# Patient Record
Sex: Male | Born: 1975 | Race: Black or African American | Hispanic: No | Marital: Single | Smoking: Former smoker
Health system: Southern US, Community
[De-identification: ages and names within clinical notes are randomized; demographics above are authoritative.]

## PROBLEM LIST (undated history)

## (undated) DIAGNOSIS — R0600 Dyspnea, unspecified: Secondary | ICD-10-CM

## (undated) DIAGNOSIS — J45909 Unspecified asthma, uncomplicated: Secondary | ICD-10-CM

## (undated) DIAGNOSIS — Z930 Tracheostomy status: Secondary | ICD-10-CM

## (undated) DIAGNOSIS — S2239XA Fracture of one rib, unspecified side, initial encounter for closed fracture: Secondary | ICD-10-CM

## (undated) DIAGNOSIS — C801 Malignant (primary) neoplasm, unspecified: Secondary | ICD-10-CM

## (undated) DIAGNOSIS — J449 Chronic obstructive pulmonary disease, unspecified: Secondary | ICD-10-CM

## (undated) DIAGNOSIS — I10 Essential (primary) hypertension: Secondary | ICD-10-CM

## (undated) DIAGNOSIS — C329 Malignant neoplasm of larynx, unspecified: Secondary | ICD-10-CM

## (undated) DIAGNOSIS — S2249XA Multiple fractures of ribs, unspecified side, initial encounter for closed fracture: Secondary | ICD-10-CM

## (undated) HISTORY — DX: Tracheostomy status: Z93.0

## (undated) HISTORY — DX: Chronic obstructive pulmonary disease, unspecified: J44.9

## (undated) HISTORY — DX: Multiple fractures of ribs, unspecified side, initial encounter for closed fracture: S22.49XA

## (undated) HISTORY — DX: Fracture of one rib, unspecified side, initial encounter for closed fracture: S22.39XA

## (undated) HISTORY — DX: Essential (primary) hypertension: I10

## (undated) HISTORY — PX: NO PAST SURGERIES: SHX2092

## (undated) HISTORY — DX: Malignant neoplasm of larynx, unspecified: C32.9

## (undated) HISTORY — DX: Malignant (primary) neoplasm, unspecified: C80.1

---

## 2009-08-11 ENCOUNTER — Emergency Department (HOSPITAL_COMMUNITY): Admission: EM | Admit: 2009-08-11 | Discharge: 2009-08-11 | Payer: Self-pay | Admitting: Emergency Medicine

## 2009-09-17 ENCOUNTER — Emergency Department (HOSPITAL_BASED_OUTPATIENT_CLINIC_OR_DEPARTMENT_OTHER): Admission: EM | Admit: 2009-09-17 | Discharge: 2009-09-17 | Payer: Self-pay | Admitting: Emergency Medicine

## 2011-04-08 ENCOUNTER — Emergency Department (HOSPITAL_COMMUNITY)
Admission: EM | Admit: 2011-04-08 | Discharge: 2011-04-09 | Disposition: A | Discharge status: Home / Self Care | Payer: Self-pay | Attending: Emergency Medicine | Admitting: Emergency Medicine

## 2011-04-08 DIAGNOSIS — M549 Dorsalgia, unspecified: Secondary | ICD-10-CM | POA: Insufficient documentation

## 2011-04-08 DIAGNOSIS — S21209A Unspecified open wound of unspecified back wall of thorax without penetration into thoracic cavity, initial encounter: Secondary | ICD-10-CM | POA: Insufficient documentation

## 2011-04-09 ENCOUNTER — Emergency Department (HOSPITAL_COMMUNITY): Payer: Self-pay

## 2014-05-19 ENCOUNTER — Encounter (HOSPITAL_COMMUNITY): Payer: Self-pay | Admitting: Emergency Medicine

## 2014-05-19 ENCOUNTER — Emergency Department (HOSPITAL_COMMUNITY): Payer: BC Managed Care – PPO

## 2014-05-19 ENCOUNTER — Emergency Department (HOSPITAL_COMMUNITY)
Admission: EM | Admit: 2014-05-19 | Discharge: 2014-05-19 | Disposition: A | Discharge status: Home / Self Care | Payer: BC Managed Care – PPO | Attending: Emergency Medicine | Admitting: Emergency Medicine

## 2014-05-19 DIAGNOSIS — Z720 Tobacco use: Secondary | ICD-10-CM | POA: Insufficient documentation

## 2014-05-19 DIAGNOSIS — N451 Epididymitis: Secondary | ICD-10-CM | POA: Diagnosis not present

## 2014-05-19 DIAGNOSIS — R1030 Lower abdominal pain, unspecified: Secondary | ICD-10-CM | POA: Diagnosis present

## 2014-05-19 DIAGNOSIS — N50819 Testicular pain, unspecified: Secondary | ICD-10-CM

## 2014-05-19 LAB — CBC WITH DIFFERENTIAL/PLATELET
Basophils Absolute: 0 10*3/uL (ref 0.0–0.1)
Basophils Relative: 1 % (ref 0–1)
Eosinophils Absolute: 0.1 10*3/uL (ref 0.0–0.7)
Eosinophils Relative: 2 % (ref 0–5)
HCT: 45.6 % (ref 39.0–52.0)
Hemoglobin: 16.7 g/dL (ref 13.0–17.0)
Lymphocytes Relative: 49 % — ABNORMAL HIGH (ref 12–46)
Lymphs Abs: 3.7 10*3/uL (ref 0.7–4.0)
MCH: 31.9 pg (ref 26.0–34.0)
MCHC: 36.6 g/dL — ABNORMAL HIGH (ref 30.0–36.0)
MCV: 87.2 fL (ref 78.0–100.0)
Monocytes Absolute: 0.3 10*3/uL (ref 0.1–1.0)
Monocytes Relative: 4 % (ref 3–12)
Neutro Abs: 3.4 10*3/uL (ref 1.7–7.7)
Neutrophils Relative %: 45 % (ref 43–77)
Platelets: 202 10*3/uL (ref 150–400)
RBC: 5.23 MIL/uL (ref 4.22–5.81)
RDW: 13 % (ref 11.5–15.5)
WBC: 7.5 10*3/uL (ref 4.0–10.5)

## 2014-05-19 LAB — BASIC METABOLIC PANEL
Anion gap: 15 (ref 5–15)
BUN: 11 mg/dL (ref 6–23)
CO2: 26 mEq/L (ref 19–32)
Calcium: 9.3 mg/dL (ref 8.4–10.5)
Chloride: 98 mEq/L (ref 96–112)
Creatinine, Ser: 0.7 mg/dL (ref 0.50–1.35)
GFR calc Af Amer: >90 mL/min (ref >90)
GFR calc non Af Amer: >90 mL/min (ref >90)
Glucose, Bld: 98 mg/dL (ref 70–99)
Potassium: 4.4 mEq/L (ref 3.7–5.3)
Sodium: 139 mEq/L (ref 137–147)

## 2014-05-19 LAB — URINALYSIS, ROUTINE W REFLEX MICROSCOPIC
Bilirubin Urine: NEGATIVE
Glucose, UA: NEGATIVE mg/dL
Hgb urine dipstick: NEGATIVE
Ketones, ur: NEGATIVE mg/dL
Leukocytes, UA: NEGATIVE
Nitrite: NEGATIVE
Protein, ur: NEGATIVE mg/dL
Specific Gravity, Urine: 1.006 (ref 1.005–1.030)
Urobilinogen, UA: 0.2 mg/dL (ref 0.0–1.0)
pH: 5.5 (ref 5.0–8.0)

## 2014-05-19 MED ORDER — DOXYCYCLINE HYCLATE 100 MG PO CAPS: 100.0000 mg | ORAL_CAPSULE | Freq: Two times a day (BID) | ORAL | Status: DC

## 2014-05-19 MED ORDER — CEFTRIAXONE SODIUM 250 MG IJ SOLR
250.0000 mg | Freq: Once | INTRAMUSCULAR | Status: AC
  Administered 2014-05-19: 250 mg via INTRAMUSCULAR
  Filled 2014-05-19: qty 250

## 2014-05-19 MED ORDER — KETOROLAC TROMETHAMINE 15 MG/ML IJ SOLN
15.0000 mg | Freq: Once | INTRAMUSCULAR | Status: AC
  Administered 2014-05-19: 15 mg via INTRAVENOUS
  Filled 2014-05-19: qty 1

## 2014-05-19 MED ORDER — LIDOCAINE HCL (PF) 1 % IJ SOLN
0.9000 mL | Freq: Once | INTRAMUSCULAR | Status: AC
  Administered 2014-05-19: 0.9 mL
  Filled 2014-05-19: qty 5

## 2014-05-19 MED ORDER — DOXYCYCLINE HYCLATE 100 MG PO TABS
100.0000 mg | ORAL_TABLET | Freq: Once | ORAL | Status: AC
  Administered 2014-05-19: 100 mg via ORAL
  Filled 2014-05-19: qty 1

## 2014-05-19 NOTE — Discharge Instructions (Signed)
Epididymitis °Epididymitis is a swelling (inflammation) of the epididymis. The epididymis is a cord-like structure along the back part of the testicle. Epididymitis is usually, but not always, caused by infection. This is usually a sudden problem beginning with chills, fever and pain behind the scrotum and in the testicle. There may be swelling and redness of the testicle. °DIAGNOSIS  °Physical examination will reveal a tender, swollen epididymis. Sometimes, cultures are obtained from the urine or from prostate secretions to help find out if there is an infection or if the cause is a different problem. Sometimes, blood work is performed to see if your white blood cell count is elevated and if a germ (bacterial) or viral infection is present. Using this knowledge, an appropriate medicine which kills germs (antibiotic) can be chosen by your caregiver. A viral infection causing epididymitis will most often go away (resolve) without treatment. °HOME CARE INSTRUCTIONS  °· Hot sitz baths for 20 minutes, 4 times per day, may help relieve pain. °· Only take over-the-counter or prescription medicines for pain, discomfort or fever as directed by your caregiver. °· Take all medicines, including antibiotics, as directed. Take the antibiotics for the full prescribed length of time even if you are feeling better. °· It is very important to keep all follow-up appointments. °SEEK IMMEDIATE MEDICAL CARE IF:  °· You have a fever. °· You have pain not relieved with medicines. °· You have any worsening of your problems. °· Your pain seems to come and go. °· You develop pain, redness, and swelling in the scrotum and surrounding areas. °MAKE SURE YOU:  °· Understand these instructions. °· Will watch your condition. °· Will get help right away if you are not doing well or get worse. °Document Released: 07/10/2000 Document Revised: 10/05/2011 Document Reviewed: 05/30/2009 °[employer]® Patient Information ©2015 [employer]. This information  is not intended to replace advice given to you by your health care provider. Make sure you discuss any questions you have with your health care provider. ° °

## 2014-05-19 NOTE — ED Provider Notes (Signed)
CSN: [id number]     Arrival date & time 05/19/14  1543 History   First MD Initiated Contact with Patient 05/19/14 1555     Chief Complaint  Patient presents with  . Groin Pain     (Consider location/radiation/quality/duration/timing/severity/associated sxs/prior Treatment) Patient is a 38 y.o. male presenting with groin pain. The history is provided by the patient. No language interpreter was used.  Groin Pain This is a new problem. Episode onset: 3 days. The problem occurs constantly. The problem has been gradually worsening. Pertinent negatives include no abdominal pain, arthralgias, change in bowel habit, fever, nausea, rash, swollen glands or urinary symptoms. The symptoms are aggravated by walking. He has tried nothing for the symptoms.    History reviewed. No pertinent past medical history. History reviewed. No pertinent past surgical history. History reviewed. No pertinent family history. History  Substance Use Topics  . Smoking status: Current Every Day Smoker    Types: Cigarettes  . Smokeless tobacco: Not on file  . Alcohol Use: Yes    Review of Systems  Constitutional: Negative for fever.  Gastrointestinal: Negative for nausea, abdominal pain, diarrhea, constipation, abdominal distention and change in bowel habit.  Genitourinary: Positive for testicular pain. Negative for dysuria, frequency, hematuria, decreased urine volume, discharge, scrotal swelling, genital sores and penile pain.  Musculoskeletal: Negative for arthralgias and gait problem.  Skin: Negative for color change, rash and wound.  All other systems reviewed and are negative.     Allergies  Review of patient's allergies indicates no known allergies.  Home Medications   Prior to Admission medications   Not on File   BP 132/79  Pulse 90  Temp(Src) 98.3 F (36.8 C)  Resp 16  Ht 5\' 6"  (1.676 m)  Wt 172 lb (78.019 kg)  BMI 27.77 kg/m2  SpO2 98% Physical Exam  Vitals reviewed. Constitutional:  He is oriented to person, place, and time. He appears well-developed and well-nourished.  HENT:  Head: Atraumatic.  Cardiovascular: Normal rate.   Pulmonary/Chest: Effort normal and breath sounds normal.  Abdominal: Soft. He exhibits no distension and no mass. There is no tenderness. Hernia confirmed negative in the right inguinal area and confirmed negative in the left inguinal area.  Genitourinary: Penis normal. Right testis shows no mass, no swelling and no tenderness. Left testis shows swelling and tenderness. Left testis shows no mass. Circumcised.  Equivocal cremasteric bilaterally  Musculoskeletal:       Left hip: Normal.  Lymphadenopathy:       Right: No inguinal adenopathy present.       Left: No inguinal adenopathy present.  Neurological: He is alert and oriented to person, place, and time.  Skin: Skin is warm. No rash noted.    ED Course  Procedures (including critical care time) Labs Review Labs Reviewed  CBC WITH DIFFERENTIAL - Abnormal; Notable for the following:    MCHC 36.6 (*)    Lymphocytes Relative 49 (*)    All other components within normal limits  GC/CHLAMYDIA PROBE AMP  BASIC METABOLIC PANEL  URINALYSIS, ROUTINE W REFLEX MICROSCOPIC    Imaging Review US Scrotum  05/19/2014   CLINICAL DATA:  Left testicular pain x3 days  EXAM: SCROTAL ULTRASOUND  DOPPLER ULTRASOUND OF THE TESTICLES  TECHNIQUE: Complete ultrasound examination of the testicles, epididymis, and other scrotal structures was performed. Color and spectral Doppler ultrasound were also utilized to evaluate blood flow to the testicles.  COMPARISON:  None.  FINDINGS: Right testicle  Measurements: 4.1 x 2.4 x 2.7  cm. No mass or microlithiasis visualized.  Left testicle  Measurements: 4.1 x 2.5 x 3.3 cm. No mass or microlithiasis visualized.  Right epididymis:  Normal in size and appearance.  Left epididymis: Enlarged/heterogeneous, measuring 4.2 x 1.2 x 2.7 cm, with associated hyperemia.  Hydrocele:  None  visualized.  Varicocele:  Present on the left.  Pulsed Doppler interrogation of both testes demonstrates low resistance arterial and venous waveforms bilaterally.  IMPRESSION: Findings compatible with left epididymitis.  No evidence of testicular torsion.  Left varicocele.   Electronically Signed   By: Julian Hy M.D.   On: 05/19/2014 17:10   [place] Art/ven Flow Abd Pelv Doppler  05/19/2014   CLINICAL DATA:  Left testicular pain x3 days  EXAM: SCROTAL ULTRASOUND  DOPPLER ULTRASOUND OF THE TESTICLES  TECHNIQUE: Complete ultrasound examination of the testicles, epididymis, and other scrotal structures was performed. Color and spectral Doppler ultrasound were also utilized to evaluate blood flow to the testicles.  COMPARISON:  None.  FINDINGS: Right testicle  Measurements: 4.1 x 2.4 x 2.7 cm. No mass or microlithiasis visualized.  Left testicle  Measurements: 4.1 x 2.5 x 3.3 cm. No mass or microlithiasis visualized.  Right epididymis:  Normal in size and appearance.  Left epididymis: Enlarged/heterogeneous, measuring 4.2 x 1.2 x 2.7 cm, with associated hyperemia.  Hydrocele:  None visualized.  Varicocele:  Present on the left.  Pulsed Doppler interrogation of both testes demonstrates low resistance arterial and venous waveforms bilaterally.  IMPRESSION: Findings compatible with left epididymitis.  No evidence of testicular torsion.  Left varicocele.   Electronically Signed   By: Julian Hy M.D.   On: 05/19/2014 17:10     EKG Interpretation None      MDM   Final diagnoses:  Testicle pain    38 y/o male with left testicular pain x 3 days. 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 No dysuria or discharge. No high risk sexual activity or concern for STI. Tenderness of posterior testicle. No inguinal hernia palpated. Equivocal cremasteric. Will get labs, testicular US.   [place] with epididymitis, no torsion. Rocephin and doxycycline given.  UA unremarkable. Appropriate for d/c  with PCP f/u. ED return precautions discussed and pt in agreement.   Labs and imaging reviewed in my medical decision making. Pt discussed with my attending, Dr. Regenia Skeeter.   Amparo Bristol, MD 05/20/14 [phone]

## 2014-05-19 NOTE — ED Notes (Signed)
Patient transported to Ultrasound 

## 2014-05-19 NOTE — ED Notes (Signed)
He states hes had sharp L groin pain since Wednesday. Pain increased with movement. Nothing relieves the pain. 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 Denies bowel/bladder changes

## 2014-05-21 LAB — GC/CHLAMYDIA PROBE AMP
CT Probe RNA: NEGATIVE
GC Probe RNA: NEGATIVE

## 2014-05-23 NOTE — ED Provider Notes (Signed)
I saw and evaluated the patient, reviewed the resident's note and I agree with the findings and plan.   EKG Interpretation None       Patient with epididymitis, will treat as if STI with rocephin and doxy. Recommended symptomatic care and f/u.  Ephraim Hamburger, MD 05/23/14 [phone]

## 2014-10-22 ENCOUNTER — Emergency Department (HOSPITAL_BASED_OUTPATIENT_CLINIC_OR_DEPARTMENT_OTHER)
Admission: EM | Admit: 2014-10-22 | Discharge: 2014-10-22 | Disposition: A | Discharge status: Home / Self Care | Payer: BLUE CROSS/BLUE SHIELD | Attending: Emergency Medicine | Admitting: Emergency Medicine

## 2014-10-22 ENCOUNTER — Encounter (HOSPITAL_BASED_OUTPATIENT_CLINIC_OR_DEPARTMENT_OTHER): Payer: Self-pay | Admitting: *Deleted

## 2014-10-22 ENCOUNTER — Emergency Department (HOSPITAL_BASED_OUTPATIENT_CLINIC_OR_DEPARTMENT_OTHER): Payer: BLUE CROSS/BLUE SHIELD

## 2014-10-22 DIAGNOSIS — R52 Pain, unspecified: Secondary | ICD-10-CM

## 2014-10-22 DIAGNOSIS — Z720 Tobacco use: Secondary | ICD-10-CM | POA: Diagnosis not present

## 2014-10-22 DIAGNOSIS — Z792 Long term (current) use of antibiotics: Secondary | ICD-10-CM | POA: Diagnosis not present

## 2014-10-22 DIAGNOSIS — M549 Dorsalgia, unspecified: Secondary | ICD-10-CM | POA: Diagnosis present

## 2014-10-22 DIAGNOSIS — M545 Low back pain: Secondary | ICD-10-CM | POA: Insufficient documentation

## 2014-10-22 MED ORDER — KETOROLAC TROMETHAMINE 30 MG/ML IJ SOLN
60.0000 mg | Freq: Once | INTRAMUSCULAR | Status: AC
  Administered 2014-10-22: 60 mg via INTRAMUSCULAR
  Filled 2014-10-22: qty 2

## 2014-10-22 MED ORDER — OXYCODONE-ACETAMINOPHEN 5-325 MG PO TABS: 2.0000 | ORAL_TABLET | ORAL | Status: DC | PRN

## 2014-10-22 MED ORDER — CYCLOBENZAPRINE HCL 10 MG PO TABS: 10.0000 mg | ORAL_TABLET | Freq: Two times a day (BID) | ORAL | Status: DC | PRN

## 2014-10-22 MED ORDER — IBUPROFEN 600 MG PO TABS: 600.0000 mg | ORAL_TABLET | Freq: Four times a day (QID) | ORAL | Status: DC | PRN

## 2014-10-22 NOTE — ED Provider Notes (Signed)
CSN: [id number]     Arrival date & time 10/22/14  6222 History   First MD Initiated Contact with Patient 10/22/14 [phone]     Chief Complaint  Patient presents with  . Back Pain     HPI Patient presents with 3 four-day history of back pain.  Started after work.  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  Pain is worse with getting up and down and movement.  Patient has no paresthesias or radiation of pain.  Patient denies any urinary or bowel incontinence. History reviewed. No pertinent past medical history. History reviewed. No pertinent past surgical history. History reviewed. No pertinent family history. History  Substance Use Topics  . Smoking status: Current Every Day Smoker    Types: Cigarettes  . Smokeless tobacco: Not on file  . Alcohol Use: Yes    Review of Systems  All other systems reviewed and are negative  Allergies  Review of patient's allergies indicates no known allergies.  Home Medications   Prior to Admission medications   Medication Sig Start Date End Date Taking? Authorizing Provider  cyclobenzaprine (FLEXERIL) 10 MG tablet Take 1 tablet (10 mg total) by mouth 2 (two) times daily as needed for muscle spasms. 10/22/14   Leonard Schwartz, MD  doxycycline (VIBRAMYCIN) 100 MG capsule Take 1 capsule (100 mg total) by mouth 2 (two) times daily. One po bid x 10 days 05/19/14   Sherwood Gambler, MD  ibuprofen (ADVIL,MOTRIN) 600 MG tablet Take 1 tablet (600 mg total) by mouth every 6 (six) hours as needed. 10/22/14   Leonard Schwartz, MD  oxyCODONE-acetaminophen (PERCOCET/ROXICET) 5-325 MG per tablet Take 2 tablets by mouth every 4 (four) hours as needed for severe pain. 10/22/14   Leonard Schwartz, MD   BP 157/98 mmHg  Pulse 94  Temp(Src) 99.2 F (37.3 C) (Oral)  Resp 16  Ht 5' 7.5" (1.715 m)  Wt 187 lb (84.823 kg)  BMI 28.84 kg/m2  SpO2 99% Physical Exam Physical Exam  Nursing note and vitals reviewed. Constitutional: He is oriented to person, place, and time. He  appears well-developed and well-nourished. No distress.  HENT:  Head: Normocephalic and atraumatic.  Eyes: Pupils are equal, round, and reactive to light.  Neck: Normal range of motion.  Cardiovascular: Normal rate and intact distal pulses.   Pulmonary/Chest: No respiratory distress.  Abdominal: Normal appearance. He exhibits no distension.  Musculoskeletal: Tenderness in the lumbosacral region to palpation and movement.  No radiation.  No step-off deformity.   Neurological: He is alert and oriented to person, place, and time. No cranial nerve deficit.  Skin: Skin is warm and dry. No rash noted.  Psychiatric: He has a normal mood and affect. His behavior is normal.   ED Course  Procedures (including critical care time) Labs Review Labs Reviewed - No data to display  Imaging Review Dg Lumbar Spine Complete  10/22/2014   CLINICAL DATA:  Intermittent lumbago. Pain increased after lifting heavy object 3 days prior  EXAM: LUMBAR SPINE - COMPLETE 4+ VIEW  COMPARISON:  None.  FINDINGS: Frontal, lateral, spot lumbosacral lateral, and bilateral oblique views were obtained. The there are 5 non-rib-bearing lumbar type vertebral bodies. There is no fracture or spondylolisthesis. Disc spaces appear intact. There is no appreciable facet arthropathy. Incidental note is made of spina bifida occulta at L5.  IMPRESSION: No fracture or spondylolisthesis.  No appreciable arthropathy.   Electronically Signed   By: Lowella Grip III M.D.   On: 10/22/2014 10:00  MDM   Final diagnoses:  Pain  Low back pain without sciatica, unspecified back pain laterality        Leonard Schwartz, MD 10/22/14 1024

## 2014-10-22 NOTE — Discharge Instructions (Signed)
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
## 2014-10-22 NOTE — ED Notes (Signed)
Patient transported to X-ray 

## 2016-12-07 ENCOUNTER — Emergency Department (HOSPITAL_COMMUNITY): Payer: BLUE CROSS/BLUE SHIELD

## 2016-12-07 ENCOUNTER — Emergency Department (HOSPITAL_COMMUNITY)
Admission: EM | Admit: 2016-12-07 | Discharge: 2016-12-07 | Disposition: A | Discharge status: Home / Self Care | Payer: BLUE CROSS/BLUE SHIELD | Attending: Emergency Medicine | Admitting: Emergency Medicine

## 2016-12-07 ENCOUNTER — Encounter (HOSPITAL_COMMUNITY): Payer: Self-pay | Admitting: Emergency Medicine

## 2016-12-07 DIAGNOSIS — Z79899 Other long term (current) drug therapy: Secondary | ICD-10-CM | POA: Insufficient documentation

## 2016-12-07 DIAGNOSIS — J40 Bronchitis, not specified as acute or chronic: Secondary | ICD-10-CM | POA: Diagnosis not present

## 2016-12-07 DIAGNOSIS — J309 Allergic rhinitis, unspecified: Secondary | ICD-10-CM | POA: Diagnosis not present

## 2016-12-07 DIAGNOSIS — R51 Headache: Secondary | ICD-10-CM | POA: Insufficient documentation

## 2016-12-07 DIAGNOSIS — F17210 Nicotine dependence, cigarettes, uncomplicated: Secondary | ICD-10-CM | POA: Diagnosis not present

## 2016-12-07 DIAGNOSIS — R05 Cough: Secondary | ICD-10-CM | POA: Diagnosis present

## 2016-12-07 LAB — BASIC METABOLIC PANEL
Anion gap: 10 (ref 5–15)
BUN: 17 mg/dL (ref 6–20)
CO2: 26 mmol/L (ref 22–32)
Calcium: 9.3 mg/dL (ref 8.9–10.3)
Chloride: 98 mmol/L — ABNORMAL LOW (ref 101–111)
Creatinine, Ser: 1.04 mg/dL (ref 0.61–1.24)
GFR calc Af Amer: >60 mL/min (ref >60)
GFR calc non Af Amer: >60 mL/min (ref >60)
Glucose, Bld: 90 mg/dL (ref 65–99)
Potassium: 4.4 mmol/L (ref 3.5–5.1)
Sodium: 134 mmol/L — ABNORMAL LOW (ref 135–145)

## 2016-12-07 LAB — CBC
HCT: 48 % (ref 39.0–52.0)
Hemoglobin: 17.4 g/dL — ABNORMAL HIGH (ref 13.0–17.0)
MCH: 32.3 pg (ref 26.0–34.0)
MCHC: 36.3 g/dL — ABNORMAL HIGH (ref 30.0–36.0)
MCV: 89.2 fL (ref 78.0–100.0)
Platelets: 238 10*3/uL (ref 150–400)
RBC: 5.38 MIL/uL (ref 4.22–5.81)
RDW: 13.8 % (ref 11.5–15.5)
WBC: 6.8 10*3/uL (ref 4.0–10.5)

## 2016-12-07 MED ORDER — FLUTICASONE PROPIONATE 50 MCG/ACT NA SUSP: 1.0000 | Freq: Every day | NASAL | 0 refills | Status: DC

## 2016-12-07 MED ORDER — IBUPROFEN 200 MG PO TABS
600.0000 mg | ORAL_TABLET | Freq: Once | ORAL | Status: AC
  Administered 2016-12-07: 600 mg via ORAL
  Filled 2016-12-07: qty 3

## 2016-12-07 MED ORDER — IBUPROFEN 600 MG PO TABS: 600.0000 mg | ORAL_TABLET | Freq: Four times a day (QID) | ORAL | 0 refills | Status: DC | PRN

## 2016-12-07 MED ORDER — CETIRIZINE-PSEUDOEPHEDRINE ER 5-120 MG PO TB12: 1.0000 | ORAL_TABLET | Freq: Every day | ORAL | 0 refills | Status: DC

## 2016-12-07 NOTE — ED Notes (Signed)
Pt verbalizes awakening with headache, generalized cramping at rest, cough that clears in the morning with associated nausea/spitting up saliva/a lot of snot in nose. Pt verbalizes this is ongoing for months.

## 2016-12-07 NOTE — ED Provider Notes (Signed)
[facility] DEPT Provider Note   CSN: [id number] Arrival date & time: 12/07/16  6440     History   Chief Complaint Chief Complaint  Patient presents with  . headache  . Nasal Congestion    HPI Daniel Marshall is a 41 y.o. male.  HPI  41 y.o. male  presents to the Emergency Department today complaining of headache, sinus congestion, cough with reg tinged sputum. Notes x several weeks. Associated headache is in the mornings. Resolves throughout the day without medications. Headache only occurs in the AM. No numbness/tingling. No visual changes. Notes emesis in morning when first waking. None throughout the day. No CP/SOB/ABD pain. No fevers. Pt states that he is able to go to work and cotninue throughout the day without symtoms, but reurnts to home and feels fatigued and cramps up. No other symptoms noted.   History reviewed. No pertinent past medical history.  There are no active problems to display for this patient.   History reviewed. No pertinent surgical history.     Home Medications    Prior to Admission medications   Medication Sig Start Date End Date Taking? Authorizing Provider  cyclobenzaprine (FLEXERIL) 10 MG tablet Take 1 tablet (10 mg total) by mouth 2 (two) times daily as needed for muscle spasms. Patient not taking: Reported on 12/07/2016 10/22/14   Leonard Schwartz, MD  doxycycline (VIBRAMYCIN) 100 MG capsule Take 1 capsule (100 mg total) by mouth 2 (two) times daily. One po bid x 10 days Patient not taking: Reported on 12/07/2016 05/19/14   Sherwood Gambler, MD  ibuprofen (ADVIL,MOTRIN) 600 MG tablet Take 1 tablet (600 mg total) by mouth every 6 (six) hours as needed. Patient not taking: Reported on 12/07/2016 10/22/14   Leonard Schwartz, MD  oxyCODONE-acetaminophen (PERCOCET/ROXICET) 5-325 MG per tablet Take 2 tablets by mouth every 4 (four) hours as needed for severe pain. Patient not taking: Reported on 12/07/2016 10/22/14   Leonard Schwartz, MD    Family History No  family history on file.  Social History Social History  Substance Use Topics  . Smoking status: Current Every Day Smoker    Types: Cigarettes  . Smokeless tobacco: Never Used  . Alcohol use Yes     Allergies   Patient has no known allergies.   Review of Systems Review of Systems ROS reviewed and all are negative for acute change except as noted in the HPI.  Physical Exam Updated Vital Signs BP (!) 152/109 (BP Location: Left Arm)   Pulse 87   Temp 98.7 F (37.1 C) (Oral)   Resp 18   SpO2 98%   Physical Exam  Constitutional: He is oriented to person, place, and time. He appears well-developed and well-nourished. No distress.  HENT:  Head: Normocephalic and atraumatic.  Right Ear: Tympanic membrane, external ear and ear canal normal.  Left Ear: Tympanic membrane, external ear and ear canal normal.  Nose: Nose normal.  Mouth/Throat: Uvula is midline, oropharynx is clear and moist and mucous membranes are normal. No trismus in the jaw. No oropharyngeal exudate, posterior oropharyngeal erythema or tonsillar abscesses.  Eyes: EOM are normal. Pupils are equal, round, and reactive to light.  Neck: Normal range of motion. Neck supple. No tracheal deviation present.  Cardiovascular: Normal rate, regular rhythm, S1 normal, S2 normal, normal heart sounds, intact distal pulses and normal pulses.   Pulmonary/Chest: Effort normal and breath sounds normal. No respiratory distress. He has no decreased breath sounds. He has no wheezes. He has no rhonchi. He has  no rales.  Abdominal: Normal appearance and bowel sounds are normal. There is no tenderness.  Musculoskeletal: Normal range of motion.  Neurological: He is alert and oriented to person, place, and time.  Skin: Skin is warm and dry.  Psychiatric: He has a normal mood and affect. His speech is normal and behavior is normal. Thought content normal.  Nursing note and vitals reviewed.  ED Treatments / Results  Labs (all labs ordered  are listed, but only abnormal results are displayed) Labs Reviewed  CBC - Abnormal; Notable for the following:       Result Value   Hemoglobin 17.4 (*)    MCHC 36.3 (*)    All other components within normal limits  BASIC METABOLIC PANEL    EKG  EKG Interpretation None       Radiology No results found.  Procedures Procedures (including critical care time)  Medications Ordered in ED Medications - No data to display   Initial Impression / Assessment and Plan / ED Course  I have reviewed the triage vital signs and the nursing notes.  Pertinent labs & imaging results that were available during my care of the patient were reviewed by me and considered in my medical decision making (see chart for details).  Final Clinical Impressions(s) / ED Diagnoses  {I have reviewed and evaluated the relevant laboratory values. {I have reviewed and evaluated the relevant imaging studies.  {I have reviewed the relevant previous healthcare records.  {I obtained HPI from historian.   ED Course:  Assessment: Pt is a 41 y.o. male presents with headache, sinus congestion, and productive sputum with hemoptysis x several weeks. No recent travel. Notes smoking 1 pack every two days of cigarrettes. No CP/SOB. No N/V. No fevers. No meds PTA. On exam, pt in NAD. VSS. Afebrile. Lungs CTA, Heart RRR. Abdomen nontender/soft. Labs unremarkable. Pt CXR negative for acute infiltrate. Likely combination of allergic sinusitis causing headache. Patient is without high-risk features of headache including: Sudden onset/thunderclap HA, No similar headache in past, Altered mental status, Accompanying seizure, Headache with exertion, Age > 50, History of immunocompromise, Neck or shoulder pain, Fever, Use of anticoagulation, Family history of spontaneous SAH, Concomitant drug use, Toxic exposure. Patient has a normal complete neurological exam, normal vital signs, normal level of consciousness. Seems to resolve as day  progresses and only occurs in AM. Hemoptysis likely related to smoking. Encouraged smoking cessation. Lungs were CTA. Doubt PE as pt without pleuritic pain, Doubt PE. Pt will be discharged with symptomatic treatment.  Verbalizes understanding and is agreeable with plan. Pt is hemodynamically stable & in NAD prior to dc.  Disposition/Plan:  DC Home Additional Verbal discharge instructions given and discussed with patient.  Pt Instructed to f/u with PCP in the next week for evaluation and treatment of symptoms. Return precautions given Pt acknowledges and agrees with plan  Supervising Physician Gareth Morgan, MD  Final diagnoses:  Allergic sinusitis  Bronchitis    New Prescriptions New Prescriptions   No medications on file     Shary Decamp, Hershal Coria 12/07/16 1425    Gareth Morgan, MD 12/09/16 1306
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
## 2016-12-07 NOTE — ED Triage Notes (Signed)
Patient states for couple months now every morning he wakes up he has headaches. Then having coughing up lots of saliva and here lately he had red streaks in it. Patient states that after he first drinks in the mornings it comes right back up. Then he goes to work and works all day then when gets home and does things around the house he cramps up. Patient reports that he drinks lots of water during the day while working.

## 2017-05-05 ENCOUNTER — Encounter: Payer: Self-pay | Admitting: Family Medicine

## 2017-05-05 ENCOUNTER — Ambulatory Visit (INDEPENDENT_AMBULATORY_CARE_PROVIDER_SITE_OTHER): Payer: BLUE CROSS/BLUE SHIELD | Admitting: Family Medicine

## 2017-05-05 VITALS — BP 136/86 | HR 88 | Temp 98.9°F | Resp 14 | Ht 67.5 in | Wt 181.2 lb

## 2017-05-05 DIAGNOSIS — Z1329 Encounter for screening for other suspected endocrine disorder: Secondary | ICD-10-CM

## 2017-05-05 DIAGNOSIS — Z136 Encounter for screening for cardiovascular disorders: Secondary | ICD-10-CM

## 2017-05-05 DIAGNOSIS — R252 Cramp and spasm: Secondary | ICD-10-CM | POA: Diagnosis not present

## 2017-05-05 DIAGNOSIS — Z1321 Encounter for screening for nutritional disorder: Secondary | ICD-10-CM | POA: Diagnosis not present

## 2017-05-05 DIAGNOSIS — Z131 Encounter for screening for diabetes mellitus: Secondary | ICD-10-CM

## 2017-05-05 MED ORDER — RANITIDINE HCL 150 MG PO TABS: 150.0000 mg | ORAL_TABLET | Freq: Two times a day (BID) | ORAL | 0 refills | Status: DC

## 2017-05-05 MED ORDER — ALBUTEROL SULFATE HFA 108 (90 BASE) MCG/ACT IN AERS: 2.0000 | INHALATION_SPRAY | RESPIRATORY_TRACT | 1 refills | Status: DC | PRN

## 2017-05-05 NOTE — Patient Instructions (Signed)
Nice meeting you!   You will be notified of any abnormal labs and any additional treatment indicated.  Pick-up medications from pharmacy.    Chronic Obstructive Pulmonary Disease Chronic obstructive pulmonary disease (COPD) is a long-term (chronic) lung problem. When you have COPD, it is hard for air to get in and out of your lungs. The way your lungs work will never return to normal. Usually the condition gets worse over time. There are things you can do to keep yourself as healthy as possible. Your doctor may treat your condition with:  Medicines.  Quitting smoking, if you smoke.  Rehabilitation. This may involve a team of specialists.  Oxygen.  Exercise and changes to your diet.  Lung surgery.  Comfort measures (palliative care).  Follow these instructions at home: Medicines  Take over-the-counter and prescription medicines only as told by your doctor.  Talk to your doctor before taking any cough or allergy medicines. You may need to avoid medicines that cause your lungs to be dry. Lifestyle  If you smoke, stop. Smoking makes the problem worse. If you need help quitting, ask your doctor.  Avoid being around things that make your breathing worse. This may include smoke, chemicals, and fumes.  Stay active, but remember to also rest.  Learn and use tips on how to relax.  Make sure you get enough sleep. Most adults need at least 7 hours a night.  Eat healthy foods. Eat smaller meals more often. Rest before meals. Controlled breathing  Learn and use tips on how to control your breathing as told by your doctor. Try: ? Breathing in (inhaling) through your nose for 1 second. Then, pucker your lips and breath out (exhale) through your lips for 2 seconds. ? Putting one hand on your belly (abdomen). Breathe in slowly through your nose for 1 second. Your hand on your belly should move out. Pucker your lips and breathe out slowly through your lips. Your hand on your belly should  move in as you breathe out. Controlled coughing  Learn and use controlled coughing to clear mucus from your lungs. The steps are: 1. Lean your head a little forward. 2. Breathe in deeply. 3. Try to hold your breath for 3 seconds. 4. Keep your mouth slightly open while coughing 2 times. 5. Spit any mucus out into a tissue. 6. Rest and do the steps again 1 or 2 times as needed. General instructions  Make sure you get all the shots (vaccines) that your doctor recommends. Ask your doctor about a flu shot and a pneumonia shot.  Use oxygen therapy and therapy to help improve your lungs (pulmonary rehabilitation) if told by your doctor. If you need home oxygen therapy, ask your doctor if you should buy a tool to measure your oxygen level (oximeter).  Make a COPD action plan with your doctor. This helps you know what to do if you feel worse than usual.  Manage any other conditions you have as told by your doctor.  Avoid going outside when it is very hot, cold, or humid.  Avoid people who have a sickness you can catch (contagious).  Keep all follow-up visits as told by your doctor. This is important. Contact a doctor if:  You cough up more mucus than usual.  There is a change in the color or thickness of the mucus.  It is harder to breathe than usual.  Your breathing is faster than usual.  You have trouble sleeping.  You need to use your medicines  more often than usual.  You have trouble doing your normal activities such as getting dressed or walking around the house. Get help right away if:  You have shortness of breath while resting.  You have shortness of breath that stops you from: ? Being able to talk. ? Doing normal activities.  Your chest hurts for longer than 5 minutes.  Your skin color is more blue than usual.  Your pulse oximeter shows that you have low oxygen for longer than 5 minutes.  You have a fever.  You feel too tired to breathe  normally. Summary  Chronic obstructive pulmonary disease (COPD) is a long-term lung problem.  The way your lungs work will never return to normal. Usually the condition gets worse over time. There are things you can do to keep yourself as healthy as possible.  Take over-the-counter and prescription medicines only as told by your doctor.  If you smoke, stop. Smoking makes the problem worse. This information is not intended to replace advice given to you by your health care provider. Make sure you discuss any questions you have with your health care provider. Document Released: 12/30/2007 Document Revised: 12/19/2015 Document Reviewed: 03/09/2013 Elsevier Interactive Patient Education  2017 [place] Risks of Smoking Smoking cigarettes is very bad for your health. Tobacco smoke has over 200 known poisons in it. It contains the poisonous gases nitrogen oxide and carbon monoxide. There are over 60 chemicals in tobacco smoke that cause cancer. Smoking is difficult to quit because a chemical in tobacco, called nicotine, causes addiction or dependence. When you smoke and inhale, nicotine is absorbed rapidly into the bloodstream through your lungs. Both inhaled and non-inhaled nicotine may be addictive. What are the risks of cigarette smoke? Cigarette smokers have an increased risk of many serious medical problems, including:  Lung cancer.  Lung disease, such as pneumonia, bronchitis, and emphysema.  Chest pain (angina) and heart attack because the heart is not getting enough oxygen.  Heart disease and peripheral blood vessel disease.  High blood pressure (hypertension).  Stroke.  Oral cancer, including cancer of the lip, mouth, or voice box.  Bladder cancer.  Pancreatic cancer.  Cervical cancer.  Pregnancy complications, including premature birth.  Stillbirths and smaller newborn babies, birth defects, and genetic damage to sperm.  Early menopause.  Lower estrogen  level for women.  Infertility.  Facial wrinkles.  Blindness.  Increased risk of broken bones (fractures).  Senile dementia.  Stomach ulcers and internal bleeding.  Delayed wound healing and increased risk of complications during surgery.  Even smoking lightly shortens your life expectancy by several years.  Because of secondhand smoke exposure, children of smokers have an increased risk of the following:  Sudden infant death syndrome (SIDS).  Respiratory infections.  Lung cancer.  Heart disease.  Ear infections.  What are the benefits of quitting? There are many health benefits of quitting smoking. Here are some of them:  Within days of quitting smoking, your risk of having a heart attack decreases, your blood flow improves, and your lung capacity improves. Blood pressure, pulse rate, and breathing patterns start returning to normal soon after quitting.  Within months, your lungs may clear up completely.  Quitting for 10 years reduces your risk of developing lung cancer and heart disease to almost that of a nonsmoker.  People who quit may see an improvement in their overall quality of life.  How do I quit smoking? Smoking is an addiction with both physical and psychological effects,  and longtime habits can be hard to change. Your health care provider can recommend:  Programs and community resources, which may include group support, education, or talk therapy.  Prescription medicines to help reduce cravings.  Nicotine replacement products, such as patches, gum, and nasal sprays. Use these products only as directed. Do not replace cigarette smoking with electronic cigarettes, which are commonly called e-cigarettes. The safety of e-cigarettes is not known, and some may contain harmful chemicals.  A combination of two or more of these methods.  Where to find more information:  American Lung Association: www.lung.org  American Cancer Society:  www.cancer.org Summary  Smoking cigarettes is very bad for your health. Cigarette smokers have an increased risk of many serious medical problems, including several cancers, heart disease, and stroke.  Smoking is an addiction with both physical and psychological effects, and longtime habits can be hard to change.  By stopping right away, you can greatly reduce the risk of medical problems for you and your family.  To help you quit smoking, your health care provider can recommend programs, community resources, prescription medicines, and nicotine replacement products such as patches, gum, and nasal sprays. This information is not intended to replace advice given to you by your health care provider. Make sure you discuss any questions you have with your health care provider. Document Released: 08/20/2004 Document Revised: 07/17/2016 Document Reviewed: 07/17/2016 Elsevier Interactive Patient Education  2017 [employer].

## 2017-05-05 NOTE — Progress Notes (Signed)
Patient ID: Daniel Marshall, male    DOB: 1976-01-20, 41 y.o.   MRN: [id number]  PCP: Scot Jun, FNP  Chief Complaint  Patient presents with  . Establish Care  . Cough  . cramps all over body    Subjective:  HPI Daniel Marshall is a 41 y.o. male presents for establish care . He complains of cramps all over body intermittently at least once day. Drinks lots of water, intakes minimal salt. He has no extremity weakness. Denies chest palpitation. Wake up in the morning with excessive mucus production which causes him to cough and expectorate. He also complains of  abdominal upset occurring almost daily in the mornings. No associated constipation, diarrhea, throat pain, or heartburn. He is current every day smoker and denies any associated wheezing or shortness of breath. Social History   Social History  . Marital status: Single    Spouse name: N/A  . Number of children: N/A  . Years of education: N/A   Occupational History  . Not on file.   Social History Main Topics  . Smoking status: Current Every Day Smoker    Types: Cigarettes  . Smokeless tobacco: Never Used  . Alcohol use Yes  . Drug use: No  . Sexual activity: Not on file   Other Topics Concern  . Not on file   Social History Narrative  . No narrative on file    Family History  Problem Relation Age of Onset  . Family history unknown: Yes   Review of Systems  See history of present illness No Known Allergies  Prior to Admission medications   Medication Sig Start Date End Date Taking? Authorizing Provider  cetirizine-pseudoephedrine (ZYRTEC-D) 5-120 MG tablet Take 1 tablet by mouth daily. Patient not taking: Reported on 05/05/2017 12/07/16   Shary Decamp, PA-C  cyclobenzaprine (FLEXERIL) 10 MG tablet Take 1 tablet (10 mg total) by mouth 2 (two) times daily as needed for muscle spasms. Patient not taking: Reported on 12/07/2016 10/22/14   Leonard Schwartz, MD  fluticasone [facility]) 50 MCG/ACT nasal spray Place 1  spray into both nostrils daily. Patient not taking: Reported on 05/05/2017 12/07/16   Shary Decamp, PA-C  ibuprofen (ADVIL,MOTRIN) 600 MG tablet Take 1 tablet (600 mg total) by mouth every 6 (six) hours as needed. Patient not taking: Reported on 05/05/2017 12/07/16   Shary Decamp, PA-C  oxyCODONE-acetaminophen (PERCOCET/ROXICET) 5-325 MG per tablet Take 2 tablets by mouth every 4 (four) hours as needed for severe pain. Patient not taking: Reported on 12/07/2016 10/22/14   Leonard Schwartz, MD    Past Medical, Surgical Family and Social History reviewed and updated.    Objective:   Today's Vitals   05/05/17 1408  BP: 136/86  Pulse: 88  Resp: 14  Temp: 98.9 F (37.2 C)  TempSrc: Oral  SpO2: 98%  Weight: 181 lb 3.2 oz (82.2 kg)  Height: 5' 7.5" (1.715 m)    Wt Readings from Last 3 Encounters:  05/05/17 181 lb 3.2 oz (82.2 kg)  10/22/14 187 lb (84.8 kg)  05/19/14 172 lb (78 kg)   Physical Exam  Constitutional: He is oriented to person, place, and time. He appears well-developed and well-nourished.  HENT:  Head: Normocephalic and atraumatic.  Nose: Nose normal.  Mouth/Throat: Oropharynx is clear and moist.  Eyes: Pupils are equal, round, and reactive to light. Conjunctivae and EOM are normal.  Neck: Normal range of motion. Neck supple. No thyromegaly present.  Cardiovascular: Normal rate, regular rhythm, normal heart sounds  and intact distal pulses.   Pulmonary/Chest: Effort normal and breath sounds normal.  Abdominal: Soft. Bowel sounds are normal. He exhibits no distension. There is no tenderness.  Musculoskeletal: Normal range of motion.  Lymphadenopathy:    He has no cervical adenopathy.  Neurological: He is alert and oriented to person, place, and time.  Skin: Skin is warm and dry.  Psychiatric: He has a normal mood and affect. His behavior is normal. Judgment and thought content normal.   Assessment & Plan:  1. Cramping of hands-sedimentation rate 2. Encounter for vitamin  deficiency-  mag, phosphorus, CMP,Vitamin D, vitamin B12 level 3. Screening for thyroid disorder-Check a thyroid panel 4. Screening for cardiovascular condition- EKG 12-Lead, negative ischemia or dysrhythmia  5. Screening for diabetes mellitus-- Hemoglobin A1c   Orders Placed This Encounter  Procedures  . Sedimentation rate  . CBC with Differential  . COMPLETE METABOLIC PANEL WITH GFR  . Thyroid Panel With TSH  . Magnesium  . Phosphorus  . VITAMIN D 25 Hydroxy (Vit-D Deficiency, Fractures)  . Vitamin B12  . Hemoglobin A1c  . EKG 12-Lead    Meds ordered this encounter  Medications  . albuterol (PROVENTIL HFA;VENTOLIN HFA) 108 (90 Base) MCG/ACT inhaler    Sig: Inhale 2 puffs into the lungs every 4 (four) hours as needed for wheezing or shortness of breath (cough, shortness of breath or wheezing.).    Dispense:  1 Inhaler    Refill:  1    Order Specific Question:   Supervising Provider    Answer:   Tresa Garter [id number]  . ranitidine (ZANTAC) 150 MG tablet    Sig: Take 1 tablet (150 mg total) by mouth 2 (two) times daily.    Dispense:  60 tablet    Refill:  0    Order Specific Question:   Supervising Provider    Answer:   Tresa Garter [[id number]]    Return for 6 months  Carroll Sage. Kenton Kingfisher, MSN, FNP-C [facility]  [address] Cower [place], [place] [address] [phone]

## 2017-05-06 LAB — COMPLETE METABOLIC PANEL WITH GFR
AG Ratio: 0.8 (calc) — ABNORMAL LOW (ref 1.0–2.5)
ALT: 81 U/L — ABNORMAL HIGH (ref 9–46)
AST: 127 U/L — ABNORMAL HIGH (ref 10–40)
Albumin: 3.4 g/dL — ABNORMAL LOW (ref 3.6–5.1)
Alkaline phosphatase (APISO): 164 U/L — ABNORMAL HIGH (ref 40–115)
BUN: 17 mg/dL (ref 7–25)
CO2: 26 mmol/L (ref 20–32)
Calcium: 8.8 mg/dL (ref 8.6–10.3)
Chloride: 97 mmol/L — ABNORMAL LOW (ref 98–110)
Creat: 0.93 mg/dL (ref 0.60–1.35)
GFR, Est African American: 118 mL/min/{1.73_m2} (ref >=60)
GFR, Est Non African American: 102 mL/min/{1.73_m2} (ref >=60)
Globulin: 4.3 g/dL (calc) — ABNORMAL HIGH (ref 1.9–3.7)
Glucose, Bld: 90 mg/dL (ref 65–99)
Potassium: 5 mmol/L (ref 3.5–5.3)
Sodium: 132 mmol/L — ABNORMAL LOW (ref 135–146)
Total Bilirubin: 1.5 mg/dL — ABNORMAL HIGH (ref 0.2–1.2)
Total Protein: 7.7 g/dL (ref 6.1–8.1)

## 2017-05-06 LAB — CBC WITH DIFFERENTIAL/PLATELET
Basophils Absolute: 98 cells/uL (ref 0–200)
Basophils Relative: 1.6 %
Eosinophils Absolute: 189 cells/uL (ref 15–500)
Eosinophils Relative: 3.1 %
HCT: 46.2 % (ref 38.5–50.0)
Hemoglobin: 16 g/dL (ref 13.2–17.1)
Lymphs Abs: 2739 cells/uL (ref 850–3900)
MCH: 31.4 pg (ref 27.0–33.0)
MCHC: 34.6 g/dL (ref 32.0–36.0)
MCV: 90.8 fL (ref 80.0–100.0)
MPV: 10.3 fL (ref 7.5–12.5)
Monocytes Relative: 10.5 %
Neutro Abs: 2434 cells/uL (ref 1500–7800)
Neutrophils Relative %: 39.9 %
Platelets: 193 10*3/uL (ref 140–400)
RBC: 5.09 10*6/uL (ref 4.20–5.80)
RDW: 13.3 % (ref 11.0–15.0)
Total Lymphocyte: 44.9 %
WBC mixed population: 641 cells/uL (ref 200–950)
WBC: 6.1 10*3/uL (ref 3.8–10.8)

## 2017-05-06 LAB — VITAMIN B12: Vitamin B-12: 742 pg/mL (ref 200–1100)

## 2017-05-06 LAB — HEMOGLOBIN A1C
Hgb A1c MFr Bld: 5 % of total Hgb (ref <5.7)
Mean Plasma Glucose: 97 (calc)
eAG (mmol/L): 5.4 (calc)

## 2017-05-06 LAB — THYROID PANEL WITH TSH
Free Thyroxine Index: 1.8 (ref 1.4–3.8)
T3 Uptake: 38 % — ABNORMAL HIGH (ref 22–35)
T4, Total: 4.8 ug/dL — ABNORMAL LOW (ref 4.9–10.5)
TSH: 1.87 mIU/L (ref 0.40–4.50)

## 2017-05-06 LAB — PHOSPHORUS: Phosphorus: 3.3 mg/dL (ref 2.5–4.5)

## 2017-05-06 LAB — VITAMIN D 25 HYDROXY (VIT D DEFICIENCY, FRACTURES): Vit D, 25-Hydroxy: 12 ng/mL — ABNORMAL LOW (ref 30–100)

## 2017-05-06 LAB — SEDIMENTATION RATE: Sed Rate: 14 mm/h (ref 0–15)

## 2017-05-06 LAB — MAGNESIUM: Magnesium: 1.7 mg/dL (ref 1.5–2.5)

## 2017-05-09 ENCOUNTER — Telehealth: Payer: Self-pay | Admitting: Family Medicine

## 2017-05-09 DIAGNOSIS — R7989 Other specified abnormal findings of blood chemistry: Secondary | ICD-10-CM

## 2017-05-09 DIAGNOSIS — R945 Abnormal results of liver function studies: Principal | ICD-10-CM

## 2017-05-09 MED ORDER — VITAMIN D (ERGOCALCIFEROL) 1.25 MG (50000 UNIT) PO CAPS: 50000.0000 [IU] | ORAL_CAPSULE | ORAL | 3 refills | Status: DC

## 2017-05-09 NOTE — Telephone Encounter (Signed)
Advise patient that his vitamin D level is low and I am prescribing replacement therapy vitamin D 50,000 once weekly x 12 weeks.   Carroll Sage. Kenton Kingfisher, MSN, FNP-C [facility]  [address] C[address] [place], [place] [address] [phone]

## 2017-05-09 NOTE — Telephone Encounter (Signed)
Contact patient to advise his liver enzymes is elevated. Advise that he should avoid alcohol and acetaminophen containing products.  Schedule a abdominal RUQ ultrasound. Also I would like patient to return in two weeks to have liver enzymes repeated. Please have him scheduled for lab visit.  Carroll Sage. Kenton Kingfisher, MSN, FNP-C [facility]  [address] C[address] [place], [place] [address] [phone]

## 2017-05-10 ENCOUNTER — Telehealth: Payer: Self-pay

## 2017-05-10 ENCOUNTER — Encounter: Payer: Self-pay | Admitting: Family Medicine

## 2017-05-10 MED ORDER — VITAMIN D (ERGOCALCIFEROL) 1.25 MG (50000 UNIT) PO CAPS: 50000.0000 [IU] | ORAL_CAPSULE | ORAL | 3 refills | Status: DC

## 2017-05-10 NOTE — Telephone Encounter (Signed)
Patient notified of results and will wait to get a call for the [place]

## 2017-05-11 NOTE — Telephone Encounter (Signed)
Patient notified

## 2017-05-13 ENCOUNTER — Ambulatory Visit (HOSPITAL_COMMUNITY): Admission: RE | Admit: 2017-05-13 | Payer: BLUE CROSS/BLUE SHIELD | Source: Ambulatory Visit

## 2017-05-18 ENCOUNTER — Ambulatory Visit (HOSPITAL_COMMUNITY): Payer: BLUE CROSS/BLUE SHIELD

## 2017-05-19 ENCOUNTER — Emergency Department (HOSPITAL_COMMUNITY): Payer: BLUE CROSS/BLUE SHIELD

## 2017-05-19 ENCOUNTER — Ambulatory Visit (HOSPITAL_COMMUNITY)
Admission: RE | Admit: 2017-05-19 | Discharge: 2017-05-19 | Disposition: A | Discharge status: Home / Self Care | Payer: BLUE CROSS/BLUE SHIELD | Source: Ambulatory Visit | Attending: Family Medicine | Admitting: Family Medicine

## 2017-05-19 ENCOUNTER — Other Ambulatory Visit: Payer: Self-pay

## 2017-05-19 ENCOUNTER — Emergency Department (HOSPITAL_COMMUNITY)
Admission: EM | Admit: 2017-05-19 | Discharge: 2017-05-19 | Disposition: A | Discharge status: Home / Self Care | Payer: BLUE CROSS/BLUE SHIELD | Attending: Emergency Medicine | Admitting: Emergency Medicine

## 2017-05-19 ENCOUNTER — Encounter (HOSPITAL_COMMUNITY): Payer: Self-pay | Admitting: Emergency Medicine

## 2017-05-19 DIAGNOSIS — R0602 Shortness of breath: Secondary | ICD-10-CM | POA: Diagnosis present

## 2017-05-19 DIAGNOSIS — R945 Abnormal results of liver function studies: Principal | ICD-10-CM

## 2017-05-19 DIAGNOSIS — J069 Acute upper respiratory infection, unspecified: Secondary | ICD-10-CM | POA: Diagnosis not present

## 2017-05-19 DIAGNOSIS — K760 Fatty (change of) liver, not elsewhere classified: Secondary | ICD-10-CM | POA: Insufficient documentation

## 2017-05-19 DIAGNOSIS — R05 Cough: Secondary | ICD-10-CM | POA: Insufficient documentation

## 2017-05-19 DIAGNOSIS — R7989 Other specified abnormal findings of blood chemistry: Secondary | ICD-10-CM

## 2017-05-19 HISTORY — DX: Unspecified asthma, uncomplicated: J45.909

## 2017-05-19 LAB — CBC WITH DIFFERENTIAL/PLATELET
Basophils Absolute: 0 10*3/uL (ref 0.0–0.1)
Basophils Relative: 1 %
Eosinophils Absolute: 0.2 10*3/uL (ref 0.0–0.7)
Eosinophils Relative: 3 %
HCT: 43.3 % (ref 39.0–52.0)
Hemoglobin: 15.6 g/dL (ref 13.0–17.0)
Lymphocytes Relative: 43 %
Lymphs Abs: 2.8 10*3/uL (ref 0.7–4.0)
MCH: 32.7 pg (ref 26.0–34.0)
MCHC: 36 g/dL (ref 30.0–36.0)
MCV: 90.8 fL (ref 78.0–100.0)
Monocytes Absolute: 0.4 10*3/uL (ref 0.1–1.0)
Monocytes Relative: 6 %
Neutro Abs: 3.2 10*3/uL (ref 1.7–7.7)
Neutrophils Relative %: 49 %
Platelets: 200 10*3/uL (ref 150–400)
RBC: 4.77 MIL/uL (ref 4.22–5.81)
RDW: 13.6 % (ref 11.5–15.5)
WBC: 6.5 10*3/uL (ref 4.0–10.5)

## 2017-05-19 LAB — COMPREHENSIVE METABOLIC PANEL
ALT: 48 U/L (ref 17–63)
AST: 76 U/L — ABNORMAL HIGH (ref 15–41)
Albumin: 3.2 g/dL — ABNORMAL LOW (ref 3.5–5.0)
Alkaline Phosphatase: 123 U/L (ref 38–126)
Anion gap: 11 (ref 5–15)
BUN: 13 mg/dL (ref 6–20)
CO2: 23 mmol/L (ref 22–32)
Calcium: 8.6 mg/dL — ABNORMAL LOW (ref 8.9–10.3)
Chloride: 99 mmol/L — ABNORMAL LOW (ref 101–111)
Creatinine, Ser: 0.68 mg/dL (ref 0.61–1.24)
GFR calc Af Amer: >60 mL/min (ref >60)
GFR calc non Af Amer: >60 mL/min (ref >60)
Glucose, Bld: 97 mg/dL (ref 65–99)
Potassium: 4.8 mmol/L (ref 3.5–5.1)
Sodium: 133 mmol/L — ABNORMAL LOW (ref 135–145)
Total Bilirubin: 1.2 mg/dL (ref 0.3–1.2)
Total Protein: 7.5 g/dL (ref 6.5–8.1)

## 2017-05-19 LAB — ETHANOL: Alcohol, Ethyl (B): 96 mg/dL — ABNORMAL HIGH (ref <10)

## 2017-05-19 MED ORDER — DOXYCYCLINE HYCLATE 100 MG PO CAPS: 100.0000 mg | ORAL_CAPSULE | Freq: Two times a day (BID) | ORAL | 0 refills | Status: DC

## 2017-05-19 MED ORDER — ALBUTEROL SULFATE HFA 108 (90 BASE) MCG/ACT IN AERS
2.0000 | INHALATION_SPRAY | RESPIRATORY_TRACT | Status: DC | PRN
  Administered 2017-05-19: 2 via RESPIRATORY_TRACT
  Filled 2017-05-19: qty 6.7

## 2017-05-19 MED ORDER — PREDNISONE 20 MG PO TABS: 40.0000 mg | ORAL_TABLET | Freq: Every day | ORAL | 0 refills | Status: DC

## 2017-05-19 MED ORDER — PREDNISONE 20 MG PO TABS
60.0000 mg | ORAL_TABLET | Freq: Once | ORAL | Status: AC
  Administered 2017-05-19: 60 mg via ORAL
  Filled 2017-05-19: qty 3

## 2017-05-19 NOTE — ED Provider Notes (Signed)
[facility] DEPT Provider Note   CSN: [id number] Arrival date & time: 05/19/17  0725     History   Chief Complaint Chief Complaint  Patient presents with  . Shortness of Breath  . Cough    HPI Daniel Marshall is a 41 y.o. male.  HPI Patient presents with shortness of breath and cough. Has had it for the last "minute". Has reportedly seen his primary care doctor for a couple weeks ago. States that she got a test was done today in the hospital. Michela Pitcher the put some gel on his abdomen for it. Appears to be a right upper quadrant ultrasound for elevated LFTs. Has had a cough and states his chest feels tight. He has coughed up some spit. No fevers. He smokes cigarettes. History of asthma when he was a child states he outgrew it. States his chest feels tight. No fevers or chills. No swelling in his legs. does drink alcohol. Has dull chest pain at times with the coughing. Past Medical History:  Diagnosis Date  . Asthma    as a child    There are no active problems to display for this patient.   History reviewed. No pertinent surgical history.     Home Medications    Prior to Admission medications   Medication Sig Start Date End Date Taking? Authorizing Provider  DM-Doxylamine-Acetaminophen (VICKS NYQUIL COLD & FLU) 15-6.25-325 MG CAPS Take 1 capsule by mouth once.   Yes [provider]  Vitamin D, Ergocalciferol, (DRISDOL) 50000 units CAPS capsule Take 1 capsule (50,000 Units total) by mouth every 7 (seven) days. 05/10/17  Yes Scot Jun, FNP  albuterol (PROVENTIL HFA;VENTOLIN HFA) 108 (90 Base) MCG/ACT inhaler Inhale 2 puffs into the lungs every 4 (four) hours as needed for wheezing or shortness of breath (cough, shortness of breath or wheezing.). Patient not taking: Reported on 05/19/2017 05/05/17   Scot Jun, FNP  doxycycline (VIBRAMYCIN) 100 MG capsule Take 1 capsule (100 mg total) by mouth 2 (two) times daily. 05/19/17    Davonna Belling, MD  fluticasone (FLONASE) 50 MCG/ACT nasal spray Place 1 spray into both nostrils daily. Patient not taking: Reported on 05/05/2017 12/07/16   Shary Decamp, PA-C  predniSONE (DELTASONE) 20 MG tablet Take 2 tablets (40 mg total) by mouth daily. 05/20/17   Davonna Belling, MD  ranitidine (ZANTAC) 150 MG tablet Take 1 tablet (150 mg total) by mouth 2 (two) times daily. Patient not taking: Reported on 05/19/2017 05/05/17   Scot Jun, FNP    Family History Family History  Problem Relation Age of Onset  . Family history unknown: Yes    Social History Social History  Substance Use Topics  . Smoking status: Current Every Day Smoker    Types: Cigarettes  . Smokeless tobacco: Never Used  . Alcohol use Yes     Allergies   Patient has no known allergies.   Review of Systems Review of Systems  Constitutional: Negative for appetite change.  HENT: Positive for congestion.   Respiratory: Positive for chest tightness and shortness of breath.   Cardiovascular: Positive for chest pain.  Gastrointestinal: Negative for abdominal pain.  Genitourinary: Negative for flank pain.  Musculoskeletal: Negative for back pain.  Neurological: Negative for weakness and numbness.  Hematological: Negative for adenopathy.  Psychiatric/Behavioral: Negative for confusion.     Physical Exam Updated Vital Signs BP (!) 139/92 (BP Location: Right Arm)   Pulse 91   Temp 99 F (37.2 C) (Oral)  Resp 18   Ht 5\' 7"  (1.702 m)   Wt 82.1 kg (181 lb)   SpO2 97%   BMI 28.35 kg/m   Physical Exam  Constitutional: He appears well-developed.  HENT:  Head: Atraumatic.  Eyes: Pupils are equal, round, and reactive to light.  Neck: Neck supple.  Cardiovascular: Normal rate.   Pulmonary/Chest: Effort normal. He has no wheezes. He has no rales.  Abdominal: Soft. There is no tenderness.  Musculoskeletal: He exhibits no edema.  Neurological: He is alert.  Skin: Skin is warm. Capillary  refill takes less than 2 seconds.     ED Treatments / Results  Labs (all labs ordered are listed, but only abnormal results are displayed) Labs Reviewed  ETHANOL - Abnormal; Notable for the following:       Result Value   Alcohol, Ethyl (B) 96 (*)    All other components within normal limits  COMPREHENSIVE METABOLIC PANEL - Abnormal; Notable for the following:    Sodium 133 (*)    Chloride 99 (*)    Calcium 8.6 (*)    Albumin 3.2 (*)    AST 76 (*)    All other components within normal limits  CBC WITH DIFFERENTIAL/PLATELET    EKG  EKG Interpretation None       Radiology Dg Chest 2 View  Result Date: 05/19/2017 CLINICAL DATA:  Cough and shortness breath for 1 week. EXAM: CHEST  2 VIEW COMPARISON:  12/07/2016 FINDINGS: The heart size and mediastinal contours are within normal limits. Both lungs are clear. No evidence of pneumothorax or pleural effusion. The visualized skeletal structures are unremarkable. IMPRESSION: Negative.  No active cardiopulmonary disease. Electronically Signed   By: Earle Gell M.D.   On: 05/19/2017 08:59   US Abdomen Limited Ruq  Result Date: 05/19/2017 CLINICAL DATA:  Elevated liver function tests. EXAM: ULTRASOUND ABDOMEN LIMITED RIGHT UPPER QUADRANT COMPARISON:  None. FINDINGS: Gallbladder: No gallstones or wall thickening visualized. No sonographic Murphy sign noted by sonographer. Common bile duct: Diameter: 4 mm, within normal limits. Liver: Diffusely increased echogenicity of the hepatic parenchyma, consistent with hepatic steatosis. No focal mass lesion identified. Portal vein is patent on color Doppler imaging with normal direction of blood flow towards the liver. IMPRESSION: No evidence gallstones or biliary dilatation. Hepatic steatosis.  No hepatic mass identified. Electronically Signed   By: Earle Gell M.D.   On: 05/19/2017 08:10    Procedures Procedures (including critical care time)  Medications Ordered in ED Medications  predniSONE  (DELTASONE) tablet 60 mg (not administered)  albuterol (PROVENTIL HFA;VENTOLIN HFA) 108 (90 Base) MCG/ACT inhaler 2 puff (not administered)     Initial Impression / Assessment and Plan / ED Course  I have reviewed the triage vital signs and the nursing notes.  Pertinent labs & imaging results that were available during my care of the patient were reviewed by me and considered in my medical decision making (see chart for details).     Patient with cough and URI symptoms.  Has had it for the last months.  X-ray reassuring.  However with previous history of asthma and the prolonged course of the symptoms we will treat with steroids and antibiotics.  Will follow up with his PCP.  LFTs have improved.  Given inhaler Here.  Final Clinical Impressions(s) / ED Diagnoses   Final diagnoses:  Upper respiratory tract infection, unspecified type    New Prescriptions New Prescriptions   DOXYCYCLINE (VIBRAMYCIN) 100 MG CAPSULE    Take 1 capsule (  100 mg total) by mouth 2 (two) times daily.   PREDNISONE (DELTASONE) 20 MG TABLET    Take 2 tablets (40 mg total) by mouth daily.     Davonna Belling, MD 05/19/17 1330

## 2017-05-19 NOTE — ED Triage Notes (Signed)
Pt complaint of "can't breathe; I'm all stopped up;" complaint continued nasal congestion and cough for a month.

## 2017-05-20 ENCOUNTER — Telehealth: Payer: Self-pay | Admitting: Family Medicine

## 2017-05-20 NOTE — Telephone Encounter (Signed)
Contact patient to advise his recent liver ultrasound was consistent with fatty liver disease likely cause by unhealthy diet and alcohol use. His recent labs from the emergency department showed improvement of liver enzymes. He should reduce intake of fat rich foods and avoid alcohol use to improve liver function. I would like to see him in office sooner than his next scheduled appointment. Have him schedule a follow-up for mid-January.   Carroll Sage. Kenton Kingfisher, MSN, FNP-C [facility]  [address] [place], [place] [address] [phone]

## 2017-05-21 ENCOUNTER — Other Ambulatory Visit (INDEPENDENT_AMBULATORY_CARE_PROVIDER_SITE_OTHER): Payer: BLUE CROSS/BLUE SHIELD

## 2017-05-21 DIAGNOSIS — R7989 Other specified abnormal findings of blood chemistry: Secondary | ICD-10-CM

## 2017-05-21 DIAGNOSIS — R945 Abnormal results of liver function studies: Secondary | ICD-10-CM

## 2017-05-21 NOTE — Telephone Encounter (Signed)
Patient notified of results and will schedule appointment in January.

## 2017-05-27 LAB — HEPATITIS B SURFACE ANTIBODY, QUANTITATIVE: Hepatitis B-Post: <5 m[IU]/mL — ABNORMAL LOW (ref >=10)

## 2017-05-27 LAB — HEPATITIS A ANTIBODY, TOTAL: Hepatitis A AB,Total: NONREACTIVE

## 2017-05-27 LAB — HEPATITIS B SURFACE ANTIGEN: Hepatitis B Surface Ag: NONREACTIVE

## 2017-05-27 LAB — HEPATITIS C VRS RNA DETECT BY PCR-QUAL: Hepatitis C Vrs RNA by PCR-Qual: NOT DETECTED

## 2017-08-11 ENCOUNTER — Ambulatory Visit (INDEPENDENT_AMBULATORY_CARE_PROVIDER_SITE_OTHER): Payer: BLUE CROSS/BLUE SHIELD | Admitting: Family Medicine

## 2017-08-11 ENCOUNTER — Ambulatory Visit (HOSPITAL_COMMUNITY)
Admission: RE | Admit: 2017-08-11 | Discharge: 2017-08-11 | Disposition: A | Discharge status: Home / Self Care | Payer: BLUE CROSS/BLUE SHIELD | Source: Ambulatory Visit | Attending: Family Medicine | Admitting: Family Medicine

## 2017-08-11 ENCOUNTER — Encounter: Payer: Self-pay | Admitting: Family Medicine

## 2017-08-11 VITALS — BP 128/86 | HR 90 | Temp 97.9°F | Resp 16 | Ht 67.0 in | Wt 184.0 lb

## 2017-08-11 DIAGNOSIS — R05 Cough: Secondary | ICD-10-CM

## 2017-08-11 DIAGNOSIS — Z1389 Encounter for screening for other disorder: Secondary | ICD-10-CM

## 2017-08-11 DIAGNOSIS — R059 Cough, unspecified: Secondary | ICD-10-CM

## 2017-08-11 DIAGNOSIS — J9801 Acute bronchospasm: Secondary | ICD-10-CM

## 2017-08-11 LAB — POCT URINALYSIS DIP (DEVICE)
Bilirubin Urine: NEGATIVE
Glucose, UA: NEGATIVE mg/dL
Hgb urine dipstick: NEGATIVE
Ketones, ur: NEGATIVE mg/dL
Leukocytes, UA: NEGATIVE
Nitrite: NEGATIVE
Protein, ur: NEGATIVE mg/dL
Specific Gravity, Urine: 1.015 (ref 1.005–1.030)
Urobilinogen, UA: 1 mg/dL (ref 0.0–1.0)
pH: 6 (ref 5.0–8.0)

## 2017-08-11 NOTE — Progress Notes (Signed)
Patient ID: Daniel Marshall, male    DOB: 11/28/1975, 42 y.o.   MRN: [id number]  PCP: Scot Jun, FNP  Chief Complaint  Patient presents with  . Back Pain    hurts when he coughs  . Flank Pain    Subjective:  HPI Daniel Marshall is a 42 y.o. male, current smoker and history of bronchitis, presents today with a complaint of thoracic pain with coughing. Onset of symptoms two weeks prior. He reports initially experiencing chest tightness with coughing. Cough is non productive and he report intermittent wheezing and shortness of breath. He experienced similar symptoms back in October, when he was treated for URI and prescribed antibiotics. Reports today that he never picked up antibiotic medication. Denies fever, headache, nasal congestion, or sore throat. He has an albuterol inhaler, although has not used it. He continues to smoke in spite of symptoms.  Social History   Socioeconomic History  . Marital status: Single    Spouse name: Not on file  . Number of children: Not on file  . Years of education: Not on file  . Highest education level: Not on file  Social Needs  . Financial resource strain: Not on file  . Food insecurity - worry: Not on file  . Food insecurity - inability: Not on file  . Transportation needs - medical: Not on file  . Transportation needs - non-medical: Not on file  Occupational History  . Not on file  Tobacco Use  . Smoking status: Current Every Day Smoker    Types: Cigarettes  . Smokeless tobacco: Never Used  Substance and Sexual Activity  . Alcohol use: Yes  . Drug use: No  . Sexual activity: Not on file  Other Topics Concern  . Not on file  Social History Narrative  . Not on file    Family History  Family history: Patient denies any known family history of cardiovascular disease or  lung disease,   Review of Systems  HENT: Negative.   Eyes: Negative.   Respiratory: Positive for cough, chest tightness, shortness of breath and wheezing.    Cardiovascular: Negative.   Musculoskeletal: Positive for arthralgias.  Skin: Negative.   Psychiatric/Behavioral: Negative.    No Known Allergies  Prior to Admission medications   Medication Sig Start Date End Date Taking? Authorizing Provider  albuterol (PROVENTIL HFA;VENTOLIN HFA) 108 (90 Base) MCG/ACT inhaler Inhale 2 puffs into the lungs every 4 (four) hours as needed for wheezing or shortness of breath (cough, shortness of breath or wheezing.). 05/05/17  Yes Scot Jun, FNP  fluticasone (FLONASE) 50 MCG/ACT nasal spray Place 1 spray into both nostrils daily. 12/07/16  Yes Shary Decamp, PA-C  predniSONE (DELTASONE) 20 MG tablet Take 2 tablets (40 mg total) by mouth daily. 05/20/17  Yes Davonna Belling, MD  ranitidine (ZANTAC) 150 MG tablet Take 1 tablet (150 mg total) by mouth 2 (two) times daily. 05/05/17  Yes Scot Jun, FNP  Vitamin D, Ergocalciferol, (DRISDOL) 50000 units CAPS capsule Take 1 capsule (50,000 Units total) by mouth every 7 (seven) days. 05/10/17  Yes Scot Jun, FNP  DM-Doxylamine-Acetaminophen (VICKS NYQUIL COLD & FLU) 15-6.25-325 MG CAPS Take 1 capsule by mouth once.    [provider]  doxycycline (VIBRAMYCIN) 100 MG capsule Take 1 capsule (100 mg total) by mouth 2 (two) times daily. Patient not taking: Reported on 08/11/2017 05/19/17   Davonna Belling, MD    Past Medical, Surgical Family and Social History reviewed and updated.  Objective:   Today's Vitals   08/11/17 1523  BP: 128/86  Pulse: 90  Resp: 16  Temp: 97.9 F (36.6 C)  TempSrc: Oral  SpO2: 99%  Weight: 184 lb (83.5 kg)  Height: 5\' 7"  (1.702 m)    Wt Readings from Last 3 Encounters:  08/11/17 184 lb (83.5 kg)  05/19/17 181 lb (82.1 kg)  05/05/17 181 lb 3.2 oz (82.2 kg)    Physical Exam  Constitutional: He is oriented to person, place, and time. He appears well-developed and well-nourished.  HENT:  Head: Normocephalic and atraumatic.  Right Ear:  External ear normal.  Left Ear: External ear normal.  Nose: Nose normal.  Mouth/Throat: Oropharynx is clear and moist.  Eyes: Conjunctivae are normal. Pupils are equal, round, and reactive to light.  Neck: Normal range of motion. Neck supple.  Cardiovascular: Normal rate, regular rhythm, normal heart sounds and intact distal pulses.  Pulmonary/Chest: Effort normal. He has decreased breath sounds. He has no wheezes. He has no rhonchi.  Musculoskeletal: Normal range of motion.  Neurological: He is alert and oriented to person, place, and time.  Skin: Skin is warm and dry.  Psychiatric: He has a normal mood and affect. His behavior is normal. Judgment and thought content normal.   Assessment & Plan:  1. Coughing, 2. Bronchospasm Likely secondary to chronic smoking history and possible COPD. Chest, back , rib pain secondary to persistent coughing. Obtaining a chest x-ray to rule out any pneumonia processes.  Encouraged use of albuterol inhaler for shortness of breath, wheezing, coughing every 4-6 hours 2 puffs as needed. Will trial a short-course of prednisone 40 mg x 5. If symptoms worsen or do not improve, return for care.  Orders Placed This Encounter  Procedures  . DG Chest 2 View  . POCT urinalysis dip (device)    Carroll Sage. Kenton Kingfisher, MSN, FNP-C [facility]  [address] [place], [place] [address] [phone]

## 2017-08-11 NOTE — Patient Instructions (Signed)
Bronchospasm, Adult Bronchospasm is a tightening of the airways going into the lungs. During an episode, it may be harder to breathe. You may cough, and you may make a whistling sound when you breathe (wheeze). This condition often affects people with asthma. What are the causes? This condition is caused by swelling and irritation in the airways. It can be triggered by:  An infection (common).  Seasonal allergies.  An allergic reaction.  Exercise.  Irritants. These include pollution, cigarette smoke, strong odors, aerosol sprays, and paint fumes.  Weather changes. Winds increase molds and pollens in the air. Cold air may cause swelling.  Stress and emotional upset.  What are the signs or symptoms? Symptoms of this condition include:  Wheezing. If the episode was triggered by an allergy, wheezing may start right away or hours later.  Nighttime coughing.  Frequent or severe coughing with a simple cold.  Chest tightness.  Shortness of breath.  Decreased ability to exercise.  How is this diagnosed? This condition is usually diagnosed with a review of your medical history and a physical exam. Tests, such as lung function tests, are sometimes done to look for other conditions. The need for a chest X-ray depends on where the wheezing occurs and whether it is the first time you have wheezed. How is this treated? This condition may be treated with:  Inhaled medicines. These open up the airways and help you breathe. They can be taken with an inhaler or a nebulizer device.  Corticosteroid medicines. These may be given for severe bronchospasm, usually when it is associated with asthma.  Avoiding triggers, such as irritants, infection, or allergies.  Follow these instructions at home: Medicines  Take over-the-counter and prescription medicines only as told by your health care provider.  If you need to use an inhaler or nebulizer to take your medicine, ask your health care  provider to explain how to use it correctly. If you were given a spacer, always use it with your inhaler. Lifestyle  Reduce the number of triggers in your home. To do this: ? Change your heating and air conditioning filter at least once a month. ? Limit your use of fireplaces and wood stoves. ? Do not smoke. Do not allow smoking in your home. ? Avoid using perfumes and fragrances. ? Get rid of pests, such as roaches and mice, and their droppings. ? Remove any mold from your home. ? Keep your house clean and dust free. Use unscented cleaning products. ? Replace carpet with wood, tile, or vinyl flooring. Carpet can trap dander and dust. ? Use allergy-proof pillows, mattress covers, and box spring covers. ? Wash bed sheets and blankets every week in hot water. Dry them in a dryer. ? Use blankets that are made of polyester or cotton. ? Wash your hands often. ? Do not allow pets in your bedroom.  Avoid breathing in cold air when you exercise. General instructions  Have a plan for seeking medical care. Know when to call your health care provider and local emergency services, and where to get emergency care.  Stay up to date on your immunizations.  When you have an episode of bronchospasm, stay calm. Try to relax and breathe more slowly.  If you have asthma, make sure you have an asthma action plan.  Keep all follow-up visits as told by your health care provider. This is important. Contact a health care provider if:  You have muscle aches.  You have chest pain.  The mucus that you   cough up (sputum) changes from clear or white to yellow, green, gray, or bloody.  You have a fever.  Your sputum gets thicker. Get help right away if:  Your wheezing and coughing get worse, even after you take your prescribed medicines.  It gets even harder to breathe.  You develop severe chest pain. Summary  Bronchospasm is a tightening of the airways going into the lungs.  During an episode of  bronchospasm, you may have a harder time breathing. You may cough and make a whistling sound when you breathe (wheeze).  Avoid exposure to triggers such as smoke, dust, mold, animal dander, and fragrances.  When you have an episode of bronchospasm, stay calm. Try to relax and breathe more slowly. This information is not intended to replace advice given to you by your health care provider. Make sure you discuss any questions you have with your health care provider. Document Released: 07/16/2003 Document Revised: 07/09/2016 Document Reviewed: 07/09/2016 Elsevier Interactive Patient Education  2017 Elsevier Inc.  

## 2017-08-12 ENCOUNTER — Telehealth: Payer: Self-pay | Admitting: Family Medicine

## 2017-08-12 NOTE — Telephone Encounter (Signed)
Left a detail vm for patient  

## 2017-08-12 NOTE — Telephone Encounter (Signed)
Patient notified of results.

## 2017-08-12 NOTE — Telephone Encounter (Signed)
Contact patient to advise that his chest x-ray was normal.  Complete all medication as recommended.  If symptoms worsen or do not improve within 2 weeks return for care.  Daniel Marshall. Kenton Kingfisher, MSN, FNP-C [facility]  [address] [place], [place] [address] [phone]

## 2017-08-16 DIAGNOSIS — R05 Cough: Secondary | ICD-10-CM | POA: Insufficient documentation

## 2017-08-16 DIAGNOSIS — R059 Cough, unspecified: Secondary | ICD-10-CM | POA: Insufficient documentation

## 2017-08-16 DIAGNOSIS — J9801 Acute bronchospasm: Secondary | ICD-10-CM | POA: Insufficient documentation

## 2017-10-07 NOTE — Telephone Encounter (Signed)
Medication refilled

## 2017-11-03 ENCOUNTER — Ambulatory Visit: Payer: BLUE CROSS/BLUE SHIELD | Admitting: Family Medicine

## 2017-11-12 ENCOUNTER — Ambulatory Visit: Payer: BLUE CROSS/BLUE SHIELD | Admitting: Family Medicine

## 2017-11-25 ENCOUNTER — Encounter: Payer: Self-pay | Admitting: Family Medicine

## 2017-11-25 ENCOUNTER — Ambulatory Visit (INDEPENDENT_AMBULATORY_CARE_PROVIDER_SITE_OTHER): Payer: BLUE CROSS/BLUE SHIELD | Admitting: Family Medicine

## 2017-11-25 VITALS — BP 132/78 | HR 98 | Temp 99.7°F | Resp 16 | Ht 67.0 in | Wt 180.0 lb

## 2017-11-25 DIAGNOSIS — R0989 Other specified symptoms and signs involving the circulatory and respiratory systems: Secondary | ICD-10-CM | POA: Diagnosis not present

## 2017-11-25 DIAGNOSIS — R252 Cramp and spasm: Secondary | ICD-10-CM

## 2017-11-25 DIAGNOSIS — Z114 Encounter for screening for human immunodeficiency virus [HIV]: Secondary | ICD-10-CM | POA: Diagnosis not present

## 2017-11-25 DIAGNOSIS — F17200 Nicotine dependence, unspecified, uncomplicated: Secondary | ICD-10-CM | POA: Diagnosis not present

## 2017-11-25 DIAGNOSIS — J9801 Acute bronchospasm: Secondary | ICD-10-CM

## 2017-11-25 NOTE — Patient Instructions (Signed)
We discussed your muscle cramps at length, will obtain labs and follow-up by phone with results.  Also, for abnormal right upper lung auscultation will obtain a stat chest x-ray and follow-up after reviewing results. I recommend that you decrease your smoking.  I will provide information for Chantix and nicotine patches for your review.  We can follow-up by phone to discuss options further.

## 2017-11-25 NOTE — Progress Notes (Signed)
Subjective:    Patient ID: Daniel Marshall, male    DOB: September 20, 1975, 42 y.o.   MRN: [id number]  HPI A 42 year old male with a history of childhood asthma and tobacco dependence presents complaining of productive cough with blood-tinged sputum and muscle cramps.  Patient states that productive cough has been occurring over the past several months.  He endorses blood-tinged sputum occasionally.  Patient denies fever, headache, shortness of breath, or wheezing.  He was prescribed an albuterol inhaler several months ago, but is not use medication.  He has not attempted any over-the-counter interventions to alleviate current symptoms.  Patient is a chronic everyday tobacco user.  He typically smokes 1 pack/day.  He is attempt to quit smoking in the past without success. Patient is also complaining of periodic muscle cramps.  He states that cramps occur primarily after work.  He works a very physical job and typically sweats profusely.  Patient hydrates infrequently.  He has attempted sports drinks for this problem without relief.  Past Medical History:  Diagnosis Date  . Asthma    as a child   There is no immunization history on file for this patient. Social History   Socioeconomic History  . Marital status: Single    Spouse name: Not on file  . Number of children: Not on file  . Years of education: Not on file  . Highest education level: Not on file  Occupational History  . Not on file  Social Needs  . Financial resource strain: Not on file  . Food insecurity:    Worry: Not on file    Inability: Not on file  . Transportation needs:    Medical: Not on file    Non-medical: Not on file  Tobacco Use  . Smoking status: Current Every Day Smoker    Types: Cigarettes  . Smokeless tobacco: Never Used  Substance and Sexual Activity  . Alcohol use: Yes  . Drug use: No  . Sexual activity: Not on file  Lifestyle  . Physical activity:    Days per week: Not on file    Minutes per session: Not  on file  . Stress: Not on file  Relationships  . Social connections:    Talks on phone: Not on file    Gets together: Not on file    Attends religious service: Not on file    Active member of club or organization: Not on file    Attends meetings of clubs or organizations: Not on file    Relationship status: Not on file  . Intimate partner violence:    Fear of current or ex partner: Not on file    Emotionally abused: Not on file    Physically abused: Not on file    Forced sexual activity: Not on file  Other Topics Concern  . Not on file  Social History Narrative  . Not on file   No Known Allergies Allergies as of 11/25/2017   No Known Allergies     Medication List        Accurate as of 11/25/17  4:17 PM. Always use your most recent med list.          albuterol 108 (90 Base) MCG/ACT inhaler Commonly known as:  PROVENTIL HFA;VENTOLIN HFA Inhale 2 puffs into the lungs every 4 (four) hours as needed for wheezing or shortness of breath (cough, shortness of breath or wheezing.).   fluticasone 50 MCG/ACT nasal spray Commonly known as:  FLONASE Place 1 spray into  both nostrils daily.   predniSONE 20 MG tablet Commonly known as:  DELTASONE Take 2 tablets (40 mg total) by mouth daily.   ranitidine 150 MG tablet Commonly known as:  ZANTAC Take 1 tablet (150 mg total) by mouth 2 (two) times daily.   Vitamin D (Ergocalciferol) 50000 units Caps capsule Commonly known as:  DRISDOL Take 1 capsule (50,000 Units total) by mouth every 7 (seven) days.      Review of Systems  Constitutional: Negative for fatigue, fever and unexpected weight change.  HENT: Positive for congestion.   Eyes: Negative.   Respiratory: Positive for cough. Negative for apnea and shortness of breath.   Cardiovascular: Negative.   Endocrine: Negative.   Genitourinary: Negative.   Musculoskeletal: Negative.   Hematological: Negative.   Psychiatric/Behavioral: Negative.        Objective:   Physical  Exam  Constitutional: He is oriented to person, place, and time. He appears well-developed and well-nourished.  Eyes: Pupils are equal, round, and reactive to light.  Neck: Normal range of motion.  Cardiovascular: Normal rate, regular rhythm, normal heart sounds and intact distal pulses.  Pulmonary/Chest: Effort normal. He has rhonchi in the right upper field.  Abdominal: Soft. Normal appearance and bowel sounds are normal.  Neurological: He is alert and oriented to person, place, and time.  Skin: Skin is warm and dry.  Psychiatric: He has a normal mood and affect. His behavior is normal. Thought content normal.      BP 132/78 (BP Location: Left Arm, Patient Position: Sitting, Cuff Size: Normal) Comment: manually  Pulse 98   Temp 99.7 F (37.6 C) (Oral)   Resp 16   Ht 5\' 7"  (1.702 m)   Wt 180 lb (81.6 kg)   SpO2 98%   BMI 28.19 kg/m     Assessment & Plan:  Bronchospasm We will follow-up by phone after reviewing chest x-ray.  Will discuss treatment plan further at that time.  May warrant referral to pulmonology for further work-up and evaluation. - DG Chest 2 View; Future  Rhonchi - DG Chest 2 View; Future  Muscle cramps  - Basic Metabolic Panel - CBC with Differential  Tobacco dependence Patient is not interested in smoking cessation at this time. Smoking cessation instruction/counseling given:  counseled patient on the dangers of tobacco use, advised patient to stop smoking, and reviewed strategies to maximize success  Screening for HIV (human immunodeficiency virus) - HIV antibody (with reflex)   RTC: Will follow up by phone with chest xray results  Donia Pounds  MSN, FNP-C [facility] [address] [place], [place] [address] [phone]

## 2017-11-26 ENCOUNTER — Ambulatory Visit (HOSPITAL_COMMUNITY)
Admission: RE | Admit: 2017-11-26 | Discharge: 2017-11-26 | Disposition: A | Discharge status: Home / Self Care | Payer: BLUE CROSS/BLUE SHIELD | Source: Ambulatory Visit | Attending: Family Medicine | Admitting: Family Medicine

## 2017-11-26 DIAGNOSIS — R0989 Other specified symptoms and signs involving the circulatory and respiratory systems: Secondary | ICD-10-CM | POA: Insufficient documentation

## 2017-11-26 DIAGNOSIS — J9801 Acute bronchospasm: Secondary | ICD-10-CM | POA: Diagnosis not present

## 2017-11-26 LAB — CBC WITH DIFFERENTIAL/PLATELET
Basophils Absolute: 0.1 10*3/uL (ref 0.0–0.2)
Basos: 1 %
EOS (ABSOLUTE): 0.1 10*3/uL (ref 0.0–0.4)
Eos: 2 %
Hematocrit: 44.2 % (ref 37.5–51.0)
Hemoglobin: 15.3 g/dL (ref 13.0–17.7)
Immature Grans (Abs): 0 10*3/uL (ref 0.0–0.1)
Immature Granulocytes: 0 %
Lymphocytes Absolute: 3.1 10*3/uL (ref 0.7–3.1)
Lymphs: 37 %
MCH: 32.7 pg (ref 26.6–33.0)
MCHC: 34.6 g/dL (ref 31.5–35.7)
MCV: 94 fL (ref 79–97)
Monocytes Absolute: 0.6 10*3/uL (ref 0.1–0.9)
Monocytes: 7 %
Neutrophils Absolute: 4.4 10*3/uL (ref 1.4–7.0)
Neutrophils: 53 %
Platelets: 159 10*3/uL (ref 150–379)
RBC: 4.68 x10E6/uL (ref 4.14–5.80)
RDW: 15 % (ref 12.3–15.4)
WBC: 8.2 10*3/uL (ref 3.4–10.8)

## 2017-11-26 LAB — BASIC METABOLIC PANEL
BUN/Creatinine Ratio: 16 (ref 9–20)
BUN: 15 mg/dL (ref 6–24)
CO2: 22 mmol/L (ref 20–29)
Calcium: 8.9 mg/dL (ref 8.7–10.2)
Chloride: 98 mmol/L (ref 96–106)
Creatinine, Ser: 0.92 mg/dL (ref 0.76–1.27)
GFR calc Af Amer: 118 mL/min/{1.73_m2} (ref >59)
GFR calc non Af Amer: 102 mL/min/{1.73_m2} (ref >59)
Glucose: 90 mg/dL (ref 65–99)
Potassium: 4.1 mmol/L (ref 3.5–5.2)
Sodium: 135 mmol/L (ref 134–144)

## 2017-11-26 LAB — HIV ANTIBODY (ROUTINE TESTING W REFLEX): HIV Screen 4th Generation wRfx: NONREACTIVE

## 2017-11-29 ENCOUNTER — Telehealth: Payer: Self-pay

## 2017-11-29 NOTE — Telephone Encounter (Signed)
-----   Message from Dorena Dew, [place] sent at 11/27/2017  6:31 AM EDT ----- Regarding: lab results Please inform patient that chest xray and labs are unremarkable, no additional medications warranted. Also, all laboratory tests are negative. Recommend smoking cessation at this point. Continue to titrate cigarette use. Utilize albuterol inhaler every 6 hours as needed for shortness of breath, wheezing, or persistent coughing.  Also, recommend replenishing electrolytes after work with sports drinks (Gatorade or power aid) due to overall physicality of job.   If symptoms persist, please follow up for further work up and evaluation.   Donia Pounds  MSN, FNP-C [facility] [address] [place], [place] [address] [phone]

## 2017-11-29 NOTE — Telephone Encounter (Signed)
Left a vm for patient to callback 

## 2017-12-01 MED ORDER — ALBUTEROL SULFATE HFA 108 (90 BASE) MCG/ACT IN AERS: 2.0000 | INHALATION_SPRAY | RESPIRATORY_TRACT | 1 refills | Status: DC | PRN

## 2017-12-01 NOTE — Telephone Encounter (Signed)
Left a vm for patient to callback 

## 2017-12-01 NOTE — Telephone Encounter (Signed)
-----   Message from Dorena Dew, [place] sent at 11/27/2017  6:31 AM EDT ----- Regarding: lab results Please inform patient that chest xray and labs are unremarkable, no additional medications warranted. Also, all laboratory tests are negative. Recommend smoking cessation at this point. Continue to titrate cigarette use. Utilize albuterol inhaler every 6 hours as needed for shortness of breath, wheezing, or persistent coughing.  Also, recommend replenishing electrolytes after work with sports drinks (Gatorade or power aid) due to overall physicality of job.   If symptoms persist, please follow up for further work up and evaluation.   Donia Pounds  MSN, FNP-C [facility] [address] [place], [place] [address] [phone]

## 2017-12-01 NOTE — Telephone Encounter (Signed)
Patient notified and will pick up inhaler

## 2017-12-01 NOTE — Telephone Encounter (Signed)
-----   Message from Dorena Dew, [place] sent at 11/27/2017  6:31 AM EDT ----- Regarding: lab results Please inform patient that chest xray and labs are unremarkable, no additional medications warranted. Also, all laboratory tests are negative. Recommend smoking cessation at this point. Continue to titrate cigarette use. Utilize albuterol inhaler every 6 hours as needed for shortness of breath, wheezing, or persistent coughing.  Also, recommend replenishing electrolytes after work with sports drinks (Gatorade or power aid) due to overall physicality of job.   If symptoms persist, please follow up for further work up and evaluation.   Donia Pounds  MSN, FNP-C [facility] [address] [place], [place] [address] [phone]

## 2018-01-18 ENCOUNTER — Encounter (HOSPITAL_COMMUNITY): Payer: Self-pay | Admitting: Emergency Medicine

## 2018-01-18 ENCOUNTER — Emergency Department (HOSPITAL_COMMUNITY)
Admission: EM | Admit: 2018-01-18 | Discharge: 2018-01-18 | Disposition: A | Discharge status: Home / Self Care | Payer: BLUE CROSS/BLUE SHIELD | Attending: Emergency Medicine | Admitting: Emergency Medicine

## 2018-01-18 ENCOUNTER — Emergency Department (HOSPITAL_COMMUNITY): Payer: BLUE CROSS/BLUE SHIELD

## 2018-01-18 DIAGNOSIS — J45909 Unspecified asthma, uncomplicated: Secondary | ICD-10-CM | POA: Insufficient documentation

## 2018-01-18 DIAGNOSIS — Y9389 Activity, other specified: Secondary | ICD-10-CM | POA: Insufficient documentation

## 2018-01-18 DIAGNOSIS — Z79899 Other long term (current) drug therapy: Secondary | ICD-10-CM | POA: Insufficient documentation

## 2018-01-18 DIAGNOSIS — Z23 Encounter for immunization: Secondary | ICD-10-CM | POA: Insufficient documentation

## 2018-01-18 DIAGNOSIS — S0101XA Laceration without foreign body of scalp, initial encounter: Secondary | ICD-10-CM

## 2018-01-18 DIAGNOSIS — S0121XA Laceration without foreign body of nose, initial encounter: Secondary | ICD-10-CM

## 2018-01-18 DIAGNOSIS — Y92009 Unspecified place in unspecified non-institutional (private) residence as the place of occurrence of the external cause: Secondary | ICD-10-CM | POA: Diagnosis not present

## 2018-01-18 DIAGNOSIS — T148XXA Other injury of unspecified body region, initial encounter: Secondary | ICD-10-CM | POA: Insufficient documentation

## 2018-01-18 DIAGNOSIS — F17210 Nicotine dependence, cigarettes, uncomplicated: Secondary | ICD-10-CM | POA: Diagnosis not present

## 2018-01-18 DIAGNOSIS — Y998 Other external cause status: Secondary | ICD-10-CM | POA: Diagnosis not present

## 2018-01-18 DIAGNOSIS — S022XXA Fracture of nasal bones, initial encounter for closed fracture: Secondary | ICD-10-CM

## 2018-01-18 LAB — BASIC METABOLIC PANEL
Anion gap: 20 — ABNORMAL HIGH (ref 5–15)
BUN: 10 mg/dL (ref 6–20)
CO2: 17 mmol/L — ABNORMAL LOW (ref 22–32)
Calcium: 8.3 mg/dL — ABNORMAL LOW (ref 8.9–10.3)
Chloride: 96 mmol/L — ABNORMAL LOW (ref 98–111)
Creatinine, Ser: 1.09 mg/dL (ref 0.61–1.24)
GFR calc Af Amer: >60 mL/min (ref >60)
GFR calc non Af Amer: >60 mL/min (ref >60)
Glucose, Bld: 100 mg/dL — ABNORMAL HIGH (ref 70–99)
Potassium: 4 mmol/L (ref 3.5–5.1)
Sodium: 133 mmol/L — ABNORMAL LOW (ref 135–145)

## 2018-01-18 LAB — CBC WITH DIFFERENTIAL/PLATELET
Abs Immature Granulocytes: 0.1 10*3/uL (ref 0.0–0.1)
Basophils Absolute: 0.1 10*3/uL (ref 0.0–0.1)
Basophils Relative: 1 %
Eosinophils Absolute: 0.2 10*3/uL (ref 0.0–0.7)
Eosinophils Relative: 2 %
HCT: 43.3 % (ref 39.0–52.0)
Hemoglobin: 14.9 g/dL (ref 13.0–17.0)
Immature Granulocytes: 1 %
Lymphocytes Relative: 48 %
Lymphs Abs: 4.3 10*3/uL — ABNORMAL HIGH (ref 0.7–4.0)
MCH: 32.9 pg (ref 26.0–34.0)
MCHC: 34.4 g/dL (ref 30.0–36.0)
MCV: 95.6 fL (ref 78.0–100.0)
Monocytes Absolute: 0.7 10*3/uL (ref 0.1–1.0)
Monocytes Relative: 7 %
Neutro Abs: 3.6 10*3/uL (ref 1.7–7.7)
Neutrophils Relative %: 41 %
Platelets: 152 10*3/uL (ref 150–400)
RBC: 4.53 MIL/uL (ref 4.22–5.81)
RDW: 13.2 % (ref 11.5–15.5)
WBC: 8.9 10*3/uL (ref 4.0–10.5)

## 2018-01-18 MED ORDER — ACETAMINOPHEN 500 MG PO TABS: 1000.0000 mg | ORAL_TABLET | Freq: Three times a day (TID) | ORAL | 0 refills | Status: AC

## 2018-01-18 MED ORDER — FENTANYL CITRATE (PF) 100 MCG/2ML IJ SOLN
50.0000 ug | Freq: Once | INTRAMUSCULAR | Status: AC
  Administered 2018-01-18: 50 ug via INTRAVENOUS
  Filled 2018-01-18: qty 2

## 2018-01-18 MED ORDER — TETANUS-DIPHTH-ACELL PERTUSSIS 5-2.5-18.5 LF-MCG/0.5 IM SUSP
0.5000 mL | Freq: Once | INTRAMUSCULAR | Status: AC
  Administered 2018-01-18: 0.5 mL via INTRAMUSCULAR
  Filled 2018-01-18: qty 0.5

## 2018-01-18 MED ORDER — OXYMETAZOLINE HCL 0.05 % NA SOLN: 1.0000 | Freq: Two times a day (BID) | NASAL | 0 refills | Status: DC | PRN

## 2018-01-18 MED ORDER — LIDOCAINE-EPINEPHRINE 2 %-1:200000 IJ SOLN
10.0000 mL | Freq: Once | INTRAMUSCULAR | Status: AC
Start: 2018-01-18 — End: 2018-01-18
  Administered 2018-01-18: 10 mL via INTRADERMAL
  Filled 2018-01-18: qty 20

## 2018-01-18 MED ORDER — HYDROCODONE-ACETAMINOPHEN 5-325 MG PO TABS: 1.0000 | ORAL_TABLET | Freq: Three times a day (TID) | ORAL | 0 refills | Status: AC | PRN
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
## 2018-01-18 NOTE — ED Notes (Addendum)
Irrigated PT's wound with sterile water. Applied moist 4X4 dressing

## 2018-02-06 ENCOUNTER — Encounter (HOSPITAL_COMMUNITY): Payer: Self-pay

## 2018-02-06 ENCOUNTER — Other Ambulatory Visit: Payer: Self-pay

## 2018-02-06 ENCOUNTER — Emergency Department (HOSPITAL_COMMUNITY)
Admission: EM | Admit: 2018-02-06 | Discharge: 2018-02-06 | Disposition: A | Discharge status: Home / Self Care | Payer: BLUE CROSS/BLUE SHIELD | Attending: Emergency Medicine | Admitting: Emergency Medicine

## 2018-02-06 DIAGNOSIS — S0101XD Laceration without foreign body of scalp, subsequent encounter: Secondary | ICD-10-CM | POA: Diagnosis not present

## 2018-02-06 DIAGNOSIS — X58XXXD Exposure to other specified factors, subsequent encounter: Secondary | ICD-10-CM | POA: Diagnosis not present

## 2018-02-06 DIAGNOSIS — Z4802 Encounter for removal of sutures: Secondary | ICD-10-CM | POA: Diagnosis not present

## 2018-02-06 DIAGNOSIS — F17210 Nicotine dependence, cigarettes, uncomplicated: Secondary | ICD-10-CM | POA: Insufficient documentation

## 2018-02-06 DIAGNOSIS — Z79899 Other long term (current) drug therapy: Secondary | ICD-10-CM | POA: Diagnosis not present

## 2018-02-06 NOTE — Discharge Instructions (Signed)
Return here as needed.  Keep the areas clean and dry.

## 2018-02-06 NOTE — ED Provider Notes (Signed)
[facility] EMERGENCY DEPARTMENT Provider Note   CSN: [id number] Arrival date & time: 02/06/18  1112     History   Chief Complaint Chief Complaint  Patient presents with  . Suture / Staple Removal    HPI Daniel Marshall is a 42 y.o. male.  HPI Patient presents to the emergency department for suture removal.  Patient states he had no complications from the sutures.  He states that replaced on June 25.  Patient states that he had no signs of infection or drainage from the wound. Past Medical History:  Diagnosis Date  . Asthma    as a child    Patient Active Problem List   Diagnosis Date Noted  . Coughing 08/16/2017  . Bronchospasm 08/16/2017    History reviewed. No pertinent surgical history.      Home Medications    Prior to Admission medications   Medication Sig Start Date End Date Taking? Authorizing Provider  albuterol (PROVENTIL HFA;VENTOLIN HFA) 108 (90 Base) MCG/ACT inhaler Inhale 2 puffs into the lungs every 4 (four) hours as needed for wheezing or shortness of breath (cough, shortness of breath or wheezing.). 12/01/17   Dorena Dew, FNP  fluticasone (FLONASE) 50 MCG/ACT nasal spray Place 1 spray into both nostrils daily. Patient not taking: Reported on 01/18/2018 12/07/16   Shary Decamp, PA-C  oxymetazoline (AFRIN NASAL SPRAY) 0.05 % nasal spray Place 1 spray into both nostrils 2 (two) times daily as needed (nose bleed). 01/18/18   Fatima Blank, MD  predniSONE (DELTASONE) 20 MG tablet Take 2 tablets (40 mg total) by mouth daily. Patient not taking: Reported on 11/25/2017 05/20/17   Davonna Belling, MD  Pseudoeph-Doxylamine-DM-APAP (NYQUIL PO) Take 1-2 capsules by mouth at bedtime as needed (cold/sleep).    [provider]  ranitidine (ZANTAC) 150 MG tablet Take 1 tablet (150 mg total) by mouth 2 (two) times daily. Patient not taking: Reported on 11/25/2017 05/05/17   Scot Jun, FNP  Vitamin D, Ergocalciferol, (DRISDOL)  50000 units CAPS capsule Take 1 capsule (50,000 Units total) by mouth every 7 (seven) days. Patient not taking: Reported on 11/25/2017 05/10/17   Scot Jun, FNP    Family History Family History  Family history unknown: Yes    Social History Social History   Tobacco Use  . Smoking status: Current Every Day Smoker    Types: Cigarettes  . Smokeless tobacco: Never Used  Substance Use Topics  . Alcohol use: Yes  . Drug use: Yes    Types: Marijuana     Allergies   Patient has no known allergies.   Review of Systems Review of Systems  All other systems negative except as documented in the HPI. All pertinent positives and negatives as reviewed in the HPI. Physical Exam Updated Vital Signs BP 133/88 (BP Location: Right Arm)   Pulse 93   Temp 98.7 F (37.1 C) (Oral)   Resp 18   Ht 5\' 6"  (1.676 m)   Wt 83.9 kg (185 lb)   SpO2 97%   BMI 29.86 kg/m   Physical Exam  Constitutional: He is oriented to person, place, and time. He appears well-developed and well-nourished. No distress.  HENT:  Head: Normocephalic.    Eyes: Pupils are equal, round, and reactive to light.  Pulmonary/Chest: Effort normal.  Neurological: He is alert and oriented to person, place, and time.  Skin: Skin is warm and dry.  Psychiatric: He has a normal mood and affect.  Nursing note and  vitals reviewed.    ED Treatments / Results  Labs (all labs ordered are listed, but only abnormal results are displayed) Labs Reviewed - No data to display  EKG None  Radiology No results found.  Procedures Procedures (including critical care time)  Medications Ordered in ED Medications - No data to display   Initial Impression / Assessment and Plan / ED Course  I have reviewed the triage vital signs and the nursing notes.  Pertinent labs & imaging results that were available during my care of the patient were reviewed by me and considered in my medical decision making (see chart for  details).     SUTURE REMOVAL Performed by: Resa Miner Kennethia Lynes  Consent: Verbal consent obtained. Patient identity confirmed: provided demographic data Time out: Immediately prior to procedure a "time out" was called to verify the correct patient, procedure, equipment, support staff and site/side marked as required.  Location details: Left lateral scalp  Wound Appearance: clean  Sutures/Staples Removed: Running sutures in place.  Facility: sutures placed in this facility Patient tolerance: Patient tolerated the procedure well with no immediate complications.     Final Clinical Impressions(s) / ED Diagnoses   Final diagnoses:  None    ED Discharge Orders    None       Dalia Heading, Hershal Coria 02/06/18 1135    Dorie Rank, MD 02/07/18 [phone]

## 2018-02-06 NOTE — ED Triage Notes (Signed)
Pt states that he was here a few weeks ago and had stitches placed, waited to come have them removed because he "did not feel like they were ready to come out".

## 2018-02-06 NOTE — ED Notes (Signed)
ED Provider at bedside. 

## 2018-03-07 ENCOUNTER — Emergency Department (HOSPITAL_COMMUNITY)
Admission: EM | Admit: 2018-03-07 | Discharge: 2018-03-07 | Disposition: A | Discharge status: Home / Self Care | Payer: BLUE CROSS/BLUE SHIELD | Attending: Emergency Medicine | Admitting: Emergency Medicine

## 2018-03-07 ENCOUNTER — Emergency Department (HOSPITAL_COMMUNITY): Payer: BLUE CROSS/BLUE SHIELD

## 2018-03-07 ENCOUNTER — Encounter (HOSPITAL_COMMUNITY): Payer: Self-pay

## 2018-03-07 ENCOUNTER — Other Ambulatory Visit: Payer: Self-pay

## 2018-03-07 DIAGNOSIS — R0781 Pleurodynia: Secondary | ICD-10-CM | POA: Diagnosis not present

## 2018-03-07 DIAGNOSIS — F17210 Nicotine dependence, cigarettes, uncomplicated: Secondary | ICD-10-CM | POA: Insufficient documentation

## 2018-03-07 DIAGNOSIS — R05 Cough: Secondary | ICD-10-CM | POA: Insufficient documentation

## 2018-03-07 DIAGNOSIS — Z8781 Personal history of (healed) traumatic fracture: Secondary | ICD-10-CM | POA: Diagnosis not present

## 2018-03-07 LAB — CBC WITH DIFFERENTIAL/PLATELET
Basophils Absolute: 0.1 10*3/uL (ref 0.0–0.1)
Basophils Relative: 1 %
Eosinophils Absolute: 0.1 10*3/uL (ref 0.0–0.7)
Eosinophils Relative: 2 %
HCT: 43.4 % (ref 39.0–52.0)
Hemoglobin: 15.6 g/dL (ref 13.0–17.0)
Lymphocytes Relative: 43 %
Lymphs Abs: 3.9 10*3/uL (ref 0.7–4.0)
MCH: 34.1 pg — ABNORMAL HIGH (ref 26.0–34.0)
MCHC: 35.9 g/dL (ref 30.0–36.0)
MCV: 95 fL (ref 78.0–100.0)
Monocytes Absolute: 0.5 10*3/uL (ref 0.1–1.0)
Monocytes Relative: 5 %
Neutro Abs: 4.5 10*3/uL (ref 1.7–7.7)
Neutrophils Relative %: 49 %
Platelets: 170 10*3/uL (ref 150–400)
RBC: 4.57 MIL/uL (ref 4.22–5.81)
RDW: 13.6 % (ref 11.5–15.5)
WBC: 9.1 10*3/uL (ref 4.0–10.5)

## 2018-03-07 LAB — I-STAT CHEM 8, ED
BUN: 10 mg/dL (ref 6–20)
Calcium, Ion: 1.09 mmol/L — ABNORMAL LOW (ref 1.15–1.40)
Chloride: 102 mmol/L (ref 98–111)
Creatinine, Ser: 1 mg/dL (ref 0.61–1.24)
Glucose, Bld: 98 mg/dL (ref 70–99)
HCT: 46 % (ref 39.0–52.0)
Hemoglobin: 15.6 g/dL (ref 13.0–17.0)
Potassium: 4.3 mmol/L (ref 3.5–5.1)
Sodium: 137 mmol/L (ref 135–145)
TCO2: 25 mmol/L (ref 22–32)

## 2018-03-07 LAB — D-DIMER, QUANTITATIVE: D-Dimer, Quant: 0.95 ug/mL-FEU — ABNORMAL HIGH (ref 0.00–0.50)

## 2018-03-07 MED ORDER — IPRATROPIUM-ALBUTEROL 0.5-2.5 (3) MG/3ML IN SOLN
3.0000 mL | Freq: Once | RESPIRATORY_TRACT | Status: AC
  Administered 2018-03-07: 3 mL via RESPIRATORY_TRACT
  Filled 2018-03-07: qty 3

## 2018-03-07 MED ORDER — LIDOCAINE 5 % EX PTCH: 1.0000 | MEDICATED_PATCH | CUTANEOUS | 0 refills | Status: DC

## 2018-03-07 MED ORDER — IOPAMIDOL (ISOVUE-370) INJECTION 76%
INTRAVENOUS | Status: AC
  Filled 2018-03-07: qty 100

## 2018-03-07 MED ORDER — IOPAMIDOL (ISOVUE-370) INJECTION 76%
100.0000 mL | Freq: Once | INTRAVENOUS | Status: AC | PRN
  Administered 2018-03-07: 87 mL via INTRAVENOUS

## 2018-03-07 MED ORDER — METHOCARBAMOL 500 MG PO TABS: 500.0000 mg | ORAL_TABLET | Freq: Two times a day (BID) | ORAL | 0 refills | Status: DC

## 2018-03-07 NOTE — ED Notes (Signed)
Patient transported to X-ray 

## 2018-03-07 NOTE — Discharge Instructions (Signed)
You can take Tylenol or Ibuprofen as directed for pain. You can alternate Tylenol and Ibuprofen every 4 hours. If you take Tylenol at 1pm, then you can take Ibuprofen at 5pm. Then you can take Tylenol again at 9pm.   Take Robaxin as prescribed. This medication will make you drowsy so do not drive or drink alcohol when taking it.  Use Lidocaine Patches as directed.   Follow-up with your primary care doctor next 2 to 4 days for further evaluation.  Return to emergency department for any worsening pain, fever, difficulty breathing, nausea/vomiting or any other worsening or concerning symptoms.
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
## 2018-03-07 NOTE — ED Triage Notes (Signed)
Pt c/o left sided Rib pain, and worsens with cough. Pt reports that pain started on rt side. 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

## 2018-03-08 ENCOUNTER — Ambulatory Visit (INDEPENDENT_AMBULATORY_CARE_PROVIDER_SITE_OTHER): Payer: BLUE CROSS/BLUE SHIELD | Admitting: Family Medicine

## 2018-03-08 ENCOUNTER — Encounter: Payer: Self-pay | Admitting: Family Medicine

## 2018-03-08 VITALS — BP 144/90 | HR 80 | Temp 98.7°F | Ht 66.0 in | Wt 179.0 lb

## 2018-03-08 DIAGNOSIS — Z09 Encounter for follow-up examination after completed treatment for conditions other than malignant neoplasm: Secondary | ICD-10-CM | POA: Diagnosis not present

## 2018-03-08 DIAGNOSIS — Z0289 Encounter for other administrative examinations: Secondary | ICD-10-CM | POA: Diagnosis not present

## 2018-03-08 DIAGNOSIS — I10 Essential (primary) hypertension: Secondary | ICD-10-CM | POA: Diagnosis not present

## 2018-03-08 DIAGNOSIS — S2249XA Multiple fractures of ribs, unspecified side, initial encounter for closed fracture: Secondary | ICD-10-CM | POA: Diagnosis not present
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
## 2018-03-17 ENCOUNTER — Telehealth: Payer: Self-pay

## 2018-03-18 NOTE — Telephone Encounter (Signed)
Patient spoke with the provider regarding letter.

## 2018-03-20 ENCOUNTER — Encounter: Payer: Self-pay | Admitting: Family Medicine

## 2018-03-21 NOTE — Telephone Encounter (Signed)
Patient pick up paperwork today and it was also faxed to Disability.

## 2018-04-12 ENCOUNTER — Encounter: Payer: Self-pay | Admitting: Family Medicine

## 2018-04-12 ENCOUNTER — Ambulatory Visit (INDEPENDENT_AMBULATORY_CARE_PROVIDER_SITE_OTHER): Payer: BLUE CROSS/BLUE SHIELD | Admitting: Family Medicine

## 2018-04-12 VITALS — BP 155/80 | HR 90 | Temp 98.6°F | Ht 66.0 in | Wt 176.0 lb

## 2018-04-12 DIAGNOSIS — Z09 Encounter for follow-up examination after completed treatment for conditions other than malignant neoplasm: Secondary | ICD-10-CM | POA: Diagnosis not present

## 2018-04-12 DIAGNOSIS — S2243XG Multiple fractures of ribs, bilateral, subsequent encounter for fracture with delayed healing: Secondary | ICD-10-CM | POA: Diagnosis not present

## 2018-04-12 DIAGNOSIS — I10 Essential (primary) hypertension: Secondary | ICD-10-CM | POA: Diagnosis not present

## 2018-04-12 NOTE — Progress Notes (Signed)
Follow Up  Subjective:    Patient ID: Daniel Marshall, male    DOB: Jul 06, 1976, 42 y.o.   MRN: [id number]   Chief Complaint  Patient presents with  . Follow-up    paperwork, ribs fracture    HPI  Daniel Marshall is a 42 year old male with a past medical history of Broken Ribs, and Asthma. He is here today for follow up.   Current Status: Since his last office visit, he reports moderate chest discomfort with breathing. He recently suffered recurrence of broken ribs and is presently on short term disability from work.    He denies fevers, chills, fatigue, recent infections, weight loss, and night sweats. He has not had any headaches, visual changes, dizziness, and falls. No heart palpitations, and cough reported. No reports of GI problems such as nausea, vomiting, diarrhea, and constipation. He has no reports of blood in stools, dysuria and hematuria. No depression or anxiety reported.  Past Medical History:  Diagnosis Date  . Asthma    as a child  . Broken ribs     Family History  Family history unknown: Yes    Social History   Socioeconomic History  . Marital status: Single    Spouse name: Not on file  . Number of children: Not on file  . Years of education: Not on file  . Highest education level: Not on file  Occupational History  . Not on file  Social Needs  . Financial resource strain: Not on file  . Food insecurity:    Worry: Not on file    Inability: Not on file  . Transportation needs:    Medical: Not on file    Non-medical: Not on file  Tobacco Use  . Smoking status: Current Every Day Smoker    Types: Cigarettes  . Smokeless tobacco: Never Used  Substance and Sexual Activity  . Alcohol use: Yes  . Drug use: Yes    Types: Marijuana  . Sexual activity: Not on file  Lifestyle  . Physical activity:    Days per week: Not on file    Minutes per session: Not on file  . Stress: Not on file  Relationships  . Social connections:    Talks on phone: Not on file    Gets  together: Not on file    Attends religious service: Not on file    Active member of club or organization: Not on file    Attends meetings of clubs or organizations: Not on file    Relationship status: Not on file  . Intimate partner violence:    Fear of current or ex partner: Not on file    Emotionally abused: Not on file    Physically abused: Not on file    Forced sexual activity: Not on file  Other Topics Concern  . Not on file  Social History Narrative  . Not on file    History reviewed. No pertinent surgical history.  Immunization History  Administered Date(s) Administered  . Tdap 01/18/2018    Current Meds  Medication Sig  . albuterol (PROVENTIL HFA;VENTOLIN HFA) 108 (90 Base) MCG/ACT inhaler Inhale 2 puffs into the lungs every 4 (four) hours as needed for wheezing or shortness of breath (cough, shortness of breath or wheezing.).  Marland Kitchen lidocaine (LIDODERM) 5 % Place 1 patch onto the skin daily. Remove & Discard patch within 12 hours or as directed by MD  . methocarbamol (ROBAXIN) 500 MG tablet Take 1 tablet (500 mg total) by  mouth 2 (two) times daily.    No Known Allergies  BP (!) 155/80   Pulse 90   Temp 98.6 F (37 C) (Oral)   Ht 5\' 6"  (1.676 m)   Wt 176 lb (79.8 kg)   SpO2 99%   BMI 28.41 kg/m    Review of Systems  Respiratory: Positive for shortness of breath (occasional ).   Cardiovascular:       Chest discomfort   Objective:   Physical Exam  Constitutional: He appears well-developed and well-nourished.  Cardiovascular: Normal rate, regular rhythm, normal heart sounds and intact distal pulses.  Pulmonary/Chest: Effort normal and breath sounds normal. Wheezes: right lung lobes.  Psychiatric: He has a normal mood and affect. His behavior is normal. Judgment and thought content normal.  Nursing note reviewed.  Assessment & Plan:   1. Multiple closed fractures of ribs of both sides with delayed healing, subsequent encounter Improving. His ribs continues  to heal, with moderate pain upon breathing. He continues to avoid heavy lifting as advised. We will continue to monitor.   2. Hypertension, unspecified type Blood pressure is at 142/86 today. We will continue to monitor. He will continue to decrease high sodium intake, excessive alcohol intake, increase potassium intake, smoking cessation, and increase physical activity of at least 30 minutes of cardio activity daily. He will continue to follow Heart Healthy or DASH diet.  3. Follow up He will follow up in 3 weeks.   No orders of the defined types were placed in this encounter.  Kathe Becton,  MSN, FNP-C [facility] [address] [place], [place] [address] [phone]

## 2018-05-03 ENCOUNTER — Ambulatory Visit (INDEPENDENT_AMBULATORY_CARE_PROVIDER_SITE_OTHER): Payer: BLUE CROSS/BLUE SHIELD | Admitting: Family Medicine

## 2018-05-03 ENCOUNTER — Encounter: Payer: Self-pay | Admitting: Family Medicine

## 2018-05-03 VITALS — BP 126/84 | HR 90 | Temp 97.9°F | Ht 66.0 in | Wt 179.2 lb

## 2018-05-03 DIAGNOSIS — Z09 Encounter for follow-up examination after completed treatment for conditions other than malignant neoplasm: Secondary | ICD-10-CM | POA: Diagnosis not present

## 2018-05-03 DIAGNOSIS — I10 Essential (primary) hypertension: Secondary | ICD-10-CM

## 2018-05-03 DIAGNOSIS — S2243XG Multiple fractures of ribs, bilateral, subsequent encounter for fracture with delayed healing: Secondary | ICD-10-CM | POA: Diagnosis not present

## 2018-05-03 NOTE — Progress Notes (Signed)
Follow Up  Subjective:    Patient ID: Daniel Marshall, male    DOB: Jul 30, 1975, 42 y.o.   MRN: [id number]   Chief Complaint  Patient presents with  . Follow-up    rib fracures     HPI Daniel Marshall is a 42 year old male with a past medical history of Broken Ribs and Asthma. He is here today for follow up.   Current Status: Since his last office visit, he states that he continues to experience moderate pain/discomfort in his left rib/flank area.   He denies fevers, chills, fatigue, recent infections, weight loss, and night sweats. He has not had any headaches, visual changes, dizziness, and falls. No chest pain, heart palpitations, cough and shortness of breath reported. No reports of GI problems such as nausea, vomiting, diarrhea, and constipation. He has no reports of blood in stools, dysuria and hematuria. No depression or anxiety reported.   Past Medical History:  Diagnosis Date  . Asthma    as a child  . Broken ribs     Family History  Family history unknown: Yes    Social History   Socioeconomic History  . Marital status: Single    Spouse name: Not on file  . Number of children: Not on file  . Years of education: Not on file  . Highest education level: Not on file  Occupational History  . Not on file  Social Needs  . Financial resource strain: Not on file  . Food insecurity:    Worry: Not on file    Inability: Not on file  . Transportation needs:    Medical: Not on file    Non-medical: Not on file  Tobacco Use  . Smoking status: Current Every Day Smoker    Types: Cigarettes  . Smokeless tobacco: Never Used  Substance and Sexual Activity  . Alcohol use: Yes  . Drug use: Yes    Types: Marijuana  . Sexual activity: Not on file  Lifestyle  . Physical activity:    Days per week: Not on file    Minutes per session: Not on file  . Stress: Not on file  Relationships  . Social connections:    Talks on phone: Not on file    Gets together: Not on file    Attends  religious service: Not on file    Active member of club or organization: Not on file    Attends meetings of clubs or organizations: Not on file    Relationship status: Not on file  . Intimate partner violence:    Fear of current or ex partner: Not on file    Emotionally abused: Not on file    Physically abused: Not on file    Forced sexual activity: Not on file  Other Topics Concern  . Not on file  Social History Narrative  . Not on file    History reviewed. No pertinent surgical history.  Immunization History  Administered Date(s) Administered  . Tdap 01/18/2018    Current Meds  Medication Sig  . albuterol (PROVENTIL HFA;VENTOLIN HFA) 108 (90 Base) MCG/ACT inhaler Inhale 2 puffs into the lungs every 4 (four) hours as needed for wheezing or shortness of breath (cough, shortness of breath or wheezing.).  Marland Kitchen lidocaine (LIDODERM) 5 % Place 1 patch onto the skin daily. Remove & Discard patch within 12 hours or as directed by MD  . methocarbamol (ROBAXIN) 500 MG tablet Take 1 tablet (500 mg total) by mouth 2 (two) times daily.  No Known Allergies  BP 126/84 (BP Location: Left Arm, Patient Position: Sitting, Cuff Size: Large)   Pulse 90   Temp 97.9 F (36.6 C) (Oral)   Ht 5\' 6"  (1.676 m)   Wt 179 lb 3.2 oz (81.3 kg)   SpO2 96%   BMI 28.92 kg/m   Review of Systems  Constitutional: Negative.   HENT: Negative.   Respiratory: Negative.   Cardiovascular: Negative.   Genitourinary: Positive for flank pain (left flank pain; r/t hx of broken ribs).  Musculoskeletal: Positive for arthralgias.       Left rib pain  Skin: Negative.   Neurological: Negative.   Psychiatric/Behavioral: Negative.    Objective:   Physical Exam  Constitutional: He is oriented to person, place, and time. He appears well-developed and well-nourished.  Cardiovascular: Normal rate, regular rhythm, normal heart sounds and intact distal pulses.  Pulmonary/Chest: Effort normal and breath sounds normal.   Abdominal: Soft. Bowel sounds are normal.  Musculoskeletal: He exhibits tenderness (left rib area).  Neurological: He is alert and oriented to person, place, and time.  Skin: Skin is warm and dry.  Psychiatric: He has a normal mood and affect. His behavior is normal. Judgment and thought content normal.  Nursing note and vitals reviewed.  Assessment & Plan:   1. Multiple closed fractures of ribs of both sides with delayed healing, subsequent encounter We will repeat Chest X-ray within a week.  - DG Chest 2 View; Future  2. Hypertension, unspecified type Blood pressure is stable at 126/84 today. She will continue to decrease high sodium intake, excessive alcohol intake, increase potassium intake, smoking cessation, and increase physical activity of at least 30 minutes of cardio activity daily. She will continue to follow Heart Healthy or DASH diet.  3. Follow up He will follow up in 1 month.  No orders of the defined types were placed in this encounter.   Kathe Becton,  MSN, FNP-C Patient [facility] [address] [place], [place] [address] [phone]

## 2018-05-05 ENCOUNTER — Ambulatory Visit (HOSPITAL_COMMUNITY)
Admission: RE | Admit: 2018-05-05 | Discharge: 2018-05-05 | Disposition: A | Discharge status: Home / Self Care | Payer: BLUE CROSS/BLUE SHIELD | Source: Ambulatory Visit | Attending: Family Medicine | Admitting: Family Medicine

## 2018-05-05 DIAGNOSIS — S2243XG Multiple fractures of ribs, bilateral, subsequent encounter for fracture with delayed healing: Secondary | ICD-10-CM | POA: Diagnosis not present

## 2018-05-05 DIAGNOSIS — X58XXXD Exposure to other specified factors, subsequent encounter: Secondary | ICD-10-CM | POA: Insufficient documentation

## 2018-05-06 ENCOUNTER — Telehealth: Payer: Self-pay

## 2018-05-06 NOTE — Telephone Encounter (Signed)
Patient notified and will come by and pick up letter.

## 2018-05-06 NOTE — Telephone Encounter (Signed)
Patient states that he took his xray yesterday and they told him that his ribs are still not healed. Patient states that he is suppose to go back to work on Monday and wants to be out because he is still not healed. Please advise

## 2018-06-03 ENCOUNTER — Ambulatory Visit (INDEPENDENT_AMBULATORY_CARE_PROVIDER_SITE_OTHER): Payer: Self-pay | Admitting: Family Medicine

## 2018-06-03 ENCOUNTER — Encounter: Payer: Self-pay | Admitting: Family Medicine

## 2018-06-03 VITALS — BP 132/86 | HR 92 | Temp 98.2°F | Ht 66.0 in | Wt 184.0 lb

## 2018-06-03 DIAGNOSIS — S2243XG Multiple fractures of ribs, bilateral, subsequent encounter for fracture with delayed healing: Secondary | ICD-10-CM

## 2018-06-03 DIAGNOSIS — R0781 Pleurodynia: Secondary | ICD-10-CM

## 2018-06-03 DIAGNOSIS — R0789 Other chest pain: Secondary | ICD-10-CM

## 2018-06-03 DIAGNOSIS — Z09 Encounter for follow-up examination after completed treatment for conditions other than malignant neoplasm: Secondary | ICD-10-CM

## 2018-06-03 DIAGNOSIS — R059 Cough, unspecified: Secondary | ICD-10-CM

## 2018-06-03 DIAGNOSIS — R0602 Shortness of breath: Secondary | ICD-10-CM

## 2018-06-03 DIAGNOSIS — I10 Essential (primary) hypertension: Secondary | ICD-10-CM

## 2018-06-03 DIAGNOSIS — R05 Cough: Secondary | ICD-10-CM

## 2018-06-03 NOTE — Progress Notes (Signed)
Follow Up  Subjective:    Patient ID: Daniel Marshall, male    DOB: 13-Jul-1976, 42 y.o.   MRN: [id number]   Chief Complaint  Patient presents with  . Follow-up    rib fracture   HPI  Daniel Marshall is a 42 year old male with a past medical history of Broken Ribs and Asthma. He is here today for follow up.   Current Status: Since his last office visit, he continues to have mild rib pain when coughing. No chest pain, heart palpitations, cough and shortness of breath reported. He reports that his rib pain/discomfort is improving. He denies visual changes, chest pain, cough, shortness of breath, heart palpitations, and falls. He has occasionally headaches and dizziness with position changes. Denies severe headaches, confusion, seizures, double vision, and blurred vision, nausea and vomiting.  He denies fevers, chills, fatigue, recent infections, weight loss, and night sweats. He has not had any and falls. No reports of GI problems such as diarrhea, and constipation. He has no reports of blood in stools, dysuria and hematuria. No depression or anxiety reported.   Past Medical History:  Diagnosis Date  . Asthma    as a child  . Broken ribs   . Hypertension    Family History  Family history unknown: Yes     Social History   Socioeconomic History  . Marital status: Single    Spouse name: Not on file  . Number of children: Not on file  . Years of education: Not on file  . Highest education level: Not on file  Occupational History  . Not on file  Social Needs  . Financial resource strain: Not on file  . Food insecurity:    Worry: Not on file    Inability: Not on file  . Transportation needs:    Medical: Not on file    Non-medical: Not on file  Tobacco Use  . Smoking status: Current Every Day Smoker    Types: Cigarettes  . Smokeless tobacco: Never Used  Substance and Sexual Activity  . Alcohol use: Yes  . Drug use: Yes    Types: Marijuana  . Sexual activity: Not on file  Lifestyle   . Physical activity:    Days per week: Not on file    Minutes per session: Not on file  . Stress: Not on file  Relationships  . Social connections:    Talks on phone: Not on file    Gets together: Not on file    Attends religious service: Not on file    Active member of club or organization: Not on file    Attends meetings of clubs or organizations: Not on file    Relationship status: Not on file  . Intimate partner violence:    Fear of current or ex partner: Not on file    Emotionally abused: Not on file    Physically abused: Not on file    Forced sexual activity: Not on file  Other Topics Concern  . Not on file  Social History Narrative  . Not on file    No past surgical history on file.  Immunization History  Administered Date(s) Administered  . Tdap 01/18/2018    Current Meds  Medication Sig  . albuterol (PROVENTIL HFA;VENTOLIN HFA) 108 (90 Base) MCG/ACT inhaler Inhale 2 puffs into the lungs every 4 (four) hours as needed for wheezing or shortness of breath (cough, shortness of breath or wheezing.).  Marland Kitchen lidocaine (LIDODERM) 5 % Place 1 patch  onto the skin daily. Remove & Discard patch within 12 hours or as directed by MD  . methocarbamol (ROBAXIN) 500 MG tablet Take 1 tablet (500 mg total) by mouth 2 (two) times daily.    No Known Allergies  BP 132/86 (BP Location: Left Arm, Patient Position: Sitting, Cuff Size: Small)   Pulse 92   Temp 98.2 F (36.8 C) (Oral)   Ht 5\' 6"  (1.676 m)   Wt 184 lb (83.5 kg)   SpO2 98%   BMI 29.70 kg/m   Review of Systems  Constitutional: Negative.   HENT: Negative.   Respiratory: Negative.   Cardiovascular: Positive for chest pain (r/t rib fractures).  Gastrointestinal: Negative.   Endocrine: Negative.   Genitourinary: Negative.   Musculoskeletal: Negative.        Rib pain. R>L  Skin: Negative.   Allergic/Immunologic: Negative.   Neurological: Negative.   Hematological: Negative.   Psychiatric/Behavioral: Negative.     Objective:   Physical Exam  Constitutional: He is oriented to person, place, and time. He appears well-developed and well-nourished.  HENT:  Head: Normocephalic and atraumatic.  Eyes: Pupils are equal, round, and reactive to light. EOM are normal.  Neck: Normal range of motion. Neck supple.  Cardiovascular: Normal rate, regular rhythm, normal heart sounds and intact distal pulses.  Pulmonary/Chest: Effort normal and breath sounds normal.  Abdominal: Soft. Bowel sounds are normal.  Musculoskeletal: Normal range of motion.  Neurological: He is alert and oriented to person, place, and time.  Skin: Skin is warm and dry.  Psychiatric: He has a normal mood and affect. His behavior is normal. Judgment and thought content normal.  Nursing note and vitals reviewed.  Assessment & Plan:   1. Multiple closed fractures of ribs of both sides with delayed healing, subsequent encounter Improving. Mild pain when cough. Note written for patient today, to return to work on 06/13/2018.   2. Hypertension, unspecified type Blood pressure is 132/86 today. We will continue to monitor. She will continue to decrease high sodium intake, excessive alcohol intake, increase potassium intake, smoking cessation, and increase physical activity of at least 30 minutes of cardio activity daily. She will continue to follow Heart Healthy or DASH diet.  3. Coughing Stable. Not worsening.   4. Shortness of breath  5. Follow up He will follow up in 3 months.   No orders of the defined types were placed in this encounter.   Kathe Becton,  MSN, FNP-C Patient [facility] [address] [place], [place] [address] [phone]

## 2018-06-04 ENCOUNTER — Encounter: Payer: Self-pay | Admitting: Family Medicine

## 2018-06-06 ENCOUNTER — Telehealth: Payer: Self-pay

## 2018-06-06 NOTE — Telephone Encounter (Signed)
Patient states that his job doesn't have any light duty work and that they don't want him to come back until he is 100%. Patient would like to discuss with you.

## 2018-06-10 ENCOUNTER — Telehealth: Payer: Self-pay

## 2018-06-10 NOTE — Telephone Encounter (Signed)
Patient called again about letter

## 2018-06-13 ENCOUNTER — Telehealth: Payer: Self-pay

## 2018-06-13 ENCOUNTER — Other Ambulatory Visit: Payer: Self-pay | Admitting: Family Medicine

## 2018-06-13 NOTE — Telephone Encounter (Signed)
Patient notified that letter is ready 

## 2018-06-13 NOTE — Telephone Encounter (Signed)
Patient needs a letter stating he can go back to full duty

## 2018-09-05 ENCOUNTER — Ambulatory Visit: Payer: Self-pay | Admitting: Family Medicine

## 2018-10-07 ENCOUNTER — Ambulatory Visit: Payer: Self-pay | Admitting: Family Medicine

## 2018-10-14 ENCOUNTER — Encounter: Payer: Self-pay | Admitting: Family Medicine

## 2018-10-14 ENCOUNTER — Ambulatory Visit (INDEPENDENT_AMBULATORY_CARE_PROVIDER_SITE_OTHER): Payer: Self-pay | Admitting: Family Medicine

## 2018-10-14 ENCOUNTER — Other Ambulatory Visit: Payer: Self-pay

## 2018-10-14 VITALS — BP 130/68 | HR 100 | Temp 98.0°F | Ht 66.0 in | Wt 183.3 lb

## 2018-10-14 DIAGNOSIS — J45909 Unspecified asthma, uncomplicated: Secondary | ICD-10-CM

## 2018-10-14 DIAGNOSIS — M62838 Other muscle spasm: Secondary | ICD-10-CM

## 2018-10-14 DIAGNOSIS — R062 Wheezing: Secondary | ICD-10-CM

## 2018-10-14 DIAGNOSIS — R252 Cramp and spasm: Secondary | ICD-10-CM

## 2018-10-14 DIAGNOSIS — R0602 Shortness of breath: Secondary | ICD-10-CM

## 2018-10-14 DIAGNOSIS — Z09 Encounter for follow-up examination after completed treatment for conditions other than malignant neoplasm: Secondary | ICD-10-CM

## 2018-10-14 MED ORDER — ALBUTEROL SULFATE HFA 108 (90 BASE) MCG/ACT IN AERS: 2.0000 | INHALATION_SPRAY | RESPIRATORY_TRACT | 6 refills | Status: DC | PRN

## 2018-10-14 MED ORDER — CYCLOBENZAPRINE HCL 10 MG PO TABS: 10.0000 mg | ORAL_TABLET | Freq: Two times a day (BID) | ORAL | 2 refills | Status: DC | PRN

## 2018-10-14 NOTE — Progress Notes (Signed)
Patient [facility]  Established Patient Office Visit  Subjective:  Patient ID: Daniel Marshall, male    DOB: [birth year]  Age: 43 y.o. MRN: [id number]  CC:  Chief Complaint  Patient presents with  . Follow-up    having cramps all over    HPI Daniel Marshall is a 43 year old male who presents for follow up today.   Past Medical History:  Diagnosis Date  . Asthma    as a child  . Broken ribs   . Hypertension    Current Status: Since his last office visit, he is doing well with no complaints. He denies fevers, chills, fatigue, recent infections, weight loss, and night sweats. He has not had any headaches, visual changes, dizziness, and falls. No chest pain, heart palpitations, cough and shortness of breath reported. No reports of GI problems such as nausea, vomiting, diarrhea, and constipation. He has no reports of blood in stools, dysuria and hematuria. No depression or anxiety, and denies suicidal ideations, homicidal ideations, or auditory hallucinations. He denies pain today.   History reviewed. No pertinent surgical history.  Family History  Family history unknown: Yes    Social History   Socioeconomic History  . Marital status: Single    Spouse name: Not on file  . Number of children: Not on file  . Years of education: Not on file  . Highest education level: Not on file  Occupational History  . Not on file  Social Needs  . Financial resource strain: Not on file  . Food insecurity:    Worry: Not on file    Inability: Not on file  . Transportation needs:    Medical: Not on file    Non-medical: Not on file  Tobacco Use  . Smoking status: Current Every Day Smoker    Types: Cigarettes  . Smokeless tobacco: Never Used  Substance and Sexual Activity  . Alcohol use: Yes  . Drug use: Yes    Types: Marijuana  . Sexual activity: Not on file  Lifestyle  . Physical activity:    Days per week: Not on file    Minutes per session:  Not on file  . Stress: Not on file  Relationships  . Social connections:    Talks on phone: Not on file    Gets together: Not on file    Attends religious service: Not on file    Active member of club or organization: Not on file    Attends meetings of clubs or organizations: Not on file    Relationship status: Not on file  . Intimate partner violence:    Fear of current or ex partner: Not on file    Emotionally abused: Not on file    Physically abused: Not on file    Forced sexual activity: Not on file  Other Topics Concern  . Not on file  Social History Narrative  . Not on file    Outpatient Medications Prior to Visit  Medication Sig Dispense Refill  . lidocaine (LIDODERM) 5 % Place 1 patch onto the skin daily. Remove & Discard patch within 12 hours or as directed by MD (Patient not taking: Reported on 10/14/2018) 10 patch 0  . methocarbamol (ROBAXIN) 500 MG tablet Take 1 tablet (500 mg total) by mouth 2 (two) times daily. (Patient not taking: Reported on 10/14/2018) 20 tablet 0  . albuterol (PROVENTIL HFA;VENTOLIN HFA) 108 (90 Base) MCG/ACT inhaler Inhale 2 puffs into  the lungs every 4 (four) hours as needed for wheezing or shortness of breath (cough, shortness of breath or wheezing.). (Patient not taking: Reported on 10/14/2018) 1 Inhaler 1   No facility-administered medications prior to visit.     No Known Allergies  ROS Review of Systems  Constitutional: Negative.   HENT: Negative.   Eyes: Negative.   Respiratory: Positive for shortness of breath (Occasional ).   Cardiovascular: Negative.   Gastrointestinal: Negative.   Endocrine: Negative.   Genitourinary: Negative.   Musculoskeletal: Negative.   Skin: Negative.   Allergic/Immunologic: Negative.   Neurological: Positive for dizziness and headaches.  Hematological: Negative.   Psychiatric/Behavioral: Negative.    Objective:    Physical Exam  Constitutional: He is oriented to person, place, and time. He appears  well-developed and well-nourished.  HENT:  Head: Normocephalic and atraumatic.  Eyes: Conjunctivae are normal.  Neck: Normal range of motion. Neck supple.  Cardiovascular: Normal rate, regular rhythm, normal heart sounds and intact distal pulses.  Pulmonary/Chest: Effort normal. He has wheezes (right mid and lower lung lobes).  Abdominal: Soft. Bowel sounds are normal.  Musculoskeletal: Normal range of motion.  Neurological: He is alert and oriented to person, place, and time. He has normal reflexes.  Skin: Skin is warm and dry.  Psychiatric: He has a normal mood and affect. His behavior is normal. Judgment and thought content normal.  Nursing note and vitals reviewed.   BP 130/68 (BP Location: Left Arm, Patient Position: Sitting, Cuff Size: Large)   Pulse 100   Temp 98 F (36.7 C) (Oral)   Ht 5\' 6"  (1.676 m)   Wt 183 lb 4.8 oz (83.1 kg)   SpO2 100%   BMI 29.59 kg/m  Wt Readings from Last 3 Encounters:  10/14/18 183 lb 4.8 oz (83.1 kg)  06/03/18 184 lb (83.5 kg)  05/03/18 179 lb 3.2 oz (81.3 kg)    Health Maintenance Due  Topic Date Due  . INFLUENZA VACCINE  02/24/2018    There are no preventive care reminders to display for this patient.  Lab Results  Component Value Date   TSH 1.87 05/05/2017   Lab Results  Component Value Date   WBC 9.1 03/07/2018   HGB 15.6 03/07/2018   HCT 46.0 03/07/2018   MCV 95.0 03/07/2018   PLT 170 03/07/2018   Lab Results  Component Value Date   NA 137 03/07/2018   K 4.3 03/07/2018   CO2 17 (L) 01/18/2018   GLUCOSE 98 03/07/2018   BUN 10 03/07/2018   CREATININE 1.00 03/07/2018   BILITOT 1.2 05/19/2017   ALKPHOS 123 05/19/2017   AST 76 (H) 05/19/2017   ALT 48 05/19/2017   PROT 7.5 05/19/2017   ALBUMIN 3.2 (L) 05/19/2017   CALCIUM 8.3 (L) 01/18/2018   ANIONGAP 20 (H) 01/18/2018   No results found for: CHOL No results found for: HDL No results found for: LDLCALC No results found for: TRIG No results found for: CHOLHDL Lab  Results  Component Value Date   HGBA1C 5.0 05/05/2017   Assessment & Plan:   1. Muscle spasms of both lower extremities We will initiate Flexeril today.  - cyclobenzaprine (FLEXERIL) 10 MG tablet; Take 1 tablet (10 mg total) by mouth 2 (two) times daily as needed for muscle spasms.  Dispense: 30 tablet; Refill: 2  2. Muscle cramps  3. Mild asthma, unspecified whether complicated, unspecified whether persistent - albuterol (PROVENTIL HFA;VENTOLIN HFA) 108 (90 Base) MCG/ACT inhaler; Inhale 2 puffs into the lungs  every 4 (four) hours as needed for wheezing or shortness of breath (cough, shortness of breath or wheezing.).  Dispense: 1 Inhaler; Refill: 6  4. Wheezes He will use Albuterol inhaler as needed.   5. Shortness of breath Stable today. No signs or symptoms of respiratory distress noted or reported.  - albuterol (PROVENTIL HFA;VENTOLIN HFA) 108 (90 Base) MCG/ACT inhaler; Inhale 2 puffs into the lungs every 4 (four) hours as needed for wheezing or shortness of breath (cough, shortness of breath or wheezing.).  Dispense: 1 Inhaler; Refill: 6  6. Follow up He will follow up in 6 months.    Meds ordered this encounter  Medications  . albuterol (PROVENTIL HFA;VENTOLIN HFA) 108 (90 Base) MCG/ACT inhaler    Sig: Inhale 2 puffs into the lungs every 4 (four) hours as needed for wheezing or shortness of breath (cough, shortness of breath or wheezing.).    Dispense:  1 Inhaler    Refill:  6  . cyclobenzaprine (FLEXERIL) 10 MG tablet    Sig: Take 1 tablet (10 mg total) by mouth 2 (two) times daily as needed for muscle spasms.    Dispense:  30 tablet    Refill:  2    No orders of the defined types were placed in this encounter.   Referral Orders  No referral(s) requested today    Kathe Becton,  MSN, FNP-C Patient [place], [place] [address] [phone]   Problem List Items Addressed This Visit    None    Visit  Diagnoses    Muscle spasms of both lower extremities    -  Primary   Relevant Medications   cyclobenzaprine (FLEXERIL) 10 MG tablet   Muscle cramps       Mild asthma, unspecified whether complicated, unspecified whether persistent       Relevant Medications   albuterol (PROVENTIL HFA;VENTOLIN HFA) 108 (90 Base) MCG/ACT inhaler   Shortness of breath       Relevant Medications   albuterol (PROVENTIL HFA;VENTOLIN HFA) 108 (90 Base) MCG/ACT inhaler   Follow up          Meds ordered this encounter  Medications  . albuterol (PROVENTIL HFA;VENTOLIN HFA) 108 (90 Base) MCG/ACT inhaler    Sig: Inhale 2 puffs into the lungs every 4 (four) hours as needed for wheezing or shortness of breath (cough, shortness of breath or wheezing.).    Dispense:  1 Inhaler    Refill:  6  . cyclobenzaprine (FLEXERIL) 10 MG tablet    Sig: Take 1 tablet (10 mg total) by mouth 2 (two) times daily as needed for muscle spasms.    Dispense:  30 tablet    Refill:  2    Follow-up: Return in about 6 months (around 04/16/2019).    Azzie Glatter, FNP

## 2018-10-14 NOTE — Patient Instructions (Signed)
Cyclobenzaprine tablets What is this medicine? CYCLOBENZAPRINE (sye kloe BEN za preen) is a muscle relaxer. It is used to treat muscle pain, spasms, and stiffness. This medicine may be used for other purposes; ask your health care provider or pharmacist if you have questions. COMMON BRAND NAME(S): Fexmid, Flexeril What should I tell my health care provider before I take this medicine? They need to know if you have any of these conditions: -heart disease, irregular heartbeat, or previous heart attack -liver disease -thyroid problem -an unusual or allergic reaction to cyclobenzaprine, tricyclic antidepressants, lactose, other medicines, foods, dyes, or preservatives -pregnant or trying to get pregnant -breast-feeding How should I use this medicine? Take this medicine by mouth with a glass of water. Follow the directions on the prescription label. If this medicine upsets your stomach, take it with food or milk. Take your medicine at regular intervals. Do not take it more often than directed. Talk to your pediatrician regarding the use of this medicine in children. Special care may be needed. Overdosage: If you think you have taken too much of this medicine contact a poison control center or emergency room at once. NOTE: This medicine is only for you. Do not share this medicine with others. What if I miss a dose? If you miss a dose, take it as soon as you can. If it is almost time for your next dose, take only that dose. Do not take double or extra doses. What may interact with this medicine? Do not take this medicine with any of the following medications: -MAOIs like Carbex, Eldepryl, Marplan, Nardil, and Parnate This medicine may also interact with the following medications: -alcohol -antihistamines for allergy, cough, and cold -certain medicines for anxiety or sleep -certain medicines for depression like amitriptyline, fluoxetine, sertraline -certain medicines for seizures like  phenobarbital, primidone -contrast dyes -local anesthetics like lidocaine, pramoxine, tetracaine -medicines that relax muscles for surgery -narcotic medicines for pain -phenothiazines like chlorpromazine, mesoridazine, prochlorperazine This list may not describe all possible interactions. Give your health care provider a list of all the medicines, herbs, non-prescription drugs, or dietary supplements you use. Also tell them if you smoke, drink alcohol, or use illegal drugs. Some items may interact with your medicine. What should I watch for while using this medicine? Tell your doctor or health care professional if your symptoms do not start to get better or if they get worse. You may get drowsy or dizzy. Do not drive, use machinery, or do anything that needs mental alertness until you know how this medicine affects you. Do not stand or sit up quickly, especially if you are an older patient. This reduces the risk of dizzy or fainting spells. Alcohol may interfere with the effect of this medicine. Avoid alcoholic drinks. If you are taking another medicine that also causes drowsiness, you may have more side effects. Give your health care provider a list of all medicines you use. Your doctor will tell you how much medicine to take. Do not take more medicine than directed. Call emergency for help if you have problems breathing or unusual sleepiness. Your mouth may get dry. Chewing sugarless gum or sucking hard candy, and drinking plenty of water may help. Contact your doctor if the problem does not go away or is severe. What side effects may I notice from receiving this medicine? Side effects that you should report to your doctor or health care professional as soon as possible: -allergic reactions like skin rash, itching or hives, swelling of the face,  lips, or tongue -breathing problems -chest pain -fast, irregular heartbeat -hallucinations -seizures -unusually weak or tired Side effects that  usually do not require medical attention (report to your doctor or health care professional if they continue or are bothersome): -headache -nausea, vomiting This list may not describe all possible side effects. Call your doctor for medical advice about side effects. You may report side effects to FDA at 1-800-FDA-1088. Where should I keep my medicine? Keep out of the reach of children. Store at room temperature between 15 and 30 degrees C (59 and 86 degrees F). Keep container tightly closed. Throw away any unused medicine after the expiration date. NOTE: This sheet is a summary. It may not cover all possible information. If you have questions about this medicine, talk to your doctor, pharmacist, or health care provider.  2019 Elsevier/Gold Standard (2017-05-05 13:04:35) Muscle Cramps and Spasms Muscle cramps and spasms are when muscles tighten by themselves. They usually get better within minutes. Muscle cramps are painful. They are usually stronger and last longer than muscle spasms. Muscle spasms may or may not be painful. They can last a few seconds or much longer. Cramps and spasms can affect any muscle, but they occur most often in the calf muscles of the leg. They are usually not caused by a serious problem. In many cases, the cause is not known. Some common causes include:  Doing more physical work or exercise than your body is ready for.  Using the muscles too much (overuse) by repeating certain movements too many times.  Staying in a certain position for a long time.  Playing a sport or doing an activity without preparing properly.  Using bad form or technique while playing a sport or doing an activity.  Not having enough water in your body (dehydration).  Injury.  Side effects of some medicines.  Low levels of the salts and minerals in your blood (electrolytes), such as low potassium or calcium. Follow these instructions at home: Managing pain and stiffness       Massage, stretch, and relax the muscle. Do this for many minutes at a time.  If told, put heat on tight or tense muscles as often as told by your doctor. Use the heat source that your doctor recommends, such as a moist heat pack or a heating pad. ? Place a towel between your skin and the heat source. ? Leave the heat on for 20-30 minutes. ? Remove the heat if your skin turns bright red. This is very important if you are not able to feel pain, heat, or cold. You may have a greater risk of getting burned.  If told, put ice on the affected area. This may help if you are sore or have pain after a cramp or spasm. ? Put ice in a plastic bag. ? Place a towel between your skin and the bag. ? Leave the ice on for 20 minutes, 2-3 times a day.  Try taking hot showers or baths to help relax tight muscles. Eating and drinking  Drink enough fluid to keep your pee (urine) pale yellow.  Eat a healthy diet to help ensure that your muscles work well. This should include: ? Fruits and vegetables. ? Lean protein. ? Whole grains. ? Low-fat or nonfat dairy products. General instructions  If you are having cramps often, avoid intense exercise for several days.  Take over-the-counter and prescription medicines only as told by your doctor.  Watch for any changes in your symptoms.  Keep all  follow-up visits as told by your doctor. This is important. Contact a doctor if:  Your cramps or spasms get worse or happen more often.  Your cramps or spasms do not get better with time. Summary  Muscle cramps and spasms are when muscles tighten by themselves. They usually get better within minutes.  Cramps and spasms occur most often in the calf muscles of the leg.  Massage, stretch, and relax the muscle. This may help the cramp or spasm go away.  Drink enough fluid to keep your pee (urine) pale yellow. This information is not intended to replace advice given to you by your health care provider. Make sure you  discuss any questions you have with your health care provider. Document Released: 06/25/2008 Document Revised: 12/06/2017 Document Reviewed: 12/06/2017 Elsevier Interactive Patient Education  2019 [employer].

## 2018-10-16 DIAGNOSIS — J45909 Unspecified asthma, uncomplicated: Secondary | ICD-10-CM | POA: Insufficient documentation

## 2018-10-16 DIAGNOSIS — R0602 Shortness of breath: Secondary | ICD-10-CM | POA: Insufficient documentation

## 2018-10-16 DIAGNOSIS — R062 Wheezing: Secondary | ICD-10-CM | POA: Insufficient documentation

## 2018-12-21 ENCOUNTER — Telehealth: Payer: Self-pay

## 2018-12-21 NOTE — Telephone Encounter (Signed)
Left a vm for patient to callback 

## 2018-12-21 NOTE — Telephone Encounter (Signed)
Patient has a callus on left foot and would like a referral to have it moved

## 2018-12-21 NOTE — Telephone Encounter (Signed)
Tried to contact patient again no answer

## 2018-12-22 ENCOUNTER — Other Ambulatory Visit: Payer: Self-pay | Admitting: Family Medicine

## 2018-12-22 DIAGNOSIS — L84 Corns and callosities: Secondary | ICD-10-CM

## 2018-12-22 NOTE — Telephone Encounter (Signed)
Patient given information to [facility]

## 2018-12-28 ENCOUNTER — Ambulatory Visit: Payer: BLUE CROSS/BLUE SHIELD | Admitting: Podiatry

## 2018-12-28 ENCOUNTER — Other Ambulatory Visit: Payer: Self-pay

## 2018-12-28 ENCOUNTER — Encounter: Payer: Self-pay | Admitting: Podiatry

## 2018-12-28 VITALS — BP 140/54 | Temp 98.6°F

## 2018-12-28 DIAGNOSIS — B353 Tinea pedis: Secondary | ICD-10-CM | POA: Diagnosis not present

## 2018-12-28 DIAGNOSIS — Q828 Other specified congenital malformations of skin: Secondary | ICD-10-CM | POA: Diagnosis not present

## 2018-12-28 DIAGNOSIS — M79674 Pain in right toe(s): Secondary | ICD-10-CM

## 2018-12-28 DIAGNOSIS — B351 Tinea unguium: Secondary | ICD-10-CM | POA: Diagnosis not present

## 2018-12-28 DIAGNOSIS — M79675 Pain in left toe(s): Secondary | ICD-10-CM

## 2018-12-28 MED ORDER — CICLOPIROX 0.77 % EX GEL
CUTANEOUS | 0 refills | Status: DC
Start: 2018-12-28 — End: 2022-06-10

## 2018-12-28 NOTE — Patient Instructions (Signed)

## 2019-01-01 NOTE — Progress Notes (Signed)
Subjective: Daniel Marshall presents today referred by Daniel Becton, FNP for foot evaluation. Daniel Marshall relates painful callus on plantar aspect of his left foot. Callus has been present for about one year and pain has increased over the past several weeks. Pain is interfering with his ability to perform his job which requires standing.  Pain is getting progressively worse. He has attempted to trim it himself, but he gets no relief of symptoms.  Patient is seeking professional help regarding symptoms.  Past Medical History:  Diagnosis Date  . Asthma    as a child  . Broken ribs   . Hypertension      Patient Active Problem List   Diagnosis Date Noted  . Mild asthma 10/16/2018  . Wheezes 10/16/2018  . Shortness of breath 10/16/2018  . Coughing 08/16/2017  . Bronchospasm 08/16/2017     History reviewed. No pertinent surgical history.    Current Outpatient Medications:  .  albuterol (PROVENTIL HFA;VENTOLIN HFA) 108 (90 Base) MCG/ACT inhaler, Inhale 2 puffs into the lungs every 4 (four) hours as needed for wheezing or shortness of breath (cough, shortness of breath or wheezing.)., Disp: 1 Inhaler, Rfl: 6 .  cyclobenzaprine (FLEXERIL) 10 MG tablet, Take 1 tablet (10 mg total) by mouth 2 (two) times daily as needed for muscle spasms., Disp: 30 tablet, Rfl: 2 .  lidocaine (LIDODERM) 5 %, Place 1 patch onto the skin daily. Remove & Discard patch within 12 hours or as directed by MD, Disp: 10 patch, Rfl: 0 .  methocarbamol (ROBAXIN) 500 MG tablet, Take 1 tablet (500 mg total) by mouth 2 (two) times daily., Disp: 20 tablet, Rfl: 0 .  Ciclopirox 0.77 % gel, Apply to both feet and between toes bid for 4 weeks., Disp: 45 g, Rfl: 0   No Known Allergies   Social History   Occupational History  . Not on file  Tobacco Use  . Smoking status: Current Every Day Smoker    Types: Cigarettes  . Smokeless tobacco: Never Used  Substance and Sexual Activity  . Alcohol use: Yes  . Drug use: Yes   Types: Marijuana  . Sexual activity: Not on file     Family History  Family history unknown: Yes     Immunization History  Administered Date(s) Administered  . Tdap 01/18/2018     Review of systems: Positive Findings in bold print.  Constitutional:  chills, fatigue, fever, sweats, weight change Communication: Optometrist, sign Ecologist, hand writing, iPad/Android device Head: headaches, head injury Eyes: changes in vision, eye pain, glaucoma, cataracts, macular degeneration, diplopia, glare,  light sensitivity, eyeglasses or contacts, blindness Ears nose mouth throat: hearing impaired, hearing aids,  ringing in ears, deaf, sign language,  vertigo,   nosebleeds,  rhinitis,  cold sores, snoring, swollen glands Cardiovascular: HTN, edema, arrhythmia, pacemaker in place, defibrillator in place, chest pain/tightness, chronic anticoagulation, blood clot, heart failure, MI Peripheral Vascular: leg cramps, varicose veins, blood clots, lymphedema, varicosities Respiratory:  difficulty breathing, denies congestion, SOB, wheezing, cough, emphysema, asthma Gastrointestinal: change in appetite or weight, abdominal pain, constipation, diarrhea, nausea, vomiting, vomiting blood, change in bowel habits, abdominal pain, jaundice, rectal bleeding, hemorrhoids, GERD Genitourinary:  nocturia,  pain on urination, polyuria,  blood in urine, Foley catheter, urinary urgency, ESRD on hemodialysis Musculoskeletal: amputation, cramping, stiff joints, painful joints, decreased joint motion, fractures, OA, gout, hemiplegia, paraplegia, uses cane, wheelchair bound, uses walker, uses rollator Skin: changes in toenails, color change, dryness, itching, mole changes,  rash, wound(s) Neurological:  headaches, numbness in feet, paresthesias in feet, burning in feet, fainting,  seizures, change in speech. denies headaches, memory problems/poor historian, cerebral palsy, weakness, paralysis, CVA, TIA Endocrine:  diabetes, hypothyroidism, hyperthyroidism,  goiter, dry mouth, flushing, heat intolerance,  cold intolerance,  excessive thirst, denies polyuria,  nocturia Hematological:  easy bleeding, excessive bleeding, easy bruising, enlarged lymph nodes, on long term blood thinner, history of past transusions Allergy/immunological:  hives, eczema, frequent infections, multiple drug allergies, seasonal allergies, transplant recipient, multiple food allergies Psychiatric:  anxiety, depression, mood disorder, suicidal ideations, hallucinations, insomnia  Objective: Vitals:   12/28/18 1634  BP: (!) 140/54  Temp: 98.6 F (37 C)    Vascular Examination: Capillary refill time immediate  x 10 digits.  Dorsalis pedis and posterior tibial pulses present b/l.  Digital hair present  x 10 digits.  Skin temperature WNL b/l.  Dermatological Examination: Skin with normal turgor, texture and tone.  Toenails 1-5 b/l are painful, elongated, discolored, dystrophic with subungual debris. Pain with dorsal palpation of nailplates. No erythema, no edema, no drainage noted.   Porokeratotic lesions submet head 5 b/l with tenderness to palpation. No erythema, no edema, no drainage, no flocculence.  Diffuse scaling noted peripherally and plantarly b/l feet with mild foot odor.  No interdigital macerations.  No blisters, no weeping. No signs of secondary bacterial infection noted.  Musculoskeletal: Muscle strength 5/5 to all LE muscle groups.  Neurological: Sensation intact with 10 gram monofilament.  Vibratory sensation intact.  Assessment: 1. Painful onychomycosis 1-5 b/l 2. Porokeratosis submet head 5 left foot 3. Tinea pedis b/l   Plan: 1. Toenails 1-5 b/l debrided in length and girth. Discussed with patient coverage for paring of Porokeratosis most likely will not be covered by insurance because he does not qualify for routine foot care. He gave permission for me to trim his porokeratosis. Porokertosis   submet head 5 left foot pared and enucleated with sterile scalpel blade without incident. I have asked him to bring his work boots in on his next visit so I can modify his insole and make it more comfortable for him when at work. Rx for Ciclopirox Gel 0.77% sent to pharmacy. Patient is to apply to both feet and between toes bid x 4 weeks. Patient to continue soft, supportive shoe gear daily. Patient to report any pedal injuries to medical professional immediately. Follow up prn.  Patient/POA to call should there be a concern in the interim.

## 2019-02-14 ENCOUNTER — Other Ambulatory Visit: Payer: Self-pay

## 2019-02-14 ENCOUNTER — Telehealth: Payer: Self-pay

## 2019-02-14 DIAGNOSIS — Z20828 Contact with and (suspected) exposure to other viral communicable diseases: Secondary | ICD-10-CM

## 2019-02-14 DIAGNOSIS — Z20822 Contact with and (suspected) exposure to covid-19: Secondary | ICD-10-CM

## 2019-02-14 NOTE — Progress Notes (Signed)
covid

## 2019-02-14 NOTE — Telephone Encounter (Signed)
Patient states that a lot of people had been sick at work and he didn't feel comfortable going into work. The job told him to go get tested and he was going to go 11-12-22 but had a death in the family and had to go to a funeral.

## 2019-02-14 NOTE — Telephone Encounter (Signed)
Patient needs a note that states that he will be out of work until from 02/07/2019 until he receives he COV-19 results. Patient states that it will be a week for results to come back. Patient was tested at [facility].

## 2019-02-15 NOTE — Telephone Encounter (Signed)
Patient notified

## 2019-02-17 LAB — NOVEL CORONAVIRUS, NAA: SARS-CoV-2, NAA: NOT DETECTED

## 2019-02-22 ENCOUNTER — Encounter: Payer: Self-pay | Admitting: Family Medicine

## 2019-02-22 NOTE — Telephone Encounter (Signed)
Patient cov-19 results are negative

## 2019-02-22 NOTE — Telephone Encounter (Signed)
Start date would be 02/07/2019-02/27/2019. Patient would like to pick up note by Friday so he can get it turned into payroll

## 2019-02-23 ENCOUNTER — Telehealth: Payer: Self-pay

## 2019-02-27 NOTE — Telephone Encounter (Signed)
Patient will come by and pick up results

## 2019-02-27 NOTE — Telephone Encounter (Signed)
Patient will come by and pick up lab results

## 2019-04-17 ENCOUNTER — Ambulatory Visit: Payer: Self-pay | Admitting: Family Medicine

## 2020-08-01 ENCOUNTER — Emergency Department (HOSPITAL_COMMUNITY)
Admission: EM | Admit: 2020-08-01 | Discharge: 2020-08-01 | Disposition: A | Discharge status: Home / Self Care | Payer: HRSA Program | Attending: Emergency Medicine | Admitting: Emergency Medicine

## 2020-08-01 ENCOUNTER — Other Ambulatory Visit: Payer: Self-pay

## 2020-08-01 DIAGNOSIS — R0602 Shortness of breath: Secondary | ICD-10-CM | POA: Diagnosis not present

## 2020-08-01 DIAGNOSIS — I10 Essential (primary) hypertension: Secondary | ICD-10-CM | POA: Insufficient documentation

## 2020-08-01 DIAGNOSIS — J45909 Unspecified asthma, uncomplicated: Secondary | ICD-10-CM | POA: Diagnosis not present

## 2020-08-01 DIAGNOSIS — R519 Headache, unspecified: Secondary | ICD-10-CM | POA: Diagnosis not present

## 2020-08-01 DIAGNOSIS — R059 Cough, unspecified: Secondary | ICD-10-CM | POA: Diagnosis not present

## 2020-08-01 DIAGNOSIS — Z79899 Other long term (current) drug therapy: Secondary | ICD-10-CM | POA: Insufficient documentation

## 2020-08-01 DIAGNOSIS — M791 Myalgia, unspecified site: Secondary | ICD-10-CM | POA: Diagnosis not present

## 2020-08-01 DIAGNOSIS — R0981 Nasal congestion: Secondary | ICD-10-CM | POA: Diagnosis not present

## 2020-08-01 DIAGNOSIS — Z20822 Contact with and (suspected) exposure to covid-19: Secondary | ICD-10-CM | POA: Diagnosis not present

## 2020-08-01 DIAGNOSIS — F17210 Nicotine dependence, cigarettes, uncomplicated: Secondary | ICD-10-CM | POA: Insufficient documentation

## 2020-08-01 MED ORDER — BENZONATATE 100 MG PO CAPS: 100.0000 mg | ORAL_CAPSULE | Freq: Three times a day (TID) | ORAL | 0 refills | Status: DC

## 2020-08-01 MED ORDER — ONDANSETRON 4 MG PO TBDP: 4.0000 mg | ORAL_TABLET | Freq: Three times a day (TID) | ORAL | 0 refills | Status: DC | PRN

## 2020-08-01 MED ORDER — ACETAMINOPHEN 325 MG PO TABS: 650.0000 mg | ORAL_TABLET | Freq: Once | ORAL | Status: DC | PRN

## 2020-08-01 MED ORDER — ACETAMINOPHEN ER 650 MG PO TBCR: 650.0000 mg | EXTENDED_RELEASE_TABLET | Freq: Three times a day (TID) | ORAL | 0 refills | Status: DC | PRN

## 2020-08-01 NOTE — ED Notes (Signed)
An After Visit Summary was printed and given to the patient. Discharge instructions given and no further questions at this time.  

## 2020-08-01 NOTE — Discharge Instructions (Addendum)
As discussed, your Covid test is pending.  Results should be available within the next 24 hours.  Continue to self quarantine until your results are available.  I am sending you with cough medication, nausea medication, and Tylenol.  Take as needed.  Please follow-up with PCP if symptoms not improved within the next week.  Return to the ER for new or worsening symptoms.

## 2020-08-01 NOTE — ED Provider Notes (Signed)
[facility] DEPT Provider Note   CSN: [id number] Arrival date & time: 08/01/20  1323     History Chief Complaint  Patient presents with  . Shortness of Breath  . Generalized Body Aches  . Headache    Daniel Marshall is a 45 y.o. male with a past medical history significant for asthma and hypertension who presents to the ED due to shortness of breath, body aches, and intermittent headaches x1 week. Shortness of breath worse with exertion and while coughing. Patient denies sick contacts and known Covid exposures.  Patient also admits to nasal congestion worse in the morning with a productive cough with clear phlegm.  Patient received both his Covid vaccines, but no booster. Denies fever and chills. Denies chest pain and lower extremity edema.  Denies history of blood clots, recent surgeries, recent long immobilizations, and hormonal treatments.  Denies abdominal pain, nausea, vomiting, diarrhea. No treatment prior to arrival.  History obtained from patient and past medical records. No interpreter used during encounter.      Past Medical History:  Diagnosis Date  . Asthma    as a child  . Broken ribs   . Hypertension     Patient Active Problem List   Diagnosis Date Noted  . Mild asthma 10/16/2018  . Wheezes 10/16/2018  . Shortness of breath 10/16/2018  . Coughing 08/16/2017  . Bronchospasm 08/16/2017    No past surgical history on file.     Family History  Family history unknown: Yes    Social History   Tobacco Use  . Smoking status: Current Every Day Smoker    Types: Cigarettes  . Smokeless tobacco: Never Used  Substance Use Topics  . Alcohol use: Yes  . Drug use: Yes    Types: Marijuana    Home Medications Prior to Admission medications   Medication Sig Start Date End Date Taking? Authorizing Provider  acetaminophen (TYLENOL 8 HOUR) 650 MG CR tablet Take 1 tablet (650 mg total) by mouth every 8 (eight) hours as needed for pain.  08/01/20  Yes Claiborne Stroble, Druscilla Brownie, PA-C  benzonatate (TESSALON) 100 MG capsule Take 1 capsule (100 mg total) by mouth every 8 (eight) hours. 08/01/20  Yes Kalyani Maeda C, PA-C  ondansetron (ZOFRAN ODT) 4 MG disintegrating tablet Take 1 tablet (4 mg total) by mouth every 8 (eight) hours as needed for nausea or vomiting. 08/01/20  Yes Raquel Sayres C, PA-C  albuterol (PROVENTIL HFA;VENTOLIN HFA) 108 (90 Base) MCG/ACT inhaler Inhale 2 puffs into the lungs every 4 (four) hours as needed for wheezing or shortness of breath (cough, shortness of breath or wheezing.). 10/14/18   Azzie Glatter, FNP  Ciclopirox 0.77 % gel Apply to both feet and between toes bid for 4 weeks. 12/28/18   Marzetta Board, DPM  cyclobenzaprine (FLEXERIL) 10 MG tablet Take 1 tablet (10 mg total) by mouth 2 (two) times daily as needed for muscle spasms. 10/14/18   Azzie Glatter, FNP  lidocaine (LIDODERM) 5 % Place 1 patch onto the skin daily. Remove & Discard patch within 12 hours or as directed by MD 03/07/18   Volanda Napoleon, PA-C  methocarbamol (ROBAXIN) 500 MG tablet Take 1 tablet (500 mg total) by mouth 2 (two) times daily. 03/07/18   Volanda Napoleon, PA-C    Allergies    Patient has no known allergies.  Review of Systems   Review of Systems  Constitutional: Negative for chills and fever.  HENT: Positive for  congestion. Negative for sore throat.   Respiratory: Positive for cough and shortness of breath.   Cardiovascular: Negative for chest pain and leg swelling.  Gastrointestinal: Negative for abdominal pain, diarrhea, nausea and vomiting.  Neurological: Positive for headaches.  All other systems reviewed and are negative.   Physical Exam Updated Vital Signs BP (!) 162/87   Pulse 96   Temp 99 F (37.2 C) (Oral)   Resp 18   Ht 5\' 6"  (1.676 m)   Wt 75.3 kg   SpO2 98%   BMI 26.78 kg/m   Physical Exam Vitals and nursing note reviewed.  Constitutional:      General: He is not in acute distress.     Appearance: He is not ill-appearing.  HENT:     Head: Normocephalic.  Eyes:     Pupils: Pupils are equal, round, and reactive to light.  Neck:     Comments: No meningismus. Cardiovascular:     Rate and Rhythm: Normal rate and regular rhythm.     Pulses: Normal pulses.     Heart sounds: Normal heart sounds. No murmur heard. No friction rub. No gallop.   Pulmonary:     Effort: Pulmonary effort is normal.     Breath sounds: Normal breath sounds.     Comments: Respirations equal and unlabored, patient able to speak in full sentences, lungs clear to auscultation bilaterally Abdominal:     General: Abdomen is flat. There is no distension.     Palpations: Abdomen is soft.     Tenderness: There is no abdominal tenderness. There is no guarding or rebound.  Musculoskeletal:     Cervical back: Neck supple.     Comments: No lower extremity edema. Negative homan sign bilaterally.  Skin:    General: Skin is warm and dry.  Neurological:     General: No focal deficit present.     Mental Status: He is alert.     ED Results / Procedures / Treatments   Labs (all labs ordered are listed, but only abnormal results are displayed) Labs Reviewed  SARS CORONAVIRUS 2 (TAT 6-24 HRS)    EKG None  Radiology No results found.  Procedures Procedures (including critical care time)  Medications Ordered in ED Medications  acetaminophen (TYLENOL) tablet 650 mg (has no administration in time range)    ED Course  I have reviewed the triage vital signs and the nursing notes.  Pertinent labs & imaging results that were available during my care of the patient were reviewed by me and considered in my medical decision making (see chart for details).    MDM Rules/Calculators/A&P                         45 year old male presents to the ED due to shortness of breath, body aches, intermittent headaches x1 week.  No sick contacts or known Covid exposures.  He has received both his Covid vaccines.  Stable vitals. Patient is afebrile, not tachycardic or hypoxic.  Patient in no acute distress and nontoxic-appearing.  Physical exam reassuring.  Lungs clear to auscultation bilaterally.  No rales, rhonchi, or wheeze.  Low suspicion for pneumonia.  No meningismus to suggest meningitis.  Throat with mild erythema and no tonsillar hypertrophy or exudates.  No abscess appreciated on exam.  Abdomen soft, nondistended, nontender.  No lower extremity edema.  PERC negative and low risk using Wells criteria.  Doubt PE/DVT. suspect symptoms related to Covid infection versus other viral etiology.  COVID test pending.  Patient able to ambulate in the ED and maintained O2 saturation above 96% without difficulty.  Patient discharged with symptomatic treatment.  Quarantine guidelines discussed. Strict ED precautions discussed with patient. Patient states understanding and agrees to plan. Patient discharged home in no acute distress and stable vitals.  Daniel Marshall was evaluated in Emergency Department on 08/01/2020 for the symptoms described in the history of present illness. He was evaluated in the context of the global COVID-19 pandemic, which necessitated consideration that the patient might be at risk for infection with the SARS-CoV-2 virus that causes COVID-19. Institutional protocols and algorithms that pertain to the evaluation of patients at risk for COVID-19 are in a state of rapid change based on information released by regulatory bodies including the CDC and federal and state organizations. These policies and algorithms were followed during the patient's care in the ED.  Final Clinical Impression(s) / ED Diagnoses Final diagnoses:  Suspected COVID-19 virus infection    Rx / DC Orders ED Discharge Orders         Ordered    benzonatate (TESSALON) 100 MG capsule  Every 8 hours        08/01/20 1715    ondansetron (ZOFRAN ODT) 4 MG disintegrating tablet  Every 8 hours PRN        08/01/20 1715    acetaminophen  (TYLENOL 8 HOUR) 650 MG CR tablet  Every 8 hours PRN        08/01/20 1715           Karie Kirks 08/01/20 1717    Blanchie Dessert, MD 08/05/20 2251

## 2020-08-01 NOTE — ED Notes (Signed)
Pt ambulated in room unassisted with a steady gait. Pt O2 reading 97%-99% RA.

## 2020-08-01 NOTE — ED Triage Notes (Signed)
Pt POV reports shortness of breath, bodyaches and headache x1 week.

## 2020-08-02 LAB — SARS CORONAVIRUS 2 (TAT 6-24 HRS): SARS Coronavirus 2: NEGATIVE

## 2020-08-03 ENCOUNTER — Ambulatory Visit: Payer: Self-pay

## 2022-01-06 ENCOUNTER — Emergency Department (HOSPITAL_COMMUNITY): Payer: Self-pay

## 2022-01-06 ENCOUNTER — Emergency Department (HOSPITAL_COMMUNITY)
Admission: EM | Admit: 2022-01-06 | Discharge: 2022-01-06 | Disposition: A | Discharge status: Home / Self Care | Payer: Self-pay | Attending: Emergency Medicine | Admitting: Emergency Medicine

## 2022-01-06 ENCOUNTER — Encounter (HOSPITAL_COMMUNITY): Payer: Self-pay | Admitting: Emergency Medicine

## 2022-01-06 ENCOUNTER — Other Ambulatory Visit: Payer: Self-pay

## 2022-01-06 DIAGNOSIS — Z7951 Long term (current) use of inhaled steroids: Secondary | ICD-10-CM | POA: Insufficient documentation

## 2022-01-06 DIAGNOSIS — I10 Essential (primary) hypertension: Secondary | ICD-10-CM | POA: Insufficient documentation

## 2022-01-06 DIAGNOSIS — Y908 Blood alcohol level of 240 mg/100 ml or more: Secondary | ICD-10-CM | POA: Insufficient documentation

## 2022-01-06 DIAGNOSIS — J441 Chronic obstructive pulmonary disease with (acute) exacerbation: Secondary | ICD-10-CM | POA: Insufficient documentation

## 2022-01-06 DIAGNOSIS — Z20822 Contact with and (suspected) exposure to covid-19: Secondary | ICD-10-CM | POA: Insufficient documentation

## 2022-01-06 DIAGNOSIS — J45909 Unspecified asthma, uncomplicated: Secondary | ICD-10-CM | POA: Insufficient documentation

## 2022-01-06 LAB — RESP PANEL BY RT-PCR (FLU A&B, COVID) ARPGX2
Influenza A by PCR: NEGATIVE
Influenza B by PCR: NEGATIVE
SARS Coronavirus 2 by RT PCR: NEGATIVE

## 2022-01-06 LAB — CBC
HCT: 38.3 % — ABNORMAL LOW (ref 39.0–52.0)
Hemoglobin: 13.3 g/dL (ref 13.0–17.0)
MCH: 30.8 pg (ref 26.0–34.0)
MCHC: 34.7 g/dL (ref 30.0–36.0)
MCV: 88.7 fL (ref 80.0–100.0)
Platelets: 153 10*3/uL (ref 150–400)
RBC: 4.32 MIL/uL (ref 4.22–5.81)
RDW: 15.9 % — ABNORMAL HIGH (ref 11.5–15.5)
WBC: 5.5 10*3/uL (ref 4.0–10.5)
nRBC: 0 % (ref 0.0–0.2)

## 2022-01-06 LAB — BLOOD GAS, VENOUS
Acid-Base Excess: 6.3 mmol/L — ABNORMAL HIGH (ref 0.0–2.0)
Bicarbonate: 32.3 mmol/L — ABNORMAL HIGH (ref 20.0–28.0)
O2 Saturation: 68.5 %
Patient temperature: 37
pCO2, Ven: 51 mmHg (ref 44–60)
pH, Ven: 7.41 (ref 7.25–7.43)
pO2, Ven: 41 mmHg (ref 32–45)

## 2022-01-06 LAB — D-DIMER, QUANTITATIVE: D-Dimer, Quant: 1.08 ug/mL-FEU — ABNORMAL HIGH (ref 0.00–0.50)

## 2022-01-06 LAB — COMPREHENSIVE METABOLIC PANEL
ALT: 44 U/L (ref 0–44)
AST: 98 U/L — ABNORMAL HIGH (ref 15–41)
Albumin: 3.4 g/dL — ABNORMAL LOW (ref 3.5–5.0)
Alkaline Phosphatase: 119 U/L (ref 38–126)
Anion gap: 8 (ref 5–15)
BUN: 5 mg/dL — ABNORMAL LOW (ref 6–20)
CO2: 30 mmol/L (ref 22–32)
Calcium: 8.2 mg/dL — ABNORMAL LOW (ref 8.9–10.3)
Chloride: 99 mmol/L (ref 98–111)
Creatinine, Ser: 0.61 mg/dL (ref 0.61–1.24)
GFR, Estimated: >60 mL/min (ref >60)
Glucose, Bld: 92 mg/dL (ref 70–99)
Potassium: 4.3 mmol/L (ref 3.5–5.1)
Sodium: 137 mmol/L (ref 135–145)
Total Bilirubin: 0.9 mg/dL (ref 0.3–1.2)
Total Protein: 8.1 g/dL (ref 6.5–8.1)

## 2022-01-06 LAB — ETHANOL: Alcohol, Ethyl (B): 267 mg/dL — ABNORMAL HIGH (ref <10)

## 2022-01-06 LAB — MAGNESIUM: Magnesium: 2 mg/dL (ref 1.7–2.4)

## 2022-01-06 LAB — BRAIN NATRIURETIC PEPTIDE: B Natriuretic Peptide: 19.8 pg/mL (ref 0.0–100.0)

## 2022-01-06 MED ORDER — THIAMINE HCL 100 MG/ML IJ SOLN
100.0000 mg | Freq: Once | INTRAMUSCULAR | Status: AC
  Administered 2022-01-06: 100 mg via INTRAVENOUS
  Filled 2022-01-06: qty 2

## 2022-01-06 MED ORDER — PREDNISONE 10 MG PO TABS: 40.0000 mg | ORAL_TABLET | Freq: Every day | ORAL | 0 refills | Status: AC

## 2022-01-06 MED ORDER — ALBUTEROL SULFATE HFA 108 (90 BASE) MCG/ACT IN AERS
2.0000 | INHALATION_SPRAY | RESPIRATORY_TRACT | Status: DC | PRN
  Filled 2022-01-06: qty 6.7

## 2022-01-06 MED ORDER — DOXYCYCLINE HYCLATE 100 MG PO CAPS: 100.0000 mg | ORAL_CAPSULE | Freq: Two times a day (BID) | ORAL | 0 refills | Status: AC

## 2022-01-06 MED ORDER — METHYLPREDNISOLONE SODIUM SUCC 125 MG IJ SOLR
125.0000 mg | Freq: Once | INTRAMUSCULAR | Status: AC
Start: 2022-01-06 — End: 2022-01-06
  Administered 2022-01-06: 125 mg via INTRAVENOUS
  Filled 2022-01-06: qty 2

## 2022-01-06 MED ORDER — IPRATROPIUM-ALBUTEROL 0.5-2.5 (3) MG/3ML IN SOLN
3.0000 mL | Freq: Once | RESPIRATORY_TRACT | Status: AC
Start: 2022-01-06 — End: 2022-01-06
  Administered 2022-01-06: 3 mL via RESPIRATORY_TRACT
  Filled 2022-01-06: qty 3

## 2022-01-06 MED ORDER — LACTATED RINGERS IV BOLUS
1000.0000 mL | Freq: Once | INTRAVENOUS | Status: AC
  Administered 2022-01-06: 1000 mL via INTRAVENOUS

## 2022-01-06 MED ORDER — IOHEXOL 350 MG/ML SOLN
100.0000 mL | Freq: Once | INTRAVENOUS | Status: AC | PRN
  Administered 2022-01-06: 80 mL via INTRAVENOUS

## 2022-01-06 MED ORDER — ALBUTEROL SULFATE HFA 108 (90 BASE) MCG/ACT IN AERS
2.0000 | INHALATION_SPRAY | Freq: Once | RESPIRATORY_TRACT | Status: AC
  Administered 2022-01-06: 2 via RESPIRATORY_TRACT

## 2022-01-06 NOTE — Discharge Instructions (Signed)
There were medications sent to your pharmacy.  These are for treatment of COPD.  Take as prescribed.  Continue to use your inhaler at home as needed.  Return to the emergency department if you experience any worsening severity of symptoms.  Additionally, there is a telephone number below to call to set up a follow-up appointment with the ear, nose, and throat doctor.  Schedule an appointment for further evaluation of your vocal cords.

## 2022-01-06 NOTE — ED Provider Notes (Signed)
[facility] DEPT Provider Note   CSN: [id number] Arrival date & time: 01/06/22  0145     History {Add pertinent medical, surgical, social history, OB history to HPI:1} Chief Complaint  Patient presents with   Shortness of Breath    Daniel Marshall is a 46 y.o. male.   Shortness of Breath Associated symptoms: cough   Associated symptoms: no chest pain, no fever, no sore throat, no vomiting and no wheezing   Patient presents for shortness of breath.  Medical history includes HTN and asthma, although he states that he had childhood asthma only and has not had any symptoms of asthma as an adult.  Patient reports that he has been experiencing shortness of breath over the past several weeks.  He has had a nonproductive cough.  He has not felt fevers or chills.  He denies any episodes of pain in his chest.  He does not feel chest tightness.  He does feel some throat tightness.  He feels that he has had some worsening of his voice.  He does not have any throat pain.  He has not had difficulty swallowing.  Patient works in a [employer], [employer]     Home Medications Prior to Admission medications   Medication Sig Start Date End Date Taking? Authorizing Provider  acetaminophen (TYLENOL 8 HOUR) 650 MG CR tablet Take 1 tablet (650 mg total) by mouth every 8 (eight) hours as needed for pain. 08/01/20   Suzy Bouchard, PA-C  albuterol (PROVENTIL HFA;VENTOLIN HFA) 108 (90 Base) MCG/ACT inhaler Inhale 2 puffs into the lungs every 4 (four) hours as needed for wheezing or shortness of breath (cough, shortness of breath or wheezing.). 10/14/18   Azzie Glatter, FNP  benzonatate (TESSALON) 100 MG capsule Take 1 capsule (100 mg total) by mouth every 8 (eight) hours. 08/01/20   Suzy Bouchard, PA-C  Ciclopirox 0.77 % gel Apply to both feet and between toes bid for 4 weeks. 12/28/18   Marzetta Board, DPM  cyclobenzaprine (FLEXERIL) 10 MG tablet Take 1 tablet (10  mg total) by mouth 2 (two) times daily as needed for muscle spasms. 10/14/18   Azzie Glatter, FNP  lidocaine (LIDODERM) 5 % Place 1 patch onto the skin daily. Remove & Discard patch within 12 hours or as directed by MD 03/07/18   Volanda Napoleon, PA-C  methocarbamol (ROBAXIN) 500 MG tablet Take 1 tablet (500 mg total) by mouth 2 (two) times daily. 03/07/18   Volanda Napoleon, PA-C  ondansetron (ZOFRAN ODT) 4 MG disintegrating tablet Take 1 tablet (4 mg total) by mouth every 8 (eight) hours as needed for nausea or vomiting. 08/01/20   Suzy Bouchard, PA-C      Allergies    Patient has no known allergies.    Review of Systems   Review of Systems  Constitutional:  Negative for chills, fatigue and fever.  HENT:  Positive for voice change. Negative for sore throat and trouble swallowing.   Respiratory:  Positive for cough and shortness of breath. Negative for chest tightness, wheezing and stridor.   Cardiovascular:  Negative for chest pain, palpitations and leg swelling.  Gastrointestinal:  Negative for diarrhea and vomiting.  All other systems reviewed and are negative.   Physical Exam Updated Vital Signs BP (!) 135/119   Pulse 98   Temp 98.3 F (36.8 C) (Oral)   Resp 18   Ht '5\' 6"'$  (1.676 m)   Wt 83.9 kg  SpO2 91%   BMI 29.86 kg/m  Physical Exam Vitals and nursing note reviewed.  Constitutional:      General: He is not in acute distress.    Appearance: He is well-developed and normal weight. He is not ill-appearing, toxic-appearing or diaphoretic.  HENT:     Head: Normocephalic and atraumatic.     Mouth/Throat:     Mouth: Mucous membranes are moist.     Pharynx: Oropharynx is clear.  Eyes:     Conjunctiva/sclera: Conjunctivae normal.  Neck:     Vascular: No JVD.  Cardiovascular:     Rate and Rhythm: Normal rate and regular rhythm.     Heart sounds: No murmur heard. Pulmonary:     Effort: Pulmonary effort is normal. No tachypnea, accessory muscle usage or  respiratory distress.     Breath sounds: Decreased breath sounds present. No wheezing, rhonchi or rales.  Chest:     Chest wall: No tenderness.  Abdominal:     Palpations: Abdomen is soft.     Tenderness: There is no abdominal tenderness.  Musculoskeletal:        General: No swelling. Normal range of motion.     Cervical back: Normal range of motion and neck supple.     Right lower leg: No edema.     Left lower leg: No edema.  Skin:    General: Skin is warm and dry.     Capillary Refill: Capillary refill takes less than 2 seconds.     Coloration: Skin is not cyanotic or pale.  Neurological:     General: No focal deficit present.     Mental Status: He is alert and oriented to person, place, and time.     Cranial Nerves: No cranial nerve deficit.     Motor: No weakness.  Psychiatric:        Mood and Affect: Mood normal.        Behavior: Behavior normal.     ED Results / Procedures / Treatments   Labs (all labs ordered are listed, but only abnormal results are displayed) Labs Reviewed - No data to display  EKG None  Radiology DG Chest 2 View  Result Date: 01/06/2022 CLINICAL DATA:  Shortness of breath EXAM: CHEST - 2 VIEW COMPARISON:  05/05/2018 FINDINGS: The heart size and mediastinal contours are within normal limits. Both lungs are clear. The visualized skeletal structures are unremarkable. IMPRESSION: Normal study. Electronically Signed   By: Rolm Baptise M.D.   On: 01/06/2022 02:47    Procedures Procedures  {Document cardiac monitor, telemetry assessment procedure when appropriate:1}  Medications Ordered in ED Medications  albuterol (VENTOLIN HFA) 108 (90 Base) MCG/ACT inhaler 2 puff (has no administration in time range)    ED Course/ Medical Decision Making/ A&P                           Medical Decision Making Amount and/or Complexity of Data Reviewed Labs: ordered. Radiology: ordered.  Risk Prescription drug management.   ***  {Document critical  care time when appropriate:1} {Document review of labs and clinical decision tools ie heart score, Chads2Vasc2 etc:1}  {Document your independent review of radiology images, and any outside records:1} {Document your discussion with family members, caretakers, and with consultants:1} {Document social determinants of health affecting pt's care:1} {Document your decision making why or why not admission, treatments were needed:1} Final Clinical Impression(s) / ED Diagnoses Final diagnoses:  None    Rx /  DC Orders ED Discharge Orders     None       

## 2022-01-06 NOTE — ED Triage Notes (Signed)
Pt reports feeling SOB at night, worsening tonight

## 2022-06-10 ENCOUNTER — Emergency Department (HOSPITAL_COMMUNITY): Payer: Self-pay

## 2022-06-10 ENCOUNTER — Observation Stay (HOSPITAL_COMMUNITY): Payer: Self-pay

## 2022-06-10 ENCOUNTER — Observation Stay (HOSPITAL_COMMUNITY)
Admission: EM | Admit: 2022-06-10 | Discharge: 2022-06-10 | Discharge status: Against Medical Advice | Payer: Self-pay | Attending: Critical Care Medicine | Admitting: Critical Care Medicine

## 2022-06-10 DIAGNOSIS — E440 Moderate protein-calorie malnutrition: Secondary | ICD-10-CM | POA: Insufficient documentation

## 2022-06-10 DIAGNOSIS — R061 Stridor: Secondary | ICD-10-CM

## 2022-06-10 DIAGNOSIS — E46 Unspecified protein-calorie malnutrition: Secondary | ICD-10-CM | POA: Insufficient documentation

## 2022-06-10 DIAGNOSIS — R9389 Abnormal findings on diagnostic imaging of other specified body structures: Secondary | ICD-10-CM

## 2022-06-10 DIAGNOSIS — IMO0001 Reserved for inherently not codable concepts without codable children: Secondary | ICD-10-CM | POA: Diagnosis present

## 2022-06-10 DIAGNOSIS — Z1152 Encounter for screening for COVID-19: Secondary | ICD-10-CM | POA: Insufficient documentation

## 2022-06-10 DIAGNOSIS — R221 Localized swelling, mass and lump, neck: Secondary | ICD-10-CM

## 2022-06-10 DIAGNOSIS — I10 Essential (primary) hypertension: Secondary | ICD-10-CM | POA: Insufficient documentation

## 2022-06-10 DIAGNOSIS — Z7189 Other specified counseling: Secondary | ICD-10-CM

## 2022-06-10 DIAGNOSIS — F17210 Nicotine dependence, cigarettes, uncomplicated: Secondary | ICD-10-CM | POA: Insufficient documentation

## 2022-06-10 DIAGNOSIS — J439 Emphysema, unspecified: Secondary | ICD-10-CM | POA: Insufficient documentation

## 2022-06-10 DIAGNOSIS — F17200 Nicotine dependence, unspecified, uncomplicated: Secondary | ICD-10-CM | POA: Diagnosis present

## 2022-06-10 DIAGNOSIS — J9601 Acute respiratory failure with hypoxia: Secondary | ICD-10-CM | POA: Diagnosis present

## 2022-06-10 DIAGNOSIS — J441 Chronic obstructive pulmonary disease with (acute) exacerbation: Principal | ICD-10-CM | POA: Diagnosis present

## 2022-06-10 DIAGNOSIS — J45909 Unspecified asthma, uncomplicated: Secondary | ICD-10-CM | POA: Insufficient documentation

## 2022-06-10 LAB — RESPIRATORY PANEL BY PCR

## 2022-06-10 LAB — RESP PANEL BY RT-PCR (FLU A&B, COVID) ARPGX2
Influenza A by PCR: NEGATIVE
Influenza B by PCR: NEGATIVE
SARS Coronavirus 2 by RT PCR: NEGATIVE

## 2022-06-10 LAB — HIV ANTIBODY (ROUTINE TESTING W REFLEX): HIV Screen 4th Generation wRfx: NONREACTIVE

## 2022-06-10 LAB — BLOOD GAS, VENOUS
Acid-Base Excess: 2.5 mmol/L — ABNORMAL HIGH (ref 0.0–2.0)
Bicarbonate: 29.3 mmol/L — ABNORMAL HIGH (ref 20.0–28.0)
O2 Saturation: 81.8 %
Patient temperature: 37
pCO2, Ven: 53 mmHg (ref 44–60)
pH, Ven: 7.35 (ref 7.25–7.43)
pO2, Ven: 53 mmHg — ABNORMAL HIGH (ref 32–45)

## 2022-06-10 LAB — CBC WITH DIFFERENTIAL/PLATELET
Abs Immature Granulocytes: 0.03 10*3/uL (ref 0.00–0.07)
Basophils Absolute: 0 10*3/uL (ref 0.0–0.1)
Basophils Relative: 1 %
Eosinophils Absolute: 0 10*3/uL (ref 0.0–0.5)
Eosinophils Relative: 1 %
HCT: 44 % (ref 39.0–52.0)
Hemoglobin: 14.8 g/dL (ref 13.0–17.0)
Immature Granulocytes: 0 %
Lymphocytes Relative: 21 %
Lymphs Abs: 1.8 10*3/uL (ref 0.7–4.0)
MCH: 29.5 pg (ref 26.0–34.0)
MCHC: 33.6 g/dL (ref 30.0–36.0)
MCV: 87.6 fL (ref 80.0–100.0)
Monocytes Absolute: 1 10*3/uL (ref 0.1–1.0)
Monocytes Relative: 11 %
Neutro Abs: 5.8 10*3/uL (ref 1.7–7.7)
Neutrophils Relative %: 66 %
Platelets: 190 10*3/uL (ref 150–400)
RBC: 5.02 MIL/uL (ref 4.22–5.81)
RDW: 15.3 % (ref 11.5–15.5)
WBC: 8.7 10*3/uL (ref 4.0–10.5)
nRBC: 0 % (ref 0.0–0.2)

## 2022-06-10 LAB — BASIC METABOLIC PANEL
Anion gap: 14 (ref 5–15)
BUN: 10 mg/dL (ref 6–20)
CO2: 26 mmol/L (ref 22–32)
Calcium: 9.3 mg/dL (ref 8.9–10.3)
Chloride: 91 mmol/L — ABNORMAL LOW (ref 98–111)
Creatinine, Ser: 0.74 mg/dL (ref 0.61–1.24)
GFR, Estimated: >60 mL/min (ref >60)
Glucose, Bld: 118 mg/dL — ABNORMAL HIGH (ref 70–99)
Potassium: 3.5 mmol/L (ref 3.5–5.1)
Sodium: 131 mmol/L — ABNORMAL LOW (ref 135–145)

## 2022-06-10 LAB — MRSA NEXT GEN BY PCR, NASAL: MRSA by PCR Next Gen: NOT DETECTED

## 2022-06-10 MED ORDER — BUDESONIDE 0.25 MG/2ML IN SUSP
0.2500 mg | Freq: Two times a day (BID) | RESPIRATORY_TRACT | Status: DC
  Administered 2022-06-10: 0.25 mg via RESPIRATORY_TRACT
  Filled 2022-06-10: qty 2

## 2022-06-10 MED ORDER — NICOTINE 14 MG/24HR TD PT24
14.0000 mg | MEDICATED_PATCH | Freq: Every day | TRANSDERMAL | Status: DC
  Administered 2022-06-10: 14 mg via TRANSDERMAL
  Filled 2022-06-10: qty 1

## 2022-06-10 MED ORDER — RACEPINEPHRINE HCL 2.25 % IN NEBU: 0.5000 mL | INHALATION_SOLUTION | RESPIRATORY_TRACT | Status: DC | PRN

## 2022-06-10 MED ORDER — ALBUTEROL SULFATE (2.5 MG/3ML) 0.083% IN NEBU
10.0000 mg/h | INHALATION_SOLUTION | Freq: Once | RESPIRATORY_TRACT | Status: AC
  Administered 2022-06-10: 10 mg/h via RESPIRATORY_TRACT
  Filled 2022-06-10: qty 12

## 2022-06-10 MED ORDER — MAGNESIUM SULFATE 2 GM/50ML IV SOLN
2.0000 g | INTRAVENOUS | Status: AC
  Administered 2022-06-10: 2 g via INTRAVENOUS
  Filled 2022-06-10: qty 50

## 2022-06-10 MED ORDER — UMECLIDINIUM BROMIDE 62.5 MCG/ACT IN AEPB
1.0000 | INHALATION_SPRAY | Freq: Every day | RESPIRATORY_TRACT | Status: DC
  Filled 2022-06-10: qty 7

## 2022-06-10 MED ORDER — IPRATROPIUM BROMIDE 0.02 % IN SOLN
0.5000 mg | Freq: Once | RESPIRATORY_TRACT | Status: AC
  Administered 2022-06-10: 0.5 mg via RESPIRATORY_TRACT

## 2022-06-10 MED ORDER — REVEFENACIN 175 MCG/3ML IN SOLN
175.0000 ug | Freq: Every day | RESPIRATORY_TRACT | Status: DC
  Administered 2022-06-10: 175 ug via RESPIRATORY_TRACT
  Filled 2022-06-10: qty 3

## 2022-06-10 MED ORDER — LORAZEPAM 2 MG/ML IJ SOLN
0.5000 mg | Freq: Once | INTRAMUSCULAR | Status: AC
  Administered 2022-06-10: 0.5 mg via INTRAVENOUS
  Filled 2022-06-10: qty 1

## 2022-06-10 MED ORDER — SODIUM CHLORIDE 0.9 % IV SOLN
1.0000 g | INTRAVENOUS | Status: DC
  Administered 2022-06-10: 1 g via INTRAVENOUS
  Filled 2022-06-10: qty 10

## 2022-06-10 MED ORDER — ORAL CARE MOUTH RINSE: 15.0000 mL | OROMUCOSAL | Status: DC

## 2022-06-10 MED ORDER — ENOXAPARIN SODIUM 40 MG/0.4ML IJ SOSY
40.0000 mg | PREFILLED_SYRINGE | INTRAMUSCULAR | Status: DC
  Administered 2022-06-10: 40 mg via SUBCUTANEOUS
  Filled 2022-06-10: qty 0.4

## 2022-06-10 MED ORDER — IOHEXOL 300 MG/ML  SOLN
75.0000 mL | Freq: Once | INTRAMUSCULAR | Status: AC | PRN
  Administered 2022-06-10: 75 mL via INTRAVENOUS

## 2022-06-10 MED ORDER — IPRATROPIUM BROMIDE 0.02 % IN SOLN
0.5000 mg | Freq: Once | RESPIRATORY_TRACT | Status: AC
  Administered 2022-06-10: 0.5 mg via RESPIRATORY_TRACT
  Filled 2022-06-10: qty 2.5

## 2022-06-10 MED ORDER — ARFORMOTEROL TARTRATE 15 MCG/2ML IN NEBU
15.0000 ug | INHALATION_SOLUTION | Freq: Two times a day (BID) | RESPIRATORY_TRACT | Status: DC
  Administered 2022-06-10: 15 ug via RESPIRATORY_TRACT
  Filled 2022-06-10: qty 2

## 2022-06-10 MED ORDER — SODIUM CHLORIDE 0.9 % IV SOLN
500.0000 mg | INTRAVENOUS | Status: DC
  Administered 2022-06-10: 500 mg via INTRAVENOUS
  Filled 2022-06-10: qty 5

## 2022-06-10 MED ORDER — ALBUTEROL SULFATE (2.5 MG/3ML) 0.083% IN NEBU
20.0000 mg | INHALATION_SOLUTION | Freq: Once | RESPIRATORY_TRACT | Status: AC
  Administered 2022-06-10: 20 mg via RESPIRATORY_TRACT

## 2022-06-10 MED ORDER — ORAL CARE MOUTH RINSE: 15.0000 mL | OROMUCOSAL | Status: DC | PRN

## 2022-06-10 MED ORDER — CHLORHEXIDINE GLUCONATE CLOTH 2 % EX PADS
6.0000 | MEDICATED_PAD | Freq: Every day | CUTANEOUS | Status: DC
  Administered 2022-06-10: 6 via TOPICAL

## 2022-06-10 MED ORDER — ALBUTEROL SULFATE (2.5 MG/3ML) 0.083% IN NEBU
2.5000 mg | INHALATION_SOLUTION | RESPIRATORY_TRACT | Status: DC | PRN
  Administered 2022-06-10 (×2): 2.5 mg via RESPIRATORY_TRACT
  Filled 2022-06-10 (×3): qty 3

## 2022-06-10 MED ORDER — HYDRALAZINE HCL 20 MG/ML IJ SOLN
10.0000 mg | INTRAMUSCULAR | Status: DC | PRN
  Administered 2022-06-10: 10 mg via INTRAVENOUS
  Filled 2022-06-10: qty 1

## 2022-06-10 MED ORDER — LORAZEPAM 2 MG/ML IJ SOLN
0.5000 mg | INTRAMUSCULAR | Status: DC | PRN
  Administered 2022-06-10: 0.5 mg via INTRAVENOUS
  Filled 2022-06-10: qty 1

## 2022-06-10 MED ORDER — METHYLPREDNISOLONE SODIUM SUCC 125 MG IJ SOLR
60.0000 mg | Freq: Two times a day (BID) | INTRAMUSCULAR | Status: DC
  Administered 2022-06-10: 60 mg via INTRAVENOUS
  Filled 2022-06-10: qty 2

## 2022-06-10 MED ORDER — IPRATROPIUM-ALBUTEROL 0.5-2.5 (3) MG/3ML IN SOLN
3.0000 mL | RESPIRATORY_TRACT | Status: DC
  Administered 2022-06-10: 3 mL via RESPIRATORY_TRACT
  Filled 2022-06-10 (×2): qty 3

## 2022-06-10 MED ORDER — MOMETASONE FURO-FORMOTEROL FUM 200-5 MCG/ACT IN AERO
2.0000 | INHALATION_SPRAY | Freq: Two times a day (BID) | RESPIRATORY_TRACT | Status: DC
  Filled 2022-06-10: qty 8.8

## 2022-06-10 NOTE — Progress Notes (Signed)
PT placed on bipap per MD order.

## 2022-06-10 NOTE — Assessment & Plan Note (Signed)
Due to COPD exacerbation Requiring rescue BIPAP still.

## 2022-06-10 NOTE — Progress Notes (Signed)
Daniel Marshall wants to leave and have his procedure as an outpatient. We have discussed multiple times that I feel this is unsafe and his surgery will be delayed and harder to schedule as an outpatient. He understands my concern that he may decompensate and is at higher risk of dying if he cannot get a trach quickly enough and it is a less safe procedure if he is unstable. He understands that if he leaves, he will be leaving against medical advice. I will not be discharging him from the hospital before he has a tracheostomy placed.  Julian Hy, DO 06/10/22 4:02 PM [facility]

## 2022-06-10 NOTE — Hospital Course (Signed)
46 y.o. male with medical history significant of HTN, recent diagnosis of emphysema on CT scan earlier this year with 2 COPD exacerbations already this year.   Pt presented to ED with respiratory distress

## 2022-06-10 NOTE — Progress Notes (Signed)
PT Cancellation Note  Patient Details Name: Daniel Marshall MRN: [id number] DOB: 1975-11-12   Cancelled Treatment:    Reason Eval/Treat Not Completed: Medical issues which prohibited therapy, in red MEWS. Will check back tomorrow. [place] Office [phone] Weekend [phone]    Claretha Cooper 06/10/2022, 12:00 PM

## 2022-06-10 NOTE — Progress Notes (Signed)
Patient requesting to leave AMA. Dr. Carlis Abbott to bedside and explained significant risk of harm and death that is likely to occur if patient leaves the hospital AMA. Patient stated he understands and appreciates our concern and he is insistent on leaving the hospital now. Patient reports he will return to [facility] ED on Thursday morning after he "takes care of business" at home. Patient A&Ox4 and states he understands the risks of signing out AMA. Offered to call patients girlfriend or other family and patient refuses at this time. AMA paper signed and placed in chart. IVs removed and patient ambulated to hospital entrance.

## 2022-06-10 NOTE — Consult Note (Signed)
Reason for Consult:stridor Referring Physician: Dr Pearla Dubonnet is an 47 y.o. male.  HPI: hx of stridor for many months. No pain in the throat. Recently started having increased stridor and admitted. He sometimes has normal breathing. He has hoarseness for months and also fluctuating. He has no dysphagia or odynophagia. No bleeding. He has strong smoking hx  Past Medical History:  Diagnosis Date   Asthma    as a child   Broken ribs    Hypertension     No past surgical history on file.  Family History  Family history unknown: Yes    Social History:  reports that he has been smoking cigarettes. He has never used smokeless tobacco. He reports current alcohol use. He reports current drug use. Drug: Marijuana.  Allergies: No Known Allergies  Medications: I have reviewed the patient's current medications.  Results for orders placed or performed during the hospital encounter of 06/10/22 (from the past 48 hour(s))  CBC with Differential     Status: None   Collection Time: 06/10/22  1:25 AM  Result Value Ref Range   WBC 8.7 4.0 - 10.5 K/uL   RBC 5.02 4.22 - 5.81 MIL/uL   Hemoglobin 14.8 13.0 - 17.0 g/dL   HCT 44.0 39.0 - 52.0 %   MCV 87.6 80.0 - 100.0 fL   MCH 29.5 26.0 - 34.0 pg   MCHC 33.6 30.0 - 36.0 g/dL   RDW 15.3 11.5 - 15.5 %   Platelets 190 150 - 400 K/uL   nRBC 0.0 0.0 - 0.2 %   Neutrophils Relative % 66 %   Neutro Abs 5.8 1.7 - 7.7 K/uL   Lymphocytes Relative 21 %   Lymphs Abs 1.8 0.7 - 4.0 K/uL   Monocytes Relative 11 %   Monocytes Absolute 1.0 0.1 - 1.0 K/uL   Eosinophils Relative 1 %   Eosinophils Absolute 0.0 0.0 - 0.5 K/uL   Basophils Relative 1 %   Basophils Absolute 0.0 0.0 - 0.1 K/uL   Immature Granulocytes 0 %   Abs Immature Granulocytes 0.03 0.00 - 0.07 K/uL    Comment: Performed at [facility], [place] [address] [place], [place] [address]  Basic metabolic panel     Status: Abnormal   Collection Time: 06/10/22  1:25 AM  Result  Value Ref Range   Sodium 131 (L) 135 - 145 mmol/L   Potassium 3.5 3.5 - 5.1 mmol/L   Chloride 91 (L) 98 - 111 mmol/L   CO2 26 22 - 32 mmol/L   Glucose, Bld 118 (H) 70 - 99 mg/dL    Comment: Glucose reference range applies only to samples taken after fasting for at least 8 hours.   BUN 10 6 - 20 mg/dL   Creatinine, Ser 0.74 0.61 - 1.24 mg/dL   Calcium 9.3 8.9 - 10.3 mg/dL   GFR, Estimated >60 >60 mL/min    Comment: (NOTE) Calculated using the CKD-EPI Creatinine Equation (2021)    Anion gap 14 5 - 15    Comment: Performed at [facility], [place] [address] [place],  [address]  Blood gas, venous (at [facility] and [facility], not at [facility]     Status: Abnormal   Collection Time: 06/10/22  1:32 AM  Result Value Ref Range   pH, Ven 7.35 7.25 - 7.43   pCO2, Ven 53 44 - 60 mmHg   pO2, Ven 53 (H) 32 - 45 mmHg   Bicarbonate 29.3 (H) 20.0 -  28.0 mmol/L   Acid-Base Excess 2.5 (H) 0.0 - 2.0 mmol/L   O2 Saturation 81.8 %   Patient temperature 37.0     Comment: Performed at [facility]: [place], [place] [address] [place], [place] [address]  Resp Panel by RT-PCR (Flu A&B, Covid) Anterior Nasal Swab     Status: None   Collection Time: 06/10/22  1:36 AM   Specimen: Anterior Nasal Swab  Result Value Ref Range   SARS Coronavirus 2 by RT PCR NEGATIVE NEGATIVE    Comment: (NOTE) SARS-CoV-2 target nucleic acids are NOT DETECTED.  The SARS-CoV-2 RNA is generally detectable in upper respiratory specimens during the acute phase of infection. The lowest concentration of SARS-CoV-2 viral copies this assay can detect is 138 copies/mL. A negative result does not preclude SARS-Cov-2 infection and should not be used as the sole basis for treatment or other patient management decisions. A negative result may occur with  improper specimen collection/handling, submission of specimen other than nasopharyngeal swab, presence of viral mutation(s) within the areas targeted by this assay, and  inadequate number of viral copies(<138 copies/mL). A negative result must be combined with clinical observations, patient history, and epidemiological information. The expected result is Negative.  Fact Sheet for Patients:  EntrepreneurPulse.com.au  Fact Sheet for Healthcare Providers:  IncredibleEmployment.be  This test is no t yet approved or cleared by the [place] FDA and  has been authorized for detection and/or diagnosis of SARS-CoV-2 by FDA under an Emergency Use Authorization (EUA). This EUA will remain  in effect (meaning this test can be used) for the duration of the COVID-19 declaration under Section 564(b)(1) of the Act, 21 U.S.C.section 360bbb-3(b)(1), unless the authorization is terminated  or revoked sooner.       Influenza A by PCR NEGATIVE NEGATIVE   Influenza B by PCR NEGATIVE NEGATIVE    Comment: (NOTE) The Xpert Xpress SARS-CoV-2/FLU/RSV plus assay is intended as an aid in the diagnosis of influenza from Nasopharyngeal swab specimens and should not be used as a sole basis for treatment. Nasal washings and aspirates are unacceptable for Xpert Xpress SARS-CoV-2/FLU/RSV testing.  Fact Sheet for Patients: EntrepreneurPulse.com.au  Fact Sheet for Healthcare Providers: IncredibleEmployment.be  This test is not yet approved or cleared by the [place] FDA and has been authorized for detection and/or diagnosis of SARS-CoV-2 by FDA under an Emergency Use Authorization (EUA). This EUA will remain in effect (meaning this test can be used) for the duration of the COVID-19 declaration under Section 564(b)(1) of the Act, 21 U.S.C. section 360bbb-3(b)(1), unless the authorization is terminated or revoked.  Performed at [facility], [place] [address] [place], [place] [address]   MRSA Next Gen by PCR, Nasal     Status: None   Collection Time: 06/10/22  5:08 AM    Specimen: Nasal Mucosa; Nasal Swab  Result Value Ref Range   MRSA by PCR Next Gen NOT DETECTED NOT DETECTED    Comment: (NOTE) The GeneXpert MRSA Assay (FDA approved for NASAL specimens only), is one component of a comprehensive MRSA colonization surveillance program. It is not intended to diagnose MRSA infection nor to guide or monitor treatment for MRSA infections. Test performance is not FDA approved in patients less than 43 years old. Performed at [facility], [place] [address], [place], [place] [address]   HIV Antibody (routine testing w rflx)     Status: None   Collection Time: 06/10/22  9:58 AM  Result Value Ref Range  HIV Screen 4th Generation wRfx Non Reactive Non Reactive    Comment: Performed at [facility], [place] [address], [place], [place] [address]    CT SOFT TISSUE NECK W CONTRAST  Result Date: 06/10/2022 CLINICAL DATA:  Epiglottitis or tonsillitis. Follow-up previous abnormality right vocal cord. EXAM: CT NECK WITH CONTRAST TECHNIQUE: Multidetector CT imaging of the neck was performed using the standard protocol following the bolus administration of intravenous contrast. RADIATION DOSE REDUCTION: This exam was performed according to the departmental dose-optimization program which includes automated exposure control, adjustment of the mA and/or kV according to patient size and/or use of iterative reconstruction technique. CONTRAST:  69m OMNIPAQUE IOHEXOL 300 MG/ML  SOLN COMPARISON:  CT neck 01/06/2022 FINDINGS: Pharynx and larynx: Image quality degraded by extensive motion. 2 attempts were made to scan the neck both degraded by motion. Tonsils are symmetric.  No mass or abscess.  Epiglottis is normal. There is asymmetric thickening of the right aryepiglottic fold which has progressed in the interval. There is asymmetric enlargement of the right laryngeal ventricle and piriform sinus, with progression from the prior study. No airway compromise Salivary  glands: No inflammation, mass, or stone. Thyroid: Negative Lymph nodes: No enlarged lymph nodes in the neck. Vascular: Normal vascular enhancement. Mild atherosclerotic disease in the carotid artery bilaterally. Limited intracranial: Not imaged Visualized orbits: Negative Mastoids and visualized paranasal sinuses: Mild mucosal edema right and left maxillary sinus. Remaining sinuses clear. Mastoid clear. Skeleton: No acute abnormality Upper chest: Lung apices clear bilaterally Other: None IMPRESSION: 1. Image quality degraded by extensive motion. 2. Asymmetric thickening of the right aryepiglottic fold and asymmetric enlargement of the right laryngeal ventricle and piriform sinus. This has progressed in the interval. 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 Recommend laryngoscopy. 3. No tonsillar abscess. No adenopathy in the neck. 4. Mild atherosclerotic disease in the carotid artery bilaterally. Electronically Signed   By: CFranchot GalloM.D.   On: 06/10/2022 13:38   DG Chest Port 1 View  Result Date: 06/10/2022 CLINICAL DATA:  Dyspnea EXAM: PORTABLE CHEST 1 VIEW COMPARISON:  01/06/2022 FINDINGS: Left basilar opacity likely represents a confluence of vascular and osseous shadows. The lungs are otherwise clear. No pneumothorax or pleural effusion. Cardiac size within normal limits. Pulmonary vascularity is normal. No acute bone abnormality. Healed bilateral rib fractures noted. IMPRESSION: 1. No active disease. Electronically Signed   By: AFidela SalisburyM.D.   On: 06/10/2022 01:45    ROS Blood pressure (!) 176/90, pulse (!) 135, temperature 98.7 F (37.1 C), temperature source Axillary, resp. rate 18, height '5\' 6"'$  (1.676 m), weight 67.9 kg, SpO2 100 %. Physical Exam HENT:     Nose: Nose normal.     Mouth/Throat:     Mouth: Mucous membranes are moist.     Comments: The fibroptic exam reveals normal NP. The epiglottis and vallecula are normal the right aryepiglottic fold  on the right has swelling to the arytenoid. There is fullness in the right false cord with some exophytic tissue on the anterior glottis. The vocal cord does not appear to move. He has some stridor intermittant as I watched airway but he was very sensitive and gagging and coughing with no increase in breathing issues or stridor.  Eyes:     Pupils: Pupils are equal, round, and reactive to light.  Musculoskeletal:     Cervical back: Normal range of motion.  Lymphadenopathy:     Cervical: No cervical adenopathy.  Neurological:  Mental Status: He is alert.       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 there is evidence of mass on CT scan in  the right larynx but neck is without significant issue. 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 His airway is narrowed. I could not approach the glottis closely because of gagging and cough to better assess the tumor or subglottis. He will need a biopsy and that will require a trach for airway protection. He will need to be transferred to [facility] for any intervention.   Melissa Montane 06/10/2022, 3:09 PM

## 2022-06-10 NOTE — Progress Notes (Signed)
  Progress Note   Patient: Daniel Marshall OHY:[id number] DOB: November 23, 1975 DOA: 06/10/2022     0 DOS: the patient was seen and examined on 06/10/2022   Brief hospital course: 46 y.o. male with medical history significant of HTN, recent diagnosis of emphysema on CT scan earlier this year with 2 COPD exacerbations already this year.   Pt presented to ED with respiratory distress  Assessment and Plan: SOB secondary to R laryngeal mass -increased stridor and work of breathing this AM, not much improved despite neb tx -Improvement noted with addition of bipap this AM -Appreciate assistance by PCCM. CT neck reviewed. Finding of asymmetric thickening of R aryepiglottic fold and asymmetric enlargement of  R laryngeal ventricle and piriform sinus, suspicious ofr paresis of R recurrent laryngeal nerve -Seen by ENT, recs for transfer to [facility] under PCCM service for biopsy and trach for airway protection  * COPD exacerbation ruled out ([place]) Pt with known emphysema, prior smoking Continued empiric solumedrol BID Continued bronchodilators empirically   Acute respiratory failure with hypoxia ([place]) Due concerns of laryngeal mass  Improved with bipap Recs to transfer to [facility] where pt is planned to have trach placed for airway protection   Smoking -Nicotine patch ordered -Cessation done at time of presentation.      Subjective: Difficult to assess this AM given acute illness, pt not very communicative  Physical Exam: Vitals:   06/10/22 1113 06/10/22 1200 06/10/22 1334 06/10/22 1500  BP: (!) 153/98 (!) 176/90  (!) 143/93  Pulse: (!) 127 (!) 135    Resp: 16 18    Temp:   98.7 F (37.1 C) 98.7 F (37.1 C)  TempSrc:   Axillary Axillary  SpO2: 100% 100%    Weight:      Height:       General exam: Awake, laying in bed, appears sob Respiratory system: increased resp effort, stridor Cardiovascular system: regular rate, s1, s2 Gastrointestinal system: Soft, nondistended, positive BS Central  nervous system: CN2-12 grossly intact, strength intact Extremities: Perfused, no clubbing Skin: Normal skin turgor, no notable skin lesions seen Psychiatry: difficult to assess given acute illness  Data Reviewed:  Labs reviewed: Na 131, K 3.5, Cr 0.74, Hgb 14.8  Family Communication: Pt in room, family not at bedside  Disposition: Status is: Observation The patient remains OBS appropriate and will d/c before 2 midnights.  Planned Discharge Destination: Home     Author: Marylu Lund, MD 06/10/2022 3:55 PM  For on call review [email or web address].

## 2022-06-10 NOTE — Consult Note (Signed)
NAME:  Daniel Marshall, MRN:  [id number], DOB:  06/06/1976, LOS: 0 ADMISSION DATE:  06/10/2022, CONSULTATION DATE:  06/10/2022 REFERRING MD:  Dr. Wyline Copas, CHIEF COMPLAINT:  SOB   History of Present Illness:  Limited HPI as patient is on BiPAP.   46 year old male with prior history of current tobacco use~1ppd, childhood asthma, and HTN who presented to [facility] ER 11/15 by EMS with complaints of worsening shortness of breath.    Patient's girlfriend at bedside reports progressive shortness of breath over the last 3 days with non productive cough.  Denies any vaping hx, denies THC use.   Ran out of albuterol inhaler but states he is non compliant with prior inhalers and continues to smoke.  Denies any recent sick exposures, sore throat, fever or chills but did become diaphoretic last night.  Denies any feeling of tongue swelling or throat tightness.  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  With EMS, 95% on room air, diminished throughout and given duoneb and solumedrol.     Arrived in ER with continued wheezing and poor air movement and initially placed on BiPAP in ER but was able to be transitioned off to [place].  Reported inspiratory and expiratory wheezing.  Afebrile, hypertensive, with VBG/ mixed  7.35/ 53/53.  Labs noted for normal CBC, Na 131, K 3.5, Cl 91, sCr 0.74, neg for Flu/ SARS.  CXR without acute process.  Admitted to [facility].  PCCM consulted given worsening respiratory distress and needing BiPAP again.    Of note, patient with recent diagnosis of emphysema based on prior CT's with reported two COPD exacerbations this year> which included two ER visits (January and June) and discharged home.  Of note, on prior soft tissue CT 6/ 2023, noted abnormality on his right vocal cord with mild motion artifact at the larynx with non-specific asymmetry of right laryngeal ventricle with no discrete laryngeal/ pharyngeal mass or hyperenhancement  to consider  possible vocal cord paralysis.  Patient admitted at that time to have vocal changes over several weeks.  Was referred to ENT but does not appear to have followed up.    Pertinent  Medical History  Tobacco abuse, childhood asthma, HTN  Significant Hospital Events: Including procedures, antibiotic start and stop dates in addition to other pertinent events   11/15 admitted with suspected AECOPD  Interim History / Subjective:  Just placed back on BiPAP 20/8 with TVs 700-1L, 30% FiO2 and given ativan.  Getting cont albuterol neb  Objective   Blood pressure (!) 201/171, pulse (!) 129, temperature 97.6 F (36.4 C), temperature source Oral, resp. rate (!) 27, height '5\' 6"'$  (1.676 m), weight 67.9 kg, SpO2 100 %.    FiO2 (%):  [40 %] 40 %  No intake or output data in the 24 hours ending 06/10/22 0903 Filed Weights   06/10/22 0500  Weight: 67.9 kg   Examination: General:  ill appearing adult male sitting upright in bed in moderate respiratory distress on BiPAP HEENT: full face mask in place Neuro:  Awake, f/c, MAE CV: rr, ST, no murmur PULM:  increased WOB on BiPAP but starting to improve with some neck retractions, tachypneic in low 20's, loud inspiratory stridor with transmitted sounds in chest, diminished throughout  GI: +bs, soft, NT Extremities: warm/dry, no LE edema  Skin: no rashes   Resolved Hospital Problem list    Assessment & Plan:   Acute respiratory distress > ddx include  possible VCD/ pathology vs viral process vs CAP vs possible asthma exacerbation Hx childhood asthma - mild emphysematous changes noted on prior CT Plan:  - cont BiPAP for now till breathing improves and then possible trial off - check ABG if worsening mental status - O2 prn for sat goal 92-99% - at risk for intubation  - continue solumedrol '60mg'$  q 12hrs - add brovanna, yupelri, albuterol prn, and duonebs q4 - suspect VCD or pathology contributing to current distress given exam of insp stridor and prior  soft tissue CT concerning for right vocal cord asymmetry.  No mass seen at that time on imaging.  Consider repeat soft tissue CT and consult ENT when respiratory status is more stable.  - cont ativan 0.'5mg'$  q 4prn w/ BiPAP> avoid oversedation  - cont empiric ceftriaxone/ azithro for now but suspect we can stop soon given he is afebrile, normal CBC with differential, and clear CXR.  Check PCT.  - intermittent CXR - pending RVP - will need f/u in our clinic for formal testing, PFTs, etc.   Tobacco abuse - nicotine patch - smoking cessation when appropriate   Remainder per [facility] PCCM will continue to follow  Best Practice (right click and "Reselect all SmartList Selections" daily)   Diet/type: NPO DVT prophylaxis: LMWH GI prophylaxis: N/A Lines: N/A Foley:  N/A Code Status:  full code Last date of multidisciplinary goals of care discussion [per [facility]]  Labs   CBC: Recent Labs  Lab 06/10/22 0125  WBC 8.7  NEUTROABS 5.8  HGB 14.8  HCT 44.0  MCV 87.6  PLT 638    Basic Metabolic Panel: Recent Labs  Lab 06/10/22 0125  NA 131*  K 3.5  CL 91*  CO2 26  GLUCOSE 118*  BUN 10  CREATININE 0.74  CALCIUM 9.3   GFR: Estimated Creatinine Clearance: 103 mL/min (by C-G formula based on SCr of 0.74 mg/dL). Recent Labs  Lab 06/10/22 0125  WBC 8.7    Liver Function Tests: No results for input(s): "AST", "ALT", "ALKPHOS", "BILITOT", "PROT", "ALBUMIN" in the last 168 hours. No results for input(s): "LIPASE", "AMYLASE" in the last 168 hours. No results for input(s): "AMMONIA" in the last 168 hours.  ABG    Component Value Date/Time   HCO3 29.3 (H) 06/10/2022 0132   TCO2 25 03/07/2018 2005   O2SAT 81.8 06/10/2022 0132     Coagulation Profile: No results for input(s): "INR", "PROTIME" in the last 168 hours.  Cardiac Enzymes: No results for input(s): "CKTOTAL", "CKMB", "CKMBINDEX", "TROPONINI" in the last 168 hours.  HbA1C: Hgb A1c MFr Bld  Date/Time Value Ref Range Status   05/05/2017 02:45 PM 5.0 <5.7 % of total Hgb Final    Comment:    For the purpose of screening for the presence of diabetes: . <5.7%       Consistent with the absence of diabetes 5.7-6.4%    Consistent with increased risk for diabetes             (prediabetes) > or =6.5%  Consistent with diabetes . This assay result is consistent with a decreased risk of diabetes. . Currently, no consensus exists regarding use of hemoglobin A1c for diagnosis of diabetes in children. . According to American Diabetes Association (ADA) guidelines, hemoglobin A1c <7.0% represents optimal control in non-pregnant diabetic patients. Different metrics may apply to specific patient populations.  Standards of Medical Care in Diabetes(ADA). .     CBG: No results for input(s): "GLUCAP" in the last 168 hours.  Review of Systems:   Limited as patient is on BIPAP Review of Systems  Constitutional:  Negative for chills and fever.  HENT:  Negative for sore throat.   Respiratory:  Positive for cough, shortness of breath and wheezing. Negative for sputum production.   Cardiovascular:  Negative for chest pain and leg swelling.  Gastrointestinal:  Negative for abdominal pain, nausea and vomiting.  Neurological:  Negative for focal weakness.   Past Medical History:  He,  has a past medical history of Asthma, Broken ribs, and Hypertension.   Surgical History:  No past surgical history on file.   Social History:   reports that he has been smoking cigarettes. He has never used smokeless tobacco. He reports current alcohol use. He reports current drug use. Drug: Marijuana.   Family History:  His Family history is unknown by patient.   Allergies No Known Allergies   Home Medications  Prior to Admission medications   Medication Sig Start Date End Date Taking? Authorizing Provider  acetaminophen (TYLENOL 8 HOUR) 650 MG CR tablet Take 1 tablet (650 mg total) by mouth every 8 (eight) hours as needed for  pain. 08/01/20   Suzy Bouchard, PA-C  albuterol (PROVENTIL HFA;VENTOLIN HFA) 108 (90 Base) MCG/ACT inhaler Inhale 2 puffs into the lungs every 4 (four) hours as needed for wheezing or shortness of breath (cough, shortness of breath or wheezing.). 10/14/18   Azzie Glatter, FNP     Critical care time: 85 mins     Kennieth Rad, MSN, AG-ACNP-BC [facility] 06/10/2022, 9:03 AM  See Amion for pager If no response to pager, please call PCCM consult pager After 7:00 pm call Elink

## 2022-06-10 NOTE — ED Provider Notes (Signed)
[facility] Emergency Department Provider Note MRN:  [id number]  Arrival date & time: 06/10/22     Chief Complaint   Respiratory Distress  History of Present Illness   Daniel Marshall is a 46 y.o. year-old male presents to the ED with chief complaint of respiratory distress.  Has history of COPD.  States that he does not for the past couple of nights he has been having significant shortness of breath tightness with breathing.  He states that he thinks he exacerbated his symptoms yesterday while at work.  Reports recent diagnosis of emphysema.  Denies fevers, chills.  Was brought in by EMS, who gave patient 2 DuoNeb's as well as 125 mg IV Solu-Medrol.  History provided by patient.   Review of Systems  Pertinent positive and negative review of systems noted in HPI.    Physical Exam   Vitals:   06/10/22 0147 06/10/22 0248  BP:  (!) 193/103  Pulse:  (!) 125  Resp:  (!) 21  Temp:    SpO2: 98% 99%   T: 97.7 BP 154/88 HR 107 RR 20 O2sat 98% on bipap CONSTITUTIONAL:  moderate respiratory distress, diaphoretic NEURO:  Alert and oriented x 3, CN 3-12 grossly intact EYES:  eyes equal and reactive ENT/NECK:  Supple, no stridor  CARDIO:  normal rate, regular rhythm, appears well-perfused  PULM: moderate respiratory distress, absent/diminished lung sounds GI/GU:  non-distended, non tendere MSK/SPINE:  No gross deformities, no edema, moves all extremities  SKIN:  no rash, atraumatic   *Additional and/or pertinent findings included in MDM below  Diagnostic and Interventional Summary    Labs Reviewed  BLOOD GAS, VENOUS - Abnormal; Notable for the following components:      Result Value   pO2, Ven 53 (*)    Bicarbonate 29.3 (*)    Acid-Base Excess 2.5 (*)    All other components within normal limits  BASIC METABOLIC PANEL - Abnormal; Notable for the following components:   Sodium 131 (*)    Chloride 91 (*)    Glucose, Bld 118 (*)    All other components  within normal limits  RESP PANEL BY RT-PCR (FLU A&B, COVID) ARPGX2  CBC WITH DIFFERENTIAL/PLATELET  ALPHA-1-ANTITRYPSIN PHENOTYP    DG Chest Port 1 View  Final Result      Medications  magnesium sulfate IVPB 2 g 50 mL (0 g Intravenous Stopped 06/10/22 0238)  albuterol (PROVENTIL) (2.5 MG/3ML) 0.083% nebulizer solution (10 mg/hr Nebulization Given 06/10/22 0147)  ipratropium (ATROVENT) nebulizer solution 0.5 mg (0.5 mg Nebulization Given 06/10/22 0147)  LORazepam (ATIVAN) injection 0.5 mg (0.5 mg Intravenous Given 06/10/22 0229)     Procedures  /  Critical Care .Critical Care  Performed by: Montine Circle, PA-C Authorized by: Montine Circle, PA-C   Critical care provider statement:    Critical care time (minutes):  50   Critical care was necessary to treat or prevent imminent or life-threatening deterioration of the following conditions:  Respiratory failure   Critical care was time spent personally by me on the following activities:  Development of treatment plan with patient or surrogate, discussions with consultants, evaluation of patient's response to treatment, examination of patient, ordering and review of laboratory studies, ordering and review of radiographic studies, ordering and performing treatments and interventions, pulse oximetry, re-evaluation of patient's condition and review of old charts   ED Course and Medical Decision Making  I have reviewed the triage vital signs, the nursing notes, and pertinent available records from the  EMR.  Social Determinants Affecting Complexity of Care: Patient has no clinically significant social determinants affecting this chief complaint..   ED Course:    Medical Decision Making Patient here with COPD exacerbation.  Recent dx of emphysema.  Symptoms are worse at night, but worsened significantly tonight. Denies fever or productive cough.  Lung sounds are basically absent.  Will start on Bipap and continue with nebs.  Already  received 125 mg Solu-Medrol.  Will give 2 g magnesium.  Patient reassessed, improving on BiPAP.  Now able to speak in sentences.  No longer having accessory muscle use.  Moving air better.  Amount and/or Complexity of Data Reviewed Labs: ordered. Radiology: ordered.  Risk Prescription drug management. Decision regarding hospitalization.     Consultants: I discussed the case with Hospitalist, Dr. Alcario Drought, who is appreciated for admitting.   Treatment and Plan: Patient's exam and diagnostic results are concerning for COPD exacerbation.  Feel that patient will need admission to the hospital for further treatment and evaluation.  Patient seen by and discussed with attending physician, Dr. Ralene Bathe, who agrees with plan.  Final Clinical Impressions(s) / ED Diagnoses     ICD-10-CM   1. COPD exacerbation [facility])  J44.1       ED Discharge Orders     None         Discharge Instructions Discussed with and Provided to Patient:   Discharge Instructions   None      Montine Circle, PA-C 06/10/22 0252    Quintella Reichert, MD 06/10/22 [phone]

## 2022-06-10 NOTE — Progress Notes (Signed)
Initial Nutrition Assessment  DOCUMENTATION CODES:  Non-severe (moderate) malnutrition in context of chronic illness  INTERVENTION:  -When diet may be advanced, recommend regular to optimize po intake -When diet is advanced, recommend Ensure Plus HP BID to provide 350kcal and 20g protein/bottle  NUTRITION DIAGNOSIS:  Moderate Malnutrition related to chronic illness as evidenced by moderate fat depletion, moderate muscle depletion, percent weight loss.  GOAL:  Patient will meet greater than or equal to 90% of their needs  MONITOR:  PO intake, Supplement acceptance, Diet advancement  REASON FOR ASSESSMENT:  Consult Assessment of nutrition requirement/status  ASSESSMENT:  Pt is a 46yo M with PMH of HTN, recently diagnosed emphysema, COPD, and asthma who presents with respiratory distress, wheezing and poor air movement.   Visited pt at bedside this afternoon. He was weaned to [facility] from [place] per report. Pt able to answer questions. He reports normal appetite and po intake PTA. He is unsure of recent weight loss. Chart review shows a significant 19% unintended weight loss in the last 5 months. NFPE reveals mild to moderate fat loss and mild to moderate muscle wasting. Pt meets ASPEN criteria for moderate protein calorie malnutrition r/t chronic illness.   Weight History: 06/10/22 67.9 kg  01/06/22 83.9 kg  08/01/20 75.3 kg  10/14/18 83.1 kg  06/03/18 83.5 kg  *significant 19% unintended weight loss in 5 months  Discussed with pt that when it's difficult to breath your body is working harder and burning more calories. Therefore we need to increase the calories and protein he consumes. Pt agreeable to Ensure or Boost BID when diet is advanced. Pt remains NPO at this time due to possible need for intubation and requiring intermittent bipap. When diet is advanced, recommend regular to optimize po intake.  Medications reviewed and include: solu-medrol, rocephin,   Labs reviewed: Na:131,  BG:118 (likely elevated due to steroids)   NUTRITION - FOCUSED PHYSICAL EXAM:  Flowsheet Row Most Recent Value  Orbital Region Mild depletion  Upper Arm Region Moderate depletion  Thoracic and Lumbar Region Moderate depletion  Buccal Region Mild depletion  Temple Region Moderate depletion  Clavicle Bone Region Moderate depletion  Clavicle and Acromion Bone Region Mild depletion  Scapular Bone Region Unable to assess  Dorsal Hand No depletion  Patellar Region Mild depletion  Anterior Thigh Region No depletion  Posterior Calf Region No depletion  Hair Reviewed  Eyes Reviewed  Mouth Reviewed  Skin Reviewed  Nails Reviewed       Diet Order:   Diet Order             Diet NPO time specified  Diet effective now                   EDUCATION NEEDS:   Education needs have been addressed  Skin:  Skin Assessment: Reviewed RN Assessment  Last BM:  unknown, PTA  Height:  Ht Readings from Last 1 Encounters:  06/10/22 '5\' 6"'$  (1.676 m)   Weight:  Wt Readings from Last 1 Encounters:  06/10/22 67.9 kg    BMI:  Body mass index is 24.16 kg/m.  Estimated Nutritional Needs:  Kcal:  [id number] Protein:  80-100g Fluid:  >2L  Candise Bowens, MS, RD, LDN, CNSC See AMiON for contact information

## 2022-06-10 NOTE — Progress Notes (Signed)
Pt had unwitnessed fall. Found down by ENT PA. Bed alarm in place. Pt RN Bubba Hales and MD Wyline Copas made aware. Post fall huddle complete.

## 2022-06-10 NOTE — H&P (Signed)
History and Physical    Patient: Daniel Marshall ASN:[id number] DOB: 01-Dec-1975 DOA: 06/10/2022 DOS: the patient was seen and examined on 06/10/2022 PCP: Pcp, No  Patient coming from: Home  Chief Complaint:  Chief Complaint  Patient presents with   Respiratory Distress    Pt arrived from home for respiratory distress and chest tightness. Pt has been diagnosed with emphysema x1 week ago. Pt reports difficulty breathing and "I feel like I am going to pass out. I can barely make it" Pt was BIB by EMS on NRB, O2 sats at 95.    HPI: GRANT SWAGER is a 46 y.o. male with medical history significant of HTN, recent diagnosis of emphysema on CT scan earlier this year with 2 COPD exacerbations already this year.  Pt in to ED with respiratory distress.  Wheezing and poor air movement.  Severe SOB and poor air movement for past couple of nights.  No fever, chills.  EMS gave 2 duonebs and 125 solu medrol  Pt on rescue BIPAP.  Review of Systems: As mentioned in the history of present illness. All other systems reviewed and are negative. Past Medical History:  Diagnosis Date   Asthma    as a child   Broken ribs    Hypertension    No past surgical history on file. Social History:  reports that he has been smoking cigarettes. He has never used smokeless tobacco. He reports current alcohol use. He reports current drug use. Drug: Marijuana.  No Known Allergies  Family History  Family history unknown: Yes    Prior to Admission medications   Medication Sig Start Date End Date Taking? Authorizing Provider  acetaminophen (TYLENOL 8 HOUR) 650 MG CR tablet Take 1 tablet (650 mg total) by mouth every 8 (eight) hours as needed for pain. 08/01/20   Suzy Bouchard, PA-C  albuterol (PROVENTIL HFA;VENTOLIN HFA) 108 (90 Base) MCG/ACT inhaler Inhale 2 puffs into the lungs every 4 (four) hours as needed for wheezing or shortness of breath (cough, shortness of breath or wheezing.). 10/14/18   Azzie Glatter, FNP  benzonatate (TESSALON) 100 MG capsule Take 1 capsule (100 mg total) by mouth every 8 (eight) hours. 08/01/20   Suzy Bouchard, PA-C  Ciclopirox 0.77 % gel Apply to both feet and between toes bid for 4 weeks. 12/28/18   Marzetta Board, DPM  cyclobenzaprine (FLEXERIL) 10 MG tablet Take 1 tablet (10 mg total) by mouth 2 (two) times daily as needed for muscle spasms. 10/14/18   Azzie Glatter, FNP  lidocaine (LIDODERM) 5 % Place 1 patch onto the skin daily. Remove & Discard patch within 12 hours or as directed by MD 03/07/18   Volanda Napoleon, PA-C  methocarbamol (ROBAXIN) 500 MG tablet Take 1 tablet (500 mg total) by mouth 2 (two) times daily. 03/07/18   Volanda Napoleon, PA-C  ondansetron (ZOFRAN ODT) 4 MG disintegrating tablet Take 1 tablet (4 mg total) by mouth every 8 (eight) hours as needed for nausea or vomiting. 08/01/20   Suzy Bouchard, PA-C    Physical Exam: Vitals:   06/10/22 0141 06/10/22 0147 06/10/22 0248 06/10/22 0342  BP:   (!) 193/103 (!) 141/85  Pulse:   (!) 125 (!) 112  Resp:   (!) 21 (!) 21  Temp:      TempSrc:      SpO2: 98% 98% 99% 99%   Constitutional: Requiring rescue BIPAP Eyes: PERRL, lids and conjunctivae normal ENMT: Mucous membranes  are moist. Posterior pharynx clear of any exudate or lesions.Normal dentition.  Neck: normal, supple, no masses, no thyromegaly Respiratory: diffuse wheezing, no accessory muscle use at this time but on BIPAP Cardiovascular: Regular rate and rhythm, no murmurs / rubs / gallops. No extremity edema. 2+ pedal pulses. No carotid bruits.  Abdomen: no tenderness, no masses palpated. No hepatosplenomegaly. Bowel sounds positive.  Musculoskeletal: no clubbing / cyanosis. No joint deformity upper and lower extremities. Good ROM, no contractures. Normal muscle tone.  Skin: no rashes, lesions, ulcers. No induration Neurologic: CN 2-12 grossly intact. Sensation intact, DTR normal. Strength 5/5 in all 4.  Psychiatric: Normal  judgment and insight. Alert and oriented x 3. Normal mood.   Data Reviewed:       Latest Ref Rng & Units 06/10/2022    1:25 AM 01/06/2022    8:01 AM 03/07/2018    8:05 PM  CBC  WBC 4.0 - 10.5 K/uL 8.7  5.5    Hemoglobin 13.0 - 17.0 g/dL 14.8  13.3  15.6   Hematocrit 39.0 - 52.0 % 44.0  38.3  46.0   Platelets 150 - 400 K/uL 190  153         Latest Ref Rng & Units 06/10/2022    1:25 AM 01/06/2022    8:01 AM 03/07/2018    8:05 PM  CMP  Glucose 70 - 99 mg/dL 118  92  98   BUN 6 - 20 mg/dL '10  5  10   '$ Creatinine 0.61 - 1.24 mg/dL 0.74  0.61  1.00   Sodium 135 - 145 mmol/L 131  137  137   Potassium 3.5 - 5.1 mmol/L 3.5  4.3  4.3   Chloride 98 - 111 mmol/L 91  99  102   CO2 22 - 32 mmol/L 26  30    Calcium 8.9 - 10.3 mg/dL 9.3  8.2    Total Protein 6.5 - 8.1 g/dL  8.1    Total Bilirubin 0.3 - 1.2 mg/dL  0.9    Alkaline Phos 38 - 126 U/L  119    AST 15 - 41 U/L  98    ALT 0 - 44 U/L  44     COVID and flu neg CXR neg  Assessment and Plan: * COPD exacerbation (HCC) Pt with known emphysema, prior smoking COPD pathway Check Alpha 1 AT Solumedrol BID PRN albuterol Scheduled LABA, LAMA, and INH steroid Rocephin Tele monitor and cont pulse ox  Acute respiratory failure with hypoxia (HCC) Due to COPD exacerbation Requiring rescue BIPAP still.  Smoking Nicotine patch ordered Discussed at length with patient: importance of quitting smoking.  Only 46 and has emphysema on CT scan and now admission for COPD.  Multiple COPD exacerbations this year.      Advance Care Planning:   Code Status: Full Code  Consults: None  Family Communication: No family in room  Severity of Illness: The appropriate patient status for this patient is OBSERVATION. Observation status is judged to be reasonable and necessary in order to provide the required intensity of service to ensure the patient's safety. The patient's presenting symptoms, physical exam findings, and initial radiographic and  laboratory data in the context of their medical condition is felt to place them at decreased risk for further clinical deterioration. Furthermore, it is anticipated that the patient will be medically stable for discharge from the hospital within 2 midnights of admission.   Author: Etta Quill., DO 06/10/2022 3:47 AM  For on  call review [email or web address].

## 2022-06-10 NOTE — Progress Notes (Signed)
Per ENT needs to be transferred to [facility] for throat biopsy and tracheostomy. Careline called to initiate transfer to [facility]. Will transfer to ICU service there.   Julian Hy, DO 06/10/22 3:16 PM [facility]

## 2022-06-10 NOTE — Assessment & Plan Note (Addendum)
Nicotine patch ordered Discussed at length with patient: importance of quitting smoking.  Only 46 and has emphysema on CT scan and now admission for COPD.  Multiple COPD exacerbations this year.

## 2022-06-10 NOTE — Progress Notes (Signed)
Pt transported from ED to ICU 1234 on bipap.  Pt tolerated trip well without incident.

## 2022-06-10 NOTE — Progress Notes (Signed)
This RN has attempted to call pt's significant other, Lablanche, twice to update her regarding plan of care, pending patient transfer to [facility], and patient fall. Unable to reach, voicemail box full.

## 2022-06-10 NOTE — Discharge Summary (Signed)
Physician Discharge Summary         Patient ID: Daniel Marshall MRN: [id number] DOB/AGE: 1975/12/28 46 y.o.  Admit date: 06/10/2022 Discharge date: 06/10/2022  Discharge Diagnoses:    Active Hospital Problems   Diagnosis Date Noted   COPD exacerbation ([place]) 06/10/2022   Acute respiratory failure with hypoxia (H[facility]) 06/10/2022   Smoking 06/10/2022   Malnutrition of moderate degree 06/10/2022    Resolved Hospital Problems  No resolved problems to display.      Discharge summary    Patient decided to leave against medical advice on 06/10/22.   46 year old male with prior history of current tobacco use~1ppd, childhood asthma, and HTN who presented to [facility] ER 11/15 by EMS with complaints of worsening shortness of breath.     Of note, patient diagnosed with COPD in 12/2021 after mild emphysematous changes noted on CT chest when he presented then with SOB, throat tightness, and hoarseness.  Was referred to ENT for laryngoscopy due to abnormal neck CT with concern for right vocal cord abnormality, but he was lost in follow up.    Patient with progressive shortness of breath over the last 3 days with a non productive cough.  Denies any vaping hx, denies THC use.  Ran out of albuterol inhaler at home. Denies any recent sick exposures, trouble swallowing, sore throat, fever or chills but did become diaphoretic last night.  Denies any feeling of tongue swelling or throat tightness.  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  With EMS, 95% on room air, diminished throughout and given duoneb and solumedrol.      Arrived in ER with continued wheezing and poor air movement and initially placed on BiPAP in ER but was able to be transitioned off to [place].  Reported inspiratory and expiratory wheezing.  Afebrile, hypertensive, with VBG/ mixed  7.35/ 53/53.  Labs noted for normal CBC, Na 131, K 3.5, Cl 91, sCr 0.74, neg for Flu/ SARS.  CXR without acute  process.  Admitted to [facility] and started on solumedrol, duonebs, and empiric ceftriaxone/ azithro.  Developed worsening SOB requiring BiPAP again.  PCCM consulted given worsening respiratory distress. Found to have significant inspiratory stridor improved with BiPAP and was able to be transitioned off.  CT soft tissues repeated which showed asymmetric thickening of the right aryepiglottic fold and asymmetric enlargement of the right laryngeal ventricle and piriform sinus, progressed since prior imaging.  ENT was consulted and underwent bedside fiberoptic exam which revealed right larynx mass with vocal cord paresis with narrowed airway but exam further limited given gagging and coughing.  It was strongly recommended patient be transferred to [facility] and undergo biopsy and tracheostomy by ENT, however patient adamant to leave AMA despite multiple conversations and advised risk of worsening respiratory distress, loss of airway, and including death.  Attempts to call and contact his girlfriend in which we have been taking with earlier in the day unsuccessful.  Patient reports his grandmother recently passed away and he needs to attend funeral in [place], [place] and get is work in order.  Patient reports he will go home and discuss with family and possibly return to ER in am.    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   Tobacco abuse -  cessation counseling  Emphysema by imagine - needs f/u in pulmonary office   HTN - not on home meds, f/u with PCP   Significant Hospital tests/ studies  11/15 CT soft tissue neck>  1. Image quality degraded by extensive motion. 2. Asymmetric thickening of the right aryepiglottic fold and asymmetric enlargement of the right laryngeal ventricle and piriform sinus. This has progressed in the interval.  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 Recommend laryngoscopy. 3. No tonsillar abscess. No adenopathy in the neck. 4. Mild atherosclerotic disease in the carotid artery bilaterally.  11/15 ENT bedside fiberoptic exam by Dr. Janace Hoard reveals normal NP. The epiglottis and vallecula are normal the right aryepiglottic fold on the right has swelling to the arytenoid. There is fullness in the right false cord with some exophytic tissue on the anterior glottis. The vocal cord does not appear to move. He has some stridor intermittant as I watched airway but he was very sensitive and gagging and coughing with no increase in breathing issues or stridor.   Procedures    Culture data/antimicrobials   11/15 SARS/ flu> neg 11/15 MRSA PCR> neg 11/15 RVP > neg  11/15 azithro/ ceftriaxone   Consults  PCCM ENT- Dr. Janace Hoard    Discharge Exam: BP (!) 143/93 (BP Location: Right Arm)   Pulse (!) 135   Temp 98.7 F (37.1 C) (Axillary)   Resp 18   Ht '5\' 6"'$  (1.676 m)   Wt 67.9 kg   SpO2 100%   BMI 24.16 kg/m   Unable   Labs at discharge   Lab Results  Component Value Date   CREATININE 0.74 06/10/2022   BUN 10 06/10/2022   NA 131 (L) 06/10/2022   K 3.5 06/10/2022   CL 91 (L) 06/10/2022   CO2 26 06/10/2022   Lab Results  Component Value Date   WBC 8.7 06/10/2022   HGB 14.8 06/10/2022   HCT 44.0 06/10/2022   MCV 87.6 06/10/2022   PLT 190 06/10/2022   Lab Results  Component Value Date   ALT 44 01/06/2022   AST 98 (H) 01/06/2022   ALKPHOS 119 01/06/2022   BILITOT 0.9 01/06/2022   No results found for: "INR", "PROTIME"  Current radiological studies    DG Wrist 2 Views Left  Result Date: 06/10/2022 CLINICAL DATA:  Fall pain EXAM: LEFT WRIST - 2 VIEW COMPARISON:  None Available. FINDINGS: There is no evidence of fracture or dislocation. There is no evidence of arthropathy or other focal bone abnormality. Soft tissues are unremarkable. IMPRESSION:  Negative. Electronically Signed   By: Donavan Foil M.D.   On: 06/10/2022 16:27   CT SOFT TISSUE NECK W CONTRAST  Result Date: 06/10/2022 CLINICAL DATA:  Epiglottitis or tonsillitis. Follow-up previous abnormality right vocal cord. EXAM: CT NECK WITH CONTRAST TECHNIQUE: Multidetector CT imaging of the neck was performed using the standard protocol following the bolus administration of intravenous contrast. RADIATION DOSE REDUCTION: This exam was performed according to the departmental dose-optimization program which includes automated exposure control, adjustment of the mA and/or kV according to patient size and/or use of iterative reconstruction technique. CONTRAST:  48m OMNIPAQUE IOHEXOL 300 MG/ML  SOLN COMPARISON:  CT neck 01/06/2022 FINDINGS: Pharynx and larynx: Image quality degraded by extensive motion. 2 attempts were made to scan the neck both degraded by motion. Tonsils are symmetric.  No mass or abscess.  Epiglottis is normal. There is asymmetric thickening of the right aryepiglottic fold which has progressed in the interval. There  is asymmetric enlargement of the right laryngeal ventricle and piriform sinus, with progression from the prior study. No airway compromise Salivary glands: No inflammation, mass, or stone. Thyroid: Negative Lymph nodes: No enlarged lymph nodes in the neck. Vascular: Normal vascular enhancement. Mild atherosclerotic disease in the carotid artery bilaterally. Limited intracranial: Not imaged Visualized orbits: Negative Mastoids and visualized paranasal sinuses: Mild mucosal edema right and left maxillary sinus. Remaining sinuses clear. Mastoid clear. Skeleton: No acute abnormality Upper chest: Lung apices clear bilaterally Other: None IMPRESSION: 1. Image quality degraded by extensive motion. 2. Asymmetric thickening of the right aryepiglottic fold and asymmetric enlargement of the right laryngeal ventricle and piriform sinus. This has progressed in the interval. Findings  suspicious for paresis of the right recurrent laryngeal nerve. Recommend laryngoscopy. 3. No tonsillar abscess. No adenopathy in the neck. 4. Mild atherosclerotic disease in the carotid artery bilaterally. Electronically Signed   By: Franchot Gallo M.D.   On: 06/10/2022 13:38   DG Chest Port 1 View  Result Date: 06/10/2022 CLINICAL DATA:  Dyspnea EXAM: PORTABLE CHEST 1 VIEW COMPARISON:  01/06/2022 FINDINGS: Left basilar opacity likely represents a confluence of vascular and osseous shadows. The lungs are otherwise clear. No pneumothorax or pleural effusion. Cardiac size within normal limits. Pulmonary vascularity is normal. No acute bone abnormality. Healed bilateral rib fractures noted. IMPRESSION: 1. No active disease. Electronically Signed   By: Fidela Salisbury M.D.   On: 06/10/2022 01:45    Disposition:   Left AMA   Follow-up appointment     Left AMA       Kennieth Rad, MSN, AG-ACNP-BC [facility] 06/10/2022, 5:02 PM  See Amion for pager If no response to pager, please call PCCM consult pager After 7:00 pm call Elink

## 2022-06-10 NOTE — Assessment & Plan Note (Signed)
Pt with known emphysema, prior smoking COPD pathway Check Alpha 1 AT Solumedrol BID PRN albuterol Scheduled LABA, LAMA, and INH steroid Rocephin Tele monitor and cont pulse ox

## 2022-06-10 NOTE — Progress Notes (Signed)
OT Cancellation Note  Patient Details Name: Daniel Marshall MRN: [id number] DOB: 1975-11-03   Cancelled Treatment:    Reason Eval/Treat Not Completed: Medical issues which prohibited therapy Patient is in RED MEWS with increased HR and BP in bed. OT to continue to follow and check back as schedule will allow [name], MS Acute Rehabilitation Department Office# [phone]  06/10/2022, 9:40 AM

## 2022-06-11 ENCOUNTER — Encounter (HOSPITAL_COMMUNITY): Payer: Self-pay | Admitting: Emergency Medicine

## 2022-06-11 ENCOUNTER — Inpatient Hospital Stay (HOSPITAL_COMMUNITY)
Admission: EM | Admit: 2022-06-11 | Discharge: 2022-06-19 | DRG: 012 | Disposition: A | Discharge status: Home Health | Payer: Self-pay | Attending: Internal Medicine | Admitting: Internal Medicine

## 2022-06-11 ENCOUNTER — Emergency Department (HOSPITAL_COMMUNITY): Payer: Medicaid Other

## 2022-06-11 ENCOUNTER — Other Ambulatory Visit: Payer: Self-pay

## 2022-06-11 DIAGNOSIS — J3800 Paralysis of vocal cords and larynx, unspecified: Secondary | ICD-10-CM | POA: Diagnosis present

## 2022-06-11 DIAGNOSIS — F17210 Nicotine dependence, cigarettes, uncomplicated: Secondary | ICD-10-CM | POA: Diagnosis present

## 2022-06-11 DIAGNOSIS — J449 Chronic obstructive pulmonary disease, unspecified: Secondary | ICD-10-CM | POA: Diagnosis present

## 2022-06-11 DIAGNOSIS — J432 Centrilobular emphysema: Secondary | ICD-10-CM | POA: Diagnosis present

## 2022-06-11 DIAGNOSIS — R221 Localized swelling, mass and lump, neck: Secondary | ICD-10-CM | POA: Diagnosis present

## 2022-06-11 DIAGNOSIS — C329 Malignant neoplasm of larynx, unspecified: Principal | ICD-10-CM | POA: Diagnosis present

## 2022-06-11 DIAGNOSIS — R0602 Shortness of breath: Secondary | ICD-10-CM | POA: Diagnosis present

## 2022-06-11 DIAGNOSIS — I10 Essential (primary) hypertension: Secondary | ICD-10-CM | POA: Diagnosis present

## 2022-06-11 DIAGNOSIS — J387 Other diseases of larynx: Principal | ICD-10-CM | POA: Diagnosis present

## 2022-06-11 DIAGNOSIS — R061 Stridor: Secondary | ICD-10-CM

## 2022-06-11 DIAGNOSIS — E871 Hypo-osmolality and hyponatremia: Secondary | ICD-10-CM | POA: Diagnosis present

## 2022-06-11 DIAGNOSIS — E876 Hypokalemia: Secondary | ICD-10-CM | POA: Diagnosis present

## 2022-06-11 HISTORY — DX: Dyspnea, unspecified: R06.00

## 2022-06-11 LAB — CBC WITH DIFFERENTIAL/PLATELET
Abs Immature Granulocytes: 0.05 10*3/uL (ref 0.00–0.07)
Basophils Absolute: 0 10*3/uL (ref 0.0–0.1)
Basophils Relative: 0 %
Eosinophils Absolute: 0 10*3/uL (ref 0.0–0.5)
Eosinophils Relative: 0 %
HCT: 41.8 % (ref 39.0–52.0)
Hemoglobin: 14.2 g/dL (ref 13.0–17.0)
Immature Granulocytes: 1 %
Lymphocytes Relative: 14 %
Lymphs Abs: 1.5 10*3/uL (ref 0.7–4.0)
MCH: 29.7 pg (ref 26.0–34.0)
MCHC: 34 g/dL (ref 30.0–36.0)
MCV: 87.4 fL (ref 80.0–100.0)
Monocytes Absolute: 0.9 10*3/uL (ref 0.1–1.0)
Monocytes Relative: 9 %
Neutro Abs: 8.1 10*3/uL — ABNORMAL HIGH (ref 1.7–7.7)
Neutrophils Relative %: 76 %
Platelets: 230 10*3/uL (ref 150–400)
RBC: 4.78 MIL/uL (ref 4.22–5.81)
RDW: 14.8 % (ref 11.5–15.5)
WBC: 10.6 10*3/uL — ABNORMAL HIGH (ref 4.0–10.5)
nRBC: 0 % (ref 0.0–0.2)

## 2022-06-11 LAB — COMPREHENSIVE METABOLIC PANEL
ALT: 31 U/L (ref 0–44)
AST: 67 U/L — ABNORMAL HIGH (ref 15–41)
Albumin: 3.5 g/dL (ref 3.5–5.0)
Alkaline Phosphatase: 74 U/L (ref 38–126)
Anion gap: 10 (ref 5–15)
BUN: 7 mg/dL (ref 6–20)
CO2: 27 mmol/L (ref 22–32)
Calcium: 8.7 mg/dL — ABNORMAL LOW (ref 8.9–10.3)
Chloride: 94 mmol/L — ABNORMAL LOW (ref 98–111)
Creatinine, Ser: 0.6 mg/dL — ABNORMAL LOW (ref 0.61–1.24)
GFR, Estimated: >60 mL/min (ref >60)
Glucose, Bld: 86 mg/dL (ref 70–99)
Potassium: 3.8 mmol/L (ref 3.5–5.1)
Sodium: 131 mmol/L — ABNORMAL LOW (ref 135–145)
Total Bilirubin: 0.6 mg/dL (ref 0.3–1.2)
Total Protein: 7.8 g/dL (ref 6.5–8.1)

## 2022-06-11 MED ORDER — IPRATROPIUM-ALBUTEROL 0.5-2.5 (3) MG/3ML IN SOLN: 3.0000 mL | Freq: Four times a day (QID) | RESPIRATORY_TRACT | Status: DC | PRN

## 2022-06-11 NOTE — Progress Notes (Signed)
   CCM eye-ball triage note  Called by EDP Patient has a pharyngeal mass with a history of very mild intermittent stridor.  Seen by ENT at [facility] when he was in the [facility] hospitalist was primary and CCM is consulting.  The plan was for him to have video laryngoscopic evaluation in the operating room followed by tracheostomy but he left [place].  The plan for this was at [facility].  He now returns to the [facility] emergency department requesting admission  I briefly saw him he had mild tachycardia.  He was sleeping.  No visible stridor.  When he spoke after waking up.  Hoarse voice and a laryngeal quality to the cough.  The stridor again was very mild and intermittent.  No paradoxical breathing.  He is said he was feeling stable.  Alert and oriented x3.  Pharyngeal mass Mild intermittent stridor  Can be admitted to stepdown unit under the hospitalist ENT consult recs recommended If after ENT procedure if he needs the ICU or at any time he decompensates CCM available-   -For any emergent airway for him it would require emergent tracheostomy [by ENT] or cricothyroidotomy if he cannot be bagged successfully until ENT arrives  -Intubation by cCM would not be the ideal approach      SIGNATURE    Dr. Brand Males, M.D., F.C.C.P,  Pulmonary and Critical Care Medicine Staff Physician, [place] Director - Interstitial Lung Disease  Program  Medical Director - [facility] [place] at [place], [place], [address]   Pager: [phone], If no answer  -[place] or [place] [phone] Telephone (clinical office): [phone] Telephone (research): [phone]  11:35 PM 06/11/2022

## 2022-06-11 NOTE — ED Notes (Signed)
Pt given food per Dr. Maylon Peppers.

## 2022-06-11 NOTE — Assessment & Plan Note (Addendum)
Secondary to larynx mass and vocal cord paresis. Does not appear in COPD exacerbation.  -Currently stable on room air.  -PRN duoneb for any wheezing or shortness of breath -management as above.

## 2022-06-11 NOTE — H&P (Addendum)
History and Physical    Patient: Daniel Marshall YTK:[id number] DOB: 1976/03/12 DOA: 06/11/2022 DOS: the patient was seen and examined on 06/11/2022 PCP: Pcp, No  Patient coming from: Home  Chief Complaint:  Chief Complaint  Patient presents with   Shortness of Breath   HPI: Daniel Marshall is a 46 y.o. male with medical history significant of HTN,COPD, newly diagnosed right laryngeal mass presenting with persistent shortness of breath, cough.  Pt was [facility] to [facility] yesterday for respiratory distress requiring Bipap. 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 Biopsy and tracheostomy was recommended and he originally had plans to transfer from [facility] to ICU service here at [facility]. However, pt eventually wanted to do this outpatient and left AMA.   He presents today with continued shortness of breath and now agreeable to further intervention. Says he left because he wanted to discuss with family. Shortness of breath is worse with exertion. He has hoarseness of voice that has been ongoing for at least a year. Has 20 year half a pack smoking hx.   In the ED, temperature was 98.37F, HR of 110, BP of 146/84 on room air.   WBC of 10.6, hemoglobin of 14.2  Sodium 131, potassium 3.8, creatinine of 0.60  Repeat CXR today was negative.  EDP did discuss with CCM who feels the patient is stable for stepdown unit tonight and can be consult following his tracheostomy. Review of Systems: As mentioned in the history of present illness. All other systems reviewed and are negative. Past Medical History:  Diagnosis Date   Asthma    as a child   Broken ribs    Hypertension    History reviewed. No pertinent surgical history. Social History:  reports that he has been smoking cigarettes. He has never used smokeless tobacco. He reports current alcohol use. He  reports current drug use. Drug: Marijuana.  No Known Allergies  Family History  Family history unknown: Yes    Prior to Admission medications   Medication Sig Start Date End Date Taking? Authorizing Provider  ibuprofen (ADVIL) 200 MG tablet Take 200-600 mg by mouth every 6 (six) hours as needed for fever or headache (pain).   Yes [provider]  Phenylephrine-DM-GG (MUCINEX FAST-MAX CONGEST COUGH PO) Take 2 tablets by mouth every 6 (six) hours as needed (cough). Patient not taking: Reported on 06/11/2022    [provider]    Physical Exam: Vitals:   06/11/22 1315 06/11/22 1724 06/11/22 2239 06/11/22 2330  BP:  (!) 113/90 (!) 148/98 132/88  Pulse:  (!) 107 (!) 110 100  Resp:  20 (!) 23 13  Temp:  98.4 F (36.9 C) 98.2 F (36.8 C)   TempSrc:  Oral Oral   SpO2:  91% 96% 94%  Weight: 67.6 kg     Height: '5\' 6"'$  (1.676 m)      Constitutional: NAD, calm, comfortable, nontoxic appearing middle-age male laying in bed Eyes:lids and conjunctivae normal ENMT: Mucous membranes are moist.  Uvula is deviated to the right without any erythema or edema.  Hoarseness of voice when speaking. neck: normal, supple,  Respiratory: Diminished breath sounds throughout but no obvious wheezing or crackles.  Normal respiratory effort. No accessory muscle use.   Cardiovascular: Regular rate and rhythm, no murmurs / rubs / gallops. No extremity edema. Abdomen: Soft, nontender nondistended.  Bowel sounds positive.  Musculoskeletal: no clubbing / cyanosis. No joint deformity upper and lower extremities. Good ROM, no contractures. Normal muscle tone.  Skin: no rashes, lesions, ulcers. No induration Neurologic: CN 2-12 grossly intact.  Strength 5/5 in all 4.  Psychiatric: Normal judgment and insight. Alert and oriented x 3. Normal mood. Data Reviewed:  See HPI  Assessment and Plan: * Laryngeal mass -pt admitted to [facility] yesterday and found on CT and fiberoptic exam with ENT to have right  larynx mass with vocal cord paresis.  Biopsy and tracheostomy was recommended.  However patient left AMA before transferring here to [facility] for further intervention. -ENT Dr. Janace Hoard was consulted by ED physician but recommended that the new daytime ENT group in the morning be reconsulted -CCM Dr. Chase Caller was also consulted by EDP and briefly evaluated pt.He recommends that patient be admitted to stepdown unit and to be re-consulted if needed following tracheostomy. If he deteriorates overnight he would need emergent tracheostomy or cricothyrotomy. Intubation from above would not be a good approach.  Shortness of breath Secondary to larynx mass and vocal cord paresis. Does not appear in COPD exacerbation.  -Currently stable on room air.  -PRN duoneb for any wheezing or shortness of breath -management as above.      Advance Care Planning:   Code Status: Full Code   Consults: needs re-consult to ENT in the morning  Family Communication: none at bedside  Severity of Illness: The appropriate patient status for this patient is INPATIENT. Inpatient status is judged to be reasonable and necessary in order to provide the required intensity of service to ensure the patient's safety. The patient's presenting symptoms, physical exam findings, and initial radiographic and laboratory data in the context of their chronic comorbidities is felt to place them at high risk for further clinical deterioration. Furthermore, it is not anticipated that the patient will be medically stable for discharge from the hospital within 2 midnights of admission.   * I certify that at the point of admission it is my clinical judgment that the patient will require inpatient hospital care spanning beyond 2 midnights from the point of admission due to high intensity of service, high risk for further deterioration and high frequency of surveillance required.*  Author: Orene Desanctis, DO 06/11/2022 11:58 PM  For on call review  [email or web address].

## 2022-06-11 NOTE — ED Triage Notes (Signed)
Patient arrives ambulatory by POV stating he was at [facility] and was needing to have surgery for his shortness of breath. Patient states he left AMA but was told to come here for further testing and is agreeing to be admitted today. Patient c/o continued shortness of breath. Speaking in full sentences but voice is hoarse. Patient adds his left wrist is swollen and painful after falling in hospital yesterday.

## 2022-06-11 NOTE — Assessment & Plan Note (Addendum)
-  pt admitted to [facility] yesterday and found on CT and fiberoptic exam with ENT to have right larynx mass with vocal cord paresis.  Biopsy and tracheostomy was recommended.  However patient left AMA before transferring here to [facility] for further intervention. -ENT Dr. Janace Hoard was consulted by ED physician but recommended that the new daytime ENT group in the morning be reconsulted -CCM Dr. Chase Caller was also consulted by EDP and briefly evaluated pt.He recommends that patient be admitted to stepdown unit and to be re-consulted if needed following tracheostomy. If he deteriorates overnight he would need emergent tracheostomy or cricothyrotomy. Intubation from above would not be a good approach.

## 2022-06-11 NOTE — ED Provider Triage Note (Signed)
Emergency Medicine Provider Triage Evaluation Note  Daniel Marshall , a 46 y.o. male  was evaluated in triage.  Pt complains of shortness of breath.  This has been ongoing for last several days.  Patient was seen and evaluated at [facility] yesterday and was on BiPAP.  Patient ultimately left AMA as he had "things to do."  He returns to the emergency room today not feeling any better.  Review of Systems  Positive:  Negative: See above   Physical Exam  BP (!) 146/84   Pulse (!) 102   Temp 98.3 F (36.8 C) (Oral)   Resp 18   Ht '5\' 6"'$  (1.676 m)   Wt 67.6 kg   SpO2 100%   BMI 24.05 kg/m  Gen:   Awake, no distress   Resp:  Normal effort  MSK:   Moves extremities without difficulty  Other:    Medical Decision Making  Medically screening exam initiated at 1:28 PM.  Appropriate orders placed.  Daniel Marshall was informed that the remainder of the evaluation will be completed by another provider, this initial triage assessment does not replace that evaluation, and the importance of remaining in the ED until their evaluation is complete.     Hendricks Limes, [place] 06/11/22 1329

## 2022-06-11 NOTE — ED Provider Notes (Signed)
[facility] EMERGENCY DEPARTMENT Provider Note   CSN: [id number] Arrival date & time: 06/11/22  1210     History  Chief Complaint  Patient presents with   Shortness of Breath    [place] is a 46 y.o. male.  Patient is a 46 year old male with a past nuchal history of COPD and recently diagnosed laryngeal mass presenting to the emergency department with shortness of breath.  Patient reports that over the last few months he has had increasing shortness of breath on exertion.  He states that few days ago his shortness of breath got severe and he went to [facility], ER.  He states that he was started on BiPAP and given treatments there was some improvement and then had work-up done by ENT that showed a mass in his throat.  He states that he was scheduled for a biopsy and trach however had personal business to deal with last night and left AGAINST MEDICAL ADVICE.  He states that the surgeons told him that they do not do the surgery at [facility] long and recommended that he come to [facility] should he want to be reevaluated.  He states that he has been having continued shortness of breath since being home without any improvement and is agreeable for admission at this time.  The history is provided by the patient.  Shortness of Breath      Home Medications Prior to Admission medications   Medication Sig Start Date End Date Taking? Authorizing Provider  ibuprofen (ADVIL) 200 MG tablet Take 200-600 mg by mouth every 6 (six) hours as needed for fever or headache (pain).   Yes [provider]  Phenylephrine-DM-GG (MUCINEX FAST-MAX CONGEST COUGH PO) Take 2 tablets by mouth every 6 (six) hours as needed (cough). Patient not taking: Reported on 06/11/2022    [provider]      Allergies    Patient has no known allergies.    Review of Systems   Review of Systems  Respiratory:  Positive for shortness of breath.     Physical Exam Updated Vital Signs BP  132/88   Pulse 100   Temp 98.2 F (36.8 C) (Oral)   Resp 13   Ht '5\' 6"'$  (1.676 m)   Wt 67.6 kg   SpO2 94%   BMI 24.05 kg/m  Physical Exam Vitals and nursing note reviewed.  Constitutional:      General: He is not in acute distress.    Appearance: He is well-developed.  HENT:     Head: Normocephalic and atraumatic.  Eyes:     Extraocular Movements: Extraocular movements intact.  Cardiovascular:     Rate and Rhythm: Normal rate and regular rhythm.     Heart sounds: Normal heart sounds.  Pulmonary:     Effort: Pulmonary effort is normal. No tachypnea, accessory muscle usage or respiratory distress.     Breath sounds: Stridor (conversational stridor at rest) present.  Abdominal:     Palpations: Abdomen is soft.     Tenderness: There is no abdominal tenderness.  Musculoskeletal:        General: Normal range of motion.     Cervical back: Normal range of motion and neck supple.  Skin:    General: Skin is warm and dry.  Neurological:     General: No focal deficit present.     Mental Status: He is alert and oriented to person, place, and time.  Psychiatric:        Mood and  Affect: Mood normal.        Behavior: Behavior normal.     ED Results / Procedures / Treatments   Labs (all labs ordered are listed, but only abnormal results are displayed) Labs Reviewed  CBC WITH DIFFERENTIAL/PLATELET - Abnormal; Notable for the following components:      Result Value   WBC 10.6 (*)    Neutro Abs 8.1 (*)    All other components within normal limits  COMPREHENSIVE METABOLIC PANEL - Abnormal; Notable for the following components:   Sodium 131 (*)    Chloride 94 (*)    Creatinine, Ser 0.60 (*)    Calcium 8.7 (*)    AST 67 (*)    All other components within normal limits  CBC    EKG EKG Interpretation  Date/Time:  Thursday June 11 2022 13:14:56 EST Ventricular Rate:  108 PR Interval:  130 QRS Duration: 86 QT Interval:  322 QTC Calculation: 431 R Axis:   80 Text  Interpretation: Sinus tachycardia Right atrial enlargement Minimal voltage criteria for LVH, may be normal variant ( Cornell product ) Borderline ECG No significant change since last tracing Confirmed by Leanord Asal ([id number]) on 06/11/2022 9:33:44 PM  Radiology DG Chest 2 View  Result Date: 06/11/2022 CLINICAL DATA:  Shortness of breath EXAM: CHEST - 2 VIEW COMPARISON:  06/10/2022 FINDINGS: The heart size and mediastinal contours are within normal limits. Both lungs are clear. The visualized skeletal structures are unremarkable. IMPRESSION: No active cardiopulmonary disease. Electronically Signed   By: Davina Poke D.O.   On: 06/11/2022 13:50   DG Wrist 2 Views Left  Result Date: 06/10/2022 CLINICAL DATA:  Fall pain EXAM: LEFT WRIST - 2 VIEW COMPARISON:  None Available. FINDINGS: There is no evidence of fracture or dislocation. There is no evidence of arthropathy or other focal bone abnormality. Soft tissues are unremarkable. IMPRESSION: Negative. Electronically Signed   By: Donavan Foil M.D.   On: 06/10/2022 16:27   CT SOFT TISSUE NECK W CONTRAST  Result Date: 06/10/2022 CLINICAL DATA:  Epiglottitis or tonsillitis. Follow-up previous abnormality right vocal cord. EXAM: CT NECK WITH CONTRAST TECHNIQUE: Multidetector CT imaging of the neck was performed using the standard protocol following the bolus administration of intravenous contrast. RADIATION DOSE REDUCTION: This exam was performed according to the departmental dose-optimization program which includes automated exposure control, adjustment of the mA and/or kV according to patient size and/or use of iterative reconstruction technique. CONTRAST:  17m OMNIPAQUE IOHEXOL 300 MG/ML  SOLN COMPARISON:  CT neck 01/06/2022 FINDINGS: Pharynx and larynx: Image quality degraded by extensive motion. 2 attempts were made to scan the neck both degraded by motion. Tonsils are symmetric.  No mass or abscess.  Epiglottis is normal. There is asymmetric  thickening of the right aryepiglottic fold which has progressed in the interval. There is asymmetric enlargement of the right laryngeal ventricle and piriform sinus, with progression from the prior study. No airway compromise Salivary glands: No inflammation, mass, or stone. Thyroid: Negative Lymph nodes: No enlarged lymph nodes in the neck. Vascular: Normal vascular enhancement. Mild atherosclerotic disease in the carotid artery bilaterally. Limited intracranial: Not imaged Visualized orbits: Negative Mastoids and visualized paranasal sinuses: Mild mucosal edema right and left maxillary sinus. Remaining sinuses clear. Mastoid clear. Skeleton: No acute abnormality Upper chest: Lung apices clear bilaterally Other: None IMPRESSION: 1. Image quality degraded by extensive motion. 2. Asymmetric thickening of the right aryepiglottic fold and asymmetric enlargement of the right laryngeal ventricle and piriform sinus. This has  progressed in the interval. 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 Recommend laryngoscopy. 3. No tonsillar abscess. No adenopathy in the neck. 4. Mild atherosclerotic disease in the carotid artery bilaterally. Electronically Signed   By: Franchot Gallo M.D.   On: 06/10/2022 13:38   DG Chest Port 1 View  Result Date: 06/10/2022 CLINICAL DATA:  Dyspnea EXAM: PORTABLE CHEST 1 VIEW COMPARISON:  01/06/2022 FINDINGS: Left basilar opacity likely represents a confluence of vascular and osseous shadows. The lungs are otherwise clear. No pneumothorax or pleural effusion. Cardiac size within normal limits. Pulmonary vascularity is normal. No acute bone abnormality. Healed bilateral rib fractures noted. IMPRESSION: 1. No active disease. Electronically Signed   By: Fidela Salisbury M.D.   On: 06/10/2022 01:45    Procedures Procedures    Medications Ordered in ED Medications  ipratropium-albuterol (DUONEB) 0.5-2.5 (3) MG/3ML nebulizer solution 3 mL (has no administration  in time range)    ED Course/ Medical Decision Making/ A&P Clinical Course as of 06/12/22 0004  Thu Jun 11, 2022  2215 I spoke with Dr. Janace Hoard of ENT who recommends admission to hospitalist and re-consulting ENT in the morning as his group is not on call tomorrow and would not be the group operating.  [VK]  2242 I spoke with Dr. Flossie Buffy, hospitalist who states that the plan was for admission to ICU for his trach placement.  Critical care will be consulted for possible admission to ICU service. [VK]  2312 I spoke with Dr. Reyes Ivan of ICU who evaluated the patient at bedside and states that the patient is stable for stepdown unit for preop airway monitoring and recommended reconsult postop if the patient is requiring the ventilator or has any postop complications requiring ICU. [VK]    Clinical Course User Index [VK] Kingsley, Olancha Decision Making This patient presents to the ED with chief complaint(s) of shortness of breath with pertinent past medical history of COPD, recently diagnosed laryngeal mass which further complicates the presenting complaint. The complaint involves an extensive differential diagnosis and also carries with it a high risk of complications and morbidity.    The differential diagnosis includes COPD exacerbation, airway obstruction from laryngeal mass, no evidence of respiratory failure on exam, possible pneumonia, pneumothorax, pleural effusion, pulmonary edema, viral syndrome  Additional history obtained: Additional history obtained from N/A Records reviewed previous admission documents  ED Course and Reassessment: Patient was initially evaluated by provider in triage and had repeat labs and chest x-ray performed.  X-ray showed no acute disease.  The patient does have some stridor at rest but is in no acute respiratory distress and satting well on room air.  ENT will be consulted and the patient will likely require readmission for  operative planning.  Independent labs interpretation:  The following labs were independently interpreted: Hyponatremia otherwise within normal range  Independent visualization of imaging: - I independently visualized the following imaging with scope of interpretation limited to determining acute life threatening conditions related to emergency care: Chest x-ray, which revealed no acute disease  Consultation: - Consulted or discussed management/test interpretation w/ external professional: ENT, hospitalist, critical care  Consideration for admission or further workup: Patient requires admission for further work-up of his stridor and laryngeal mass Social Determinants of health: N/A  Risk Decision regarding hospitalization.  Final Clinical Impression(s) / ED Diagnoses Final diagnoses:  Laryngeal mass  SOB (shortness of breath)  Stridor    Rx / DC Orders ED Discharge Orders     None         Kemper Durie, DO 06/12/22 0004

## 2022-06-12 LAB — CBC
HCT: 40.1 % (ref 39.0–52.0)
Hemoglobin: 14.1 g/dL (ref 13.0–17.0)
MCH: 30.1 pg (ref 26.0–34.0)
MCHC: 35.2 g/dL (ref 30.0–36.0)
MCV: 85.5 fL (ref 80.0–100.0)
Platelets: 217 10*3/uL (ref 150–400)
RBC: 4.69 MIL/uL (ref 4.22–5.81)
RDW: 15 % (ref 11.5–15.5)
WBC: 6.9 10*3/uL (ref 4.0–10.5)
nRBC: 0 % (ref 0.0–0.2)

## 2022-06-12 MED ORDER — ACETAMINOPHEN 325 MG PO TABS
650.0000 mg | ORAL_TABLET | Freq: Four times a day (QID) | ORAL | Status: DC | PRN
  Administered 2022-06-15 – 2022-06-16 (×3): 650 mg via ORAL
  Filled 2022-06-12 (×4): qty 2

## 2022-06-12 MED ORDER — METHYLPREDNISOLONE SODIUM SUCC 40 MG IJ SOLR
40.0000 mg | Freq: Two times a day (BID) | INTRAMUSCULAR | Status: DC
  Administered 2022-06-12: 40 mg via INTRAVENOUS
  Filled 2022-06-12: qty 1

## 2022-06-12 MED ORDER — ONDANSETRON HCL 4 MG/2ML IJ SOLN: 4.0000 mg | Freq: Four times a day (QID) | INTRAMUSCULAR | Status: DC | PRN

## 2022-06-12 NOTE — Consult Note (Signed)
ENT CONSULT:  Reason for Consult: Larynx cancer  Referring Physician:  Melissa Montane MD (ENT), Ileene Musa DO (Hospitalist)  HPI: Daniel Marshall is an 46 y.o. male right larygneal mass with radiographic evidence of tumor since at least June 2023. Patient was admitted to [facility] long ED but left AMA prior to Dr. Janace Hoard having an opporunity to offer DL/ Biopsy and possible tracheostomy. Patient reports several months history of worsening breathing and hoarseness. No dysphagia/odynophagia weight loss or neck adenopathy. No hemoptysis. He has been admitted for presumed COPD exacerbation 01/06/22 (which was likely due to larynx cancer) and again 06/10/22 but left AMA yesterday. He is back now and would like to proceed with treatment.   Endorses 28 pack year smoking history and 3-4 alcoholic beverages daily. Works for [employer].   Denies cardiac or pulmonary disease. No blood thinners/  Currently admitted to hospital ist medicine.    Past Medical History:  Diagnosis Date   Asthma    as a child   Broken ribs    Hypertension     History reviewed. No pertinent surgical history.  Family History  Family history unknown: Yes    Social History:  reports that he has been smoking cigarettes. He has never used smokeless tobacco. He reports current alcohol use. He reports current drug use. Drug: Marijuana.  Allergies: No Known Allergies  Medications: I have reviewed the patient's current medications.  Results for orders placed or performed during the hospital encounter of 06/11/22 (from the past 48 hour(s))  CBC with Differential     Status: Abnormal   Collection Time: 06/11/22  1:48 PM  Result Value Ref Range   WBC 10.6 (H) 4.0 - 10.5 K/uL   RBC 4.78 4.22 - 5.81 MIL/uL   Hemoglobin 14.2 13.0 - 17.0 g/dL   HCT 41.8 39.0 - 52.0 %   MCV 87.4 80.0 - 100.0 fL   MCH 29.7 26.0 - 34.0 pg   MCHC 34.0 30.0 - 36.0 g/dL   RDW 14.8 11.5 - 15.5 %   Platelets 230 150 - 400 K/uL   nRBC 0.0 0.0 - 0.2 %    Neutrophils Relative % 76 %   Neutro Abs 8.1 (H) 1.7 - 7.7 K/uL   Lymphocytes Relative 14 %   Lymphs Abs 1.5 0.7 - 4.0 K/uL   Monocytes Relative 9 %   Monocytes Absolute 0.9 0.1 - 1.0 K/uL   Eosinophils Relative 0 %   Eosinophils Absolute 0.0 0.0 - 0.5 K/uL   Basophils Relative 0 %   Basophils Absolute 0.0 0.0 - 0.1 K/uL   Immature Granulocytes 1 %   Abs Immature Granulocytes 0.05 0.00 - 0.07 K/uL    Comment: Performed at [facility], [address], [place], [place] [address]  Comprehensive metabolic panel     Status: Abnormal   Collection Time: 06/11/22  1:48 PM  Result Value Ref Range   Sodium 131 (L) 135 - 145 mmol/L   Potassium 3.8 3.5 - 5.1 mmol/L   Chloride 94 (L) 98 - 111 mmol/L   CO2 27 22 - 32 mmol/L   Glucose, Bld 86 70 - 99 mg/dL    Comment: Glucose reference range applies only to samples taken after fasting for at least 8 hours.   BUN 7 6 - 20 mg/dL   Creatinine, Ser 0.60 (L) 0.61 - 1.24 mg/dL   Calcium 8.7 (L) 8.9 - 10.3 mg/dL   Total Protein 7.8 6.5 - 8.1 g/dL  Albumin 3.5 3.5 - 5.0 g/dL   AST 67 (H) 15 - 41 U/L   ALT 31 0 - 44 U/L   Alkaline Phosphatase 74 38 - 126 U/L   Total Bilirubin 0.6 0.3 - 1.2 mg/dL   GFR, Estimated >60 >60 mL/min    Comment: (NOTE) Calculated using the CKD-EPI Creatinine Equation (2021)    Anion gap 10 5 - 15    Comment: Performed at [facility] [address], [place] [address]  CBC     Status: None   Collection Time: 06/12/22  5:08 AM  Result Value Ref Range   WBC 6.9 4.0 - 10.5 K/uL   RBC 4.69 4.22 - 5.81 MIL/uL   Hemoglobin 14.1 13.0 - 17.0 g/dL   HCT 40.1 39.0 - 52.0 %   MCV 85.5 80.0 - 100.0 fL   MCH 30.1 26.0 - 34.0 pg   MCHC 35.2 30.0 - 36.0 g/dL   RDW 15.0 11.5 - 15.5 %   Platelets 217 150 - 400 K/uL   nRBC 0.0 0.0 - 0.2 %    Comment: Performed at [facility], [place] [address], [place], [place] [address]    DG Chest 2 View  Result Date: 06/11/2022 CLINICAL DATA:  Shortness of  breath EXAM: CHEST - 2 VIEW COMPARISON:  06/10/2022 FINDINGS: The heart size and mediastinal contours are within normal limits. Both lungs are clear. The visualized skeletal structures are unremarkable. IMPRESSION: No active cardiopulmonary disease. Electronically Signed   By: Davina Poke D.O.   On: 06/11/2022 13:50   DG Wrist 2 Views Left  Result Date: 06/10/2022 CLINICAL DATA:  Fall pain EXAM: LEFT WRIST - 2 VIEW COMPARISON:  None Available. FINDINGS: There is no evidence of fracture or dislocation. There is no evidence of arthropathy or other focal bone abnormality. Soft tissues are unremarkable. IMPRESSION: Negative. Electronically Signed   By: Donavan Foil M.D.   On: 06/10/2022 16:27   CT SOFT TISSUE NECK W CONTRAST  Result Date: 06/10/2022 CLINICAL DATA:  Epiglottitis or tonsillitis. Follow-up previous abnormality right vocal cord. EXAM: CT NECK WITH CONTRAST TECHNIQUE: Multidetector CT imaging of the neck was performed using the standard protocol following the bolus administration of intravenous contrast. RADIATION DOSE REDUCTION: This exam was performed according to the departmental dose-optimization program which includes automated exposure control, adjustment of the mA and/or kV according to patient size and/or use of iterative reconstruction technique. CONTRAST:  66m OMNIPAQUE IOHEXOL 300 MG/ML  SOLN COMPARISON:  CT neck 01/06/2022 FINDINGS: Pharynx and larynx: Image quality degraded by extensive motion. 2 attempts were made to scan the neck both degraded by motion. Tonsils are symmetric.  No mass or abscess.  Epiglottis is normal. There is asymmetric thickening of the right aryepiglottic fold which has progressed in the interval. There is asymmetric enlargement of the right laryngeal ventricle and piriform sinus, with progression from the prior study. No airway compromise Salivary glands: No inflammation, mass, or stone. Thyroid: Negative Lymph nodes: No enlarged lymph nodes in the neck.  Vascular: Normal vascular enhancement. Mild atherosclerotic disease in the carotid artery bilaterally. Limited intracranial: Not imaged Visualized orbits: Negative Mastoids and visualized paranasal sinuses: Mild mucosal edema right and left maxillary sinus. Remaining sinuses clear. Mastoid clear. Skeleton: No acute abnormality Upper chest: Lung apices clear bilaterally Other: None IMPRESSION: 1. Image quality degraded by extensive motion. 2. Asymmetric thickening of the right aryepiglottic fold and asymmetric enlargement of the right laryngeal ventricle and piriform sinus. This has  progressed in the interval. 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 Recommend laryngoscopy. 3. No tonsillar abscess. No adenopathy in the neck. 4. Mild atherosclerotic disease in the carotid artery bilaterally. Electronically Signed   By: Franchot Gallo M.D.   On: 06/10/2022 13:38    KGM:[id number] other than stated per HPI  Blood pressure 118/80, pulse 96, temperature 98.7 F (37.1 C), temperature source Oral, resp. rate 12, height '5\' 6"'$  (1.676 m), weight 67.6 kg, SpO2 93 %.  PHYSICAL EXAM:  CONSTITUTIONAL: well developed, nourished, no distress and alert and oriented x 3. Severe dysphonia. Mild inspiratory stridor. No respiratory distress; on room air. EYES: PERRL, EOMI  HENT: Head : normocephalic and atraumatic Ears: Right ear:   canal normal, external ear normal and hearing normal Left ear:   canal normal, external ear normal and hearing normal Nose: nose normal and no purulence Mouth/Throat:  Mouth: uvula midline and no oral lesions Throat: oropharynx clear and moist Mucous membranes: normal EYES: conjunctiva normal, EOM normal and PERRL NECK: supple, trachea normal and no thyromegaly or cervical LAD NEURO: CN II-XII symmetric intact   Preop diagnosis: Larynx cancer Postop diagnosis: same Procedure: Transnasal fiberoptic laryngoscopy Surgeon: Bralin Garry Anesth: N/A Compl:  None Findings: Right glottic/supraglottic larynx cancer.  Right vocal fold fixation. Left vocal fold hypomobility. 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  Procedure Note: 31575(Flex lary) informed verbal consent was obtained after explaining the risks (including bleeding and infection), benefits and alternatives of the procedure. Verbal timeout was performed prior to the procedure. The nose was topicalized with topical lidocaine/oxymetazoline. The  scope was advanced through the right nasal cavity. The septum and turbinates appeared normal. The middle meatus was free of polyps of purulence. The eustachian tube, choana, and adenoids were normal in appearance. Right glottic/supraglottic larynx cancer.  Right vocal fold fixation. Left vocal fold hypomobility. 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 The hypopharynx, appears normal The visualized portion of the subglottis appeared normal. The patient tolerated the procedure with no immediate complications.    Studies Reviewed: CT Neck with contrast 06/10/22 IMPRESSION: 1. Image quality degraded by extensive motion. 2. Asymmetric thickening of the right aryepiglottic fold and asymmetric enlargement of the right laryngeal ventricle and piriform sinus. This has progressed in the interval. 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 Recommend laryngoscopy. 3. No tonsillar abscess. No adenopathy in the neck. 4. Mild atherosclerotic disease in the carotid artery bilaterally.     Electronically Signed   By: Franchot Gallo M.D.   On: 06/10/2022 13:38  CT angio chest PE protocol 01/06/22 IMPRESSION: 1. No acute pulmonary embolus identified.   2. Asymmetric larynx, see Neck CT today reported separately. No thoracic inlet or mediastinal mass or abnormality identified.   3. Emphysema (ICD10-J43.9). A 5 mm left upper lobe lung nodule is new since 2019. As is a small area of  patchy inflammatory/postinflammatory appearing opacity in the right middle lobe. A non-contrast Chest CT at 12 months is optional. If performed and the nodule is stable at 12 months, no further follow-up is recommended. These guidelines do not apply to immunocompromised patients and patients with cancer. Follow up in patients with significant comorbidities as clinically warranted. For lung cancer screening, adhere to Lung-RADS guidelines. Reference: Radiology. 2017; 284(1):228-43.    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  At rest he is relatively stable his airway however endoscopic exam demonstrates right vocal fold fixation and left vocal fold hypomobility.  In an emergency he may be intubated with a small ET tube.  However I discussed that the safest option would be proceeding with an awake tracheostomy followed by direct laryngoscopy with biopsies as soon as tomorrow.  Discussed other options including deferring definitive treatment or waiting for definitive treatment which would include total laryngectomy or organ preservation therapy with chemoradiation therapy.  However in my experience tumors this advanced have risk of doing poorly with chemo rads and resulting in the need for tracheostomy anyways.  Patient is amenable to proceeding with aggressive therapy.  He is young and otherwise fairly healthy.  We will plan for awake tracheostomy, direct laryngoscopy with biopsies tomorrow under general anesthesia.  Appreciate medicine primary team management.  I recommended an inpatient full body PET/CT for further staging.   Continuous pulse oximetry on the floor would be a reasonable measure to monitor patient's respiratory status. N.p.o. after midnight  Informed consent obtained.  Risk discussed including pain, bleeding, nerve  infection, loss of airway, injury to the esophagus, need for further surgery, risk of anesthesia including including heart attack, stroke, death and disability.  Patient is willing to proceed tomorrow.  I have personally spent 46 minutes involved in face-to-face and non-face-to-face activities for this patient on the day of the visit.  Professional time spent includes the following activities, in addition to those noted in the documentation: preparing to see the patient (eg, review of tests), obtaining and/or reviewing separately obtained history, performing a medically appropriate examination and/or evaluation, counseling and educating the patient/family/caregiver, ordering medications, tests or procedures, referring and communicating with other healthcare professionals, documenting clinical information in the electronic or other health record, independently interpreting results and communicating results with the patient/family/caregiver, care coordination.  Electronically signed by:  Jenetta Downer, MD  Staff Physician Facial Plastic & Reconstructive Surgery Otolaryngology - Head and Neck Surgery [facility], [place]   06/12/2022, 11:17 AM

## 2022-06-12 NOTE — ED Notes (Signed)
Pt ambulated to bathroom independently tolerated well

## 2022-06-12 NOTE — Progress Notes (Signed)
PROGRESS NOTE        PATIENT DETAILS Name: Daniel Marshall Age: 46 y.o. Sex: male Date of Birth: 1976-01-05 Admit Date: 06/11/2022 Admitting Physician Orene Desanctis, DO PCP:Pcp, No  Brief Summary: Patient is a 46 y.o.  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  Significant events: 11/15>> admit to [facility] to have a laryngeal mass-signed out AMA. 11/16>> presented to [facility]-admit to [facility].  Significant studies: 11/15>> CT soft tissue neck: Asymmetric thickening of the right aryepiglottic fold, asymmetric enlargement of the right laryngeal ventricle and pyriform sinus.  Significant microbiology data: None  Procedures:   Consults: None  Subjective: Lying comfortably in bed-denies any chest pain or shortness of breath.  Objective: Vitals: Blood pressure 118/80, pulse 96, temperature 98.7 F (37.1 C), temperature source Oral, resp. rate 12, height '5\' 6"'$  (1.676 m), weight 67.6 kg, SpO2 93 %.   Exam: Gen Exam:Alert awake-not in any distress HEENT:atraumatic, normocephalic Chest: B/L clear to auscultation anteriorly CVS:S1S2 regular Abdomen:soft non tender, non distended Extremities:no edema Neurology: Non focal Skin: no rash  Pertinent Labs/Radiology:    Latest Ref Rng & Units 06/12/2022    5:08 AM 06/11/2022    1:48 PM 06/10/2022    1:25 AM  CBC  WBC 4.0 - 10.5 K/uL 6.9  10.6  8.7   Hemoglobin 13.0 - 17.0 g/dL 14.1  14.2  14.8   Hematocrit 39.0 - 52.0 % 40.1  41.8  44.0   Platelets 150 - 400 K/uL 217  230  190     Lab Results  Component Value Date   NA 131 (L) 06/11/2022   K 3.8 06/11/2022   CL 94 (L) 06/11/2022   CO2 27 06/11/2022      Assessment/Plan: Laryngeal mass Spoke with ENT MD Dr. Jeneen Rinks then with Dr Sabino Gasser this morning-keep  n.p.o.-await further recommendations.  COPD Not in exacerbation-we will stop steroids. Continue bronchodilators  Tobacco abuse Counseled.   BMI: Estimated body mass index is 24.05 kg/m as calculated from the following:   Height as of this encounter: '5\' 6"'$  (1.676 m).   Weight as of this encounter: 67.6 kg.   Code status:   Code Status: Full Code   DVT Prophylaxis: SCDs Start: 06/11/22 2348     Family Communication: None at bedside   Disposition Plan: Status is: Inpatient Remains inpatient appropriate because: Laryngeal mass-possible need for biopsy/tracheotomy.   Planned Discharge Destination:Home sometime next week.   Diet: Diet Order             Diet NPO time specified  Diet effective now                     Antimicrobial agents: Anti-infectives (From admission, onward)    None        MEDICATIONS: Scheduled Meds:  methylPREDNISolone (SOLU-MEDROL) injection  40 mg Intravenous Q12H   Continuous Infusions: PRN Meds:.acetaminophen, ipratropium-albuterol, ondansetron (ZOFRAN) IV   I have personally reviewed following labs and imaging studies  LABORATORY DATA: CBC: Recent Labs  Lab 06/10/22 0125 06/11/22 1348 06/12/22 0508  WBC 8.7 10.6* 6.9  NEUTROABS 5.8 8.1*  --   HGB 14.8 14.2 14.1  HCT 44.0 41.8 40.1  MCV 87.6 87.4 85.5  PLT 190 230 300    Basic Metabolic Panel: Recent Labs  Lab 06/10/22 0125 06/11/22 1348  NA 131* 131*  K 3.5 3.8  CL 91* 94*  CO2 26 27  GLUCOSE 118* 86  BUN 10 7  CREATININE 0.74 0.60*  CALCIUM 9.3 8.7*    GFR: Estimated Creatinine Clearance: 103 mL/min (A) (by C-G formula based on SCr of 0.6 mg/dL (L)).  Liver Function Tests: Recent Labs  Lab 06/11/22 1348  AST 67*  ALT 31  ALKPHOS 74  BILITOT 0.6  PROT 7.8  ALBUMIN 3.5   No results for input(s): "LIPASE", "AMYLASE" in the last 168 hours. No results for input(s): "AMMONIA" in the last 168 hours.  Coagulation Profile: No results for  input(s): "INR", "PROTIME" in the last 168 hours.  Cardiac Enzymes: No results for input(s): "CKTOTAL", "CKMB", "CKMBINDEX", "TROPONINI" in the last 168 hours.  BNP (last 3 results) No results for input(s): "PROBNP" in the last 8760 hours.  Lipid Profile: No results for input(s): "CHOL", "HDL", "LDLCALC", "TRIG", "CHOLHDL", "LDLDIRECT" in the last 72 hours.  Thyroid Function Tests: No results for input(s): "TSH", "T4TOTAL", "FREET4", "T3FREE", "THYROIDAB" in the last 72 hours.  Anemia Panel: No results for input(s): "VITAMINB12", "FOLATE", "FERRITIN", "TIBC", "IRON", "RETICCTPCT" in the last 72 hours.  Urine analysis:    Component Value Date/Time   COLORURINE YELLOW 05/19/2014 1805   APPEARANCEUR CLEAR 05/19/2014 1805   LABSPEC 1.015 08/11/2017 1559   PHURINE 6.0 08/11/2017 1559   GLUCOSEU NEGATIVE 08/11/2017 1559   HGBUR NEGATIVE 08/11/2017 1559   BILIRUBINUR NEGATIVE 08/11/2017 1559   KETONESUR NEGATIVE 08/11/2017 1559   PROTEINUR NEGATIVE 08/11/2017 1559   UROBILINOGEN 1.0 08/11/2017 1559   NITRITE NEGATIVE 08/11/2017 1559   LEUKOCYTESUR NEGATIVE 08/11/2017 1559    Sepsis Labs: Lactic Acid, Venous No results found for: "LATICACIDVEN"  MICROBIOLOGY: Recent Results (from the past 240 hour(s))  Resp Panel by RT-PCR (Flu A&B, Covid) Anterior Nasal Swab     Status: None   Collection Time: 06/10/22  1:36 AM   Specimen: Anterior Nasal Swab  Result Value Ref Range Status   SARS Coronavirus 2 by RT PCR NEGATIVE NEGATIVE Final    Comment: (NOTE) SARS-CoV-2 target nucleic acids are NOT DETECTED.  The SARS-CoV-2 RNA is generally detectable in upper respiratory specimens during the acute phase of infection. The lowest concentration of SARS-CoV-2 viral copies this assay can detect is 138 copies/mL. A negative result does not preclude SARS-Cov-2 infection and should not be used as the sole basis for treatment or other patient management decisions. A negative result may occur  with  improper specimen collection/handling, submission of specimen other than nasopharyngeal swab, presence of viral mutation(s) within the areas targeted by this assay, and inadequate number of viral copies(<138 copies/mL). A negative result must be combined with clinical observations, patient history, and epidemiological information. The expected result is Negative.  Fact Sheet for Patients:  EntrepreneurPulse.com.au  Fact Sheet for Healthcare Providers:  IncredibleEmployment.be  This test is no t yet approved or cleared by the [place] FDA and  has been authorized for detection and/or diagnosis of SARS-CoV-2 by FDA under an Emergency Use Authorization (EUA). This EUA will remain  in effect (meaning this test can be used) for the duration of the COVID-19 declaration under Section 564(b)(1) of the Act, 21 U.S.C.section 360bbb-3(b)(1), unless the authorization is terminated  or revoked sooner.       Influenza A by PCR NEGATIVE NEGATIVE Final  Influenza B by PCR NEGATIVE NEGATIVE Final    Comment: (NOTE) The Xpert Xpress SARS-CoV-2/FLU/RSV plus assay is intended as an aid in the diagnosis of influenza from Nasopharyngeal swab specimens and should not be used as a sole basis for treatment. Nasal washings and aspirates are unacceptable for Xpert Xpress SARS-CoV-2/FLU/RSV testing.  Fact Sheet for Patients: EntrepreneurPulse.com.au  Fact Sheet for Healthcare Providers: IncredibleEmployment.be  This test is not yet approved or cleared by the [place] FDA and has been authorized for detection and/or diagnosis of SARS-CoV-2 by FDA under an Emergency Use Authorization (EUA). This EUA will remain in effect (meaning this test can be used) for the duration of the COVID-19 declaration under Section 564(b)(1) of the Act, 21 U.S.C. section 360bbb-3(b)(1), unless the authorization is terminated  or revoked.  Performed at [facility], [place] [address], [place], [place] [address]   MRSA Next Gen by PCR, Nasal     Status: None   Collection Time: 06/10/22  5:08 AM   Specimen: Nasal Mucosa; Nasal Swab  Result Value Ref Range Status   MRSA by PCR Next Gen NOT DETECTED NOT DETECTED Final    Comment: (NOTE) The GeneXpert MRSA Assay (FDA approved for NASAL specimens only), is one component of a comprehensive MRSA colonization surveillance program. It is not intended to diagnose MRSA infection nor to guide or monitor treatment for MRSA infections. Test performance is not FDA approved in patients less than 40 years old. Performed at [facility], [place] [address], [place], [place] [address]   Respiratory (~20 pathogens) panel by PCR     Status: None   Collection Time: 06/10/22 11:59 AM   Specimen: Nasopharyngeal Swab; Respiratory  Result Value Ref Range Status   Adenovirus NOT DETECTED NOT DETECTED Final   Coronavirus 229E NOT DETECTED NOT DETECTED Final    Comment: (NOTE) The Coronavirus on the Respiratory Panel, DOES NOT test for the novel  Coronavirus (2019 nCoV)    Coronavirus HKU1 NOT DETECTED NOT DETECTED Final   Coronavirus NL63 NOT DETECTED NOT DETECTED Final   Coronavirus OC43 NOT DETECTED NOT DETECTED Final   Metapneumovirus NOT DETECTED NOT DETECTED Final   Rhinovirus / Enterovirus NOT DETECTED NOT DETECTED Final   Influenza A NOT DETECTED NOT DETECTED Final   Influenza B NOT DETECTED NOT DETECTED Final   Parainfluenza Virus 1 NOT DETECTED NOT DETECTED Final   Parainfluenza Virus 2 NOT DETECTED NOT DETECTED Final   Parainfluenza Virus 3 NOT DETECTED NOT DETECTED Final   Parainfluenza Virus 4 NOT DETECTED NOT DETECTED Final   Respiratory Syncytial Virus NOT DETECTED NOT DETECTED Final   Bordetella pertussis NOT DETECTED NOT DETECTED Final   Bordetella Parapertussis NOT DETECTED NOT DETECTED Final   Chlamydophila pneumoniae NOT  DETECTED NOT DETECTED Final   Mycoplasma pneumoniae NOT DETECTED NOT DETECTED Final    Comment: Performed at [facility], [place]. [address], [place], [place] [address]    RADIOLOGY STUDIES/RESULTS: DG Chest 2 View  Result Date: 06/11/2022 CLINICAL DATA:  Shortness of breath EXAM: CHEST - 2 VIEW COMPARISON:  06/10/2022 FINDINGS: The heart size and mediastinal contours are within normal limits. Both lungs are clear. The visualized skeletal structures are unremarkable. IMPRESSION: No active cardiopulmonary disease. Electronically Signed   By: Davina Poke D.O.   On: 06/11/2022 13:50   DG Wrist 2 Views Left  Result Date: 06/10/2022 CLINICAL DATA:  Fall pain EXAM: LEFT WRIST - 2 VIEW COMPARISON:  None Available. FINDINGS: There is  no evidence of fracture or dislocation. There is no evidence of arthropathy or other focal bone abnormality. Soft tissues are unremarkable. IMPRESSION: Negative. Electronically Signed   By: Donavan Foil M.D.   On: 06/10/2022 16:27   CT SOFT TISSUE NECK W CONTRAST  Result Date: 06/10/2022 CLINICAL DATA:  Epiglottitis or tonsillitis. Follow-up previous abnormality right vocal cord. EXAM: CT NECK WITH CONTRAST TECHNIQUE: Multidetector CT imaging of the neck was performed using the standard protocol following the bolus administration of intravenous contrast. RADIATION DOSE REDUCTION: This exam was performed according to the departmental dose-optimization program which includes automated exposure control, adjustment of the mA and/or kV according to patient size and/or use of iterative reconstruction technique. CONTRAST:  51m OMNIPAQUE IOHEXOL 300 MG/ML  SOLN COMPARISON:  CT neck 01/06/2022 FINDINGS: Pharynx and larynx: Image quality degraded by extensive motion. 2 attempts were made to scan the neck both degraded by motion. Tonsils are symmetric.  No mass or abscess.  Epiglottis is normal. There is asymmetric thickening of the right aryepiglottic fold which has  progressed in the interval. There is asymmetric enlargement of the right laryngeal ventricle and piriform sinus, with progression from the prior study. No airway compromise Salivary glands: No inflammation, mass, or stone. Thyroid: Negative Lymph nodes: No enlarged lymph nodes in the neck. Vascular: Normal vascular enhancement. Mild atherosclerotic disease in the carotid artery bilaterally. Limited intracranial: Not imaged Visualized orbits: Negative Mastoids and visualized paranasal sinuses: Mild mucosal edema right and left maxillary sinus. Remaining sinuses clear. Mastoid clear. Skeleton: No acute abnormality Upper chest: Lung apices clear bilaterally Other: None IMPRESSION: 1. Image quality degraded by extensive motion. 2. Asymmetric thickening of the right aryepiglottic fold and asymmetric enlargement of the right laryngeal ventricle and piriform sinus. This has progressed in the interval. 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 Recommend laryngoscopy. 3. No tonsillar abscess. No adenopathy in the neck. 4. Mild atherosclerotic disease in the carotid artery bilaterally. Electronically Signed   By: CFranchot GalloM.D.   On: 06/10/2022 13:38     LOS: 1 day   SOren Binet MD  [facility]    To contact the attending provider between 7A-7P or the covering provider during after hours 7P-7A, please log into the web site [email or web address] and access using universal [place] password for that web site. If you do not have the password, please call the hospital operator.  06/12/2022, 10:57 AM

## 2022-06-12 NOTE — ED Notes (Signed)
ENT at bedside requesting ENT cart which has been placed at bedside. Notified attending MD

## 2022-06-12 NOTE — ED Notes (Signed)
Per ENT, no surgery today, plan for trach and biopsy tomorrow. Notified Ghimire MD

## 2022-06-13 ENCOUNTER — Encounter (HOSPITAL_COMMUNITY): Payer: Self-pay | Admitting: Family Medicine

## 2022-06-13 ENCOUNTER — Inpatient Hospital Stay (HOSPITAL_COMMUNITY): Payer: Medicaid Other | Admitting: Certified Registered"

## 2022-06-13 ENCOUNTER — Inpatient Hospital Stay (HOSPITAL_COMMUNITY): Payer: Self-pay | Admitting: Certified Registered"

## 2022-06-13 ENCOUNTER — Other Ambulatory Visit: Payer: Self-pay

## 2022-06-13 ENCOUNTER — Encounter (HOSPITAL_COMMUNITY)
Admission: EM | Disposition: A | Discharge status: Home Health | Payer: Self-pay | Source: Home / Self Care | Attending: Internal Medicine

## 2022-06-13 ENCOUNTER — Inpatient Hospital Stay (HOSPITAL_COMMUNITY): Payer: Medicaid Other

## 2022-06-13 DIAGNOSIS — J449 Chronic obstructive pulmonary disease, unspecified: Secondary | ICD-10-CM

## 2022-06-13 DIAGNOSIS — F17210 Nicotine dependence, cigarettes, uncomplicated: Secondary | ICD-10-CM

## 2022-06-13 DIAGNOSIS — J387 Other diseases of larynx: Secondary | ICD-10-CM

## 2022-06-13 DIAGNOSIS — R221 Localized swelling, mass and lump, neck: Secondary | ICD-10-CM | POA: Diagnosis present

## 2022-06-13 DIAGNOSIS — I10 Essential (primary) hypertension: Secondary | ICD-10-CM

## 2022-06-13 HISTORY — PX: DIRECT LARYNGOSCOPY: SHX5326

## 2022-06-13 HISTORY — PX: TRACHEOSTOMY TUBE PLACEMENT: SHX814

## 2022-06-13 LAB — GLUCOSE, CAPILLARY: Glucose-Capillary: 98 mg/dL (ref 70–99)

## 2022-06-13 SURGERY — LARYNGOSCOPY, DIRECT
Anesthesia: General | Site: Neck

## 2022-06-13 MED ORDER — LOSARTAN POTASSIUM 50 MG PO TABS
50.0000 mg | ORAL_TABLET | Freq: Every day | ORAL | Status: DC
  Administered 2022-06-14 – 2022-06-19 (×6): 50 mg via ORAL
  Filled 2022-06-13 (×6): qty 1

## 2022-06-13 MED ORDER — ACETAMINOPHEN 10 MG/ML IV SOLN: 1000.0000 mg | Freq: Once | INTRAVENOUS | Status: DC | PRN

## 2022-06-13 MED ORDER — LIDOCAINE-EPINEPHRINE 1 %-1:100000 IJ SOLN
INTRAMUSCULAR | Status: AC
  Filled 2022-06-13: qty 1

## 2022-06-13 MED ORDER — GLYCOPYRROLATE 0.2 MG/ML IJ SOLN
INTRAMUSCULAR | Status: DC | PRN
  Administered 2022-06-13: .2 mg via INTRAVENOUS

## 2022-06-13 MED ORDER — EPINEPHRINE HCL (NASAL) 0.1 % NA SOLN
NASAL | Status: DC | PRN
  Administered 2022-06-13: 3 mL via TOPICAL

## 2022-06-13 MED ORDER — MIDAZOLAM HCL 2 MG/2ML IJ SOLN
INTRAMUSCULAR | Status: DC | PRN
  Administered 2022-06-13: 2 mg via INTRAVENOUS

## 2022-06-13 MED ORDER — DEXAMETHASONE SODIUM PHOSPHATE 10 MG/ML IJ SOLN
INTRAMUSCULAR | Status: DC | PRN
  Administered 2022-06-13: 10 mg via INTRAVENOUS

## 2022-06-13 MED ORDER — PHENYLEPHRINE 80 MCG/ML (10ML) SYRINGE FOR IV PUSH (FOR BLOOD PRESSURE SUPPORT)
PREFILLED_SYRINGE | INTRAVENOUS | Status: DC | PRN
  Administered 2022-06-13: 80 ug via INTRAVENOUS

## 2022-06-13 MED ORDER — KETAMINE HCL 50 MG/5ML IJ SOSY
PREFILLED_SYRINGE | INTRAMUSCULAR | Status: AC
  Filled 2022-06-13: qty 5

## 2022-06-13 MED ORDER — SUGAMMADEX SODIUM 200 MG/2ML IV SOLN
INTRAVENOUS | Status: DC | PRN
  Administered 2022-06-13: 270.4 mg via INTRAVENOUS

## 2022-06-13 MED ORDER — FENTANYL CITRATE PF 50 MCG/ML IJ SOSY: 25.0000 ug | PREFILLED_SYRINGE | INTRAMUSCULAR | Status: DC | PRN

## 2022-06-13 MED ORDER — 0.9 % SODIUM CHLORIDE (POUR BTL) OPTIME
TOPICAL | Status: DC | PRN
  Administered 2022-06-13: 1000 mL

## 2022-06-13 MED ORDER — EPINEPHRINE PF 1 MG/ML IJ SOLN
INTRAMUSCULAR | Status: AC
  Filled 2022-06-13: qty 1

## 2022-06-13 MED ORDER — GLYCOPYRROLATE PF 0.2 MG/ML IJ SOSY
PREFILLED_SYRINGE | INTRAMUSCULAR | Status: AC
  Filled 2022-06-13: qty 1

## 2022-06-13 MED ORDER — EPINEPHRINE PF 1 MG/ML IJ SOLN
INTRAMUSCULAR | Status: AC
  Filled 2022-06-13: qty 2

## 2022-06-13 MED ORDER — DEXMEDETOMIDINE HCL IN NACL 80 MCG/20ML IV SOLN
INTRAVENOUS | Status: AC
  Filled 2022-06-13: qty 20

## 2022-06-13 MED ORDER — PROPOFOL 10 MG/ML IV BOLUS
INTRAVENOUS | Status: DC | PRN
  Administered 2022-06-13 (×7): 20 mg via INTRAVENOUS
  Administered 2022-06-13: 50 mg via INTRAVENOUS
  Administered 2022-06-13: 20 mg via INTRAVENOUS

## 2022-06-13 MED ORDER — PROPOFOL 10 MG/ML IV BOLUS
INTRAVENOUS | Status: AC
  Filled 2022-06-13: qty 20

## 2022-06-13 MED ORDER — KETAMINE HCL 10 MG/ML IJ SOLN
INTRAMUSCULAR | Status: DC | PRN
  Administered 2022-06-13: 20 mg via INTRAVENOUS
  Administered 2022-06-13: 10 mg via INTRAVENOUS
  Administered 2022-06-13: 20 mg via INTRAVENOUS

## 2022-06-13 MED ORDER — ROCURONIUM BROMIDE 10 MG/ML (PF) SYRINGE
PREFILLED_SYRINGE | INTRAVENOUS | Status: DC | PRN
  Administered 2022-06-13: 30 mg via INTRAVENOUS

## 2022-06-13 MED ORDER — CHLORHEXIDINE GLUCONATE CLOTH 2 % EX PADS
6.0000 | MEDICATED_PAD | Freq: Every day | CUTANEOUS | Status: DC
  Administered 2022-06-13 – 2022-06-14 (×2): 6 via TOPICAL

## 2022-06-13 MED ORDER — DEXAMETHASONE SODIUM PHOSPHATE 10 MG/ML IJ SOLN
INTRAMUSCULAR | Status: AC
  Filled 2022-06-13: qty 1

## 2022-06-13 MED ORDER — MIDAZOLAM HCL 2 MG/2ML IJ SOLN
INTRAMUSCULAR | Status: AC
  Filled 2022-06-13: qty 2

## 2022-06-13 MED ORDER — SUCCINYLCHOLINE CHLORIDE 200 MG/10ML IV SOSY
PREFILLED_SYRINGE | INTRAVENOUS | Status: AC
  Filled 2022-06-13: qty 10

## 2022-06-13 MED ORDER — FENTANYL CITRATE (PF) 250 MCG/5ML IJ SOLN
INTRAMUSCULAR | Status: DC | PRN
  Administered 2022-06-13 (×3): 50 ug via INTRAVENOUS

## 2022-06-13 MED ORDER — PHENYLEPHRINE HCL-NACL 20-0.9 MG/250ML-% IV SOLN
INTRAVENOUS | Status: AC
  Filled 2022-06-13: qty 250

## 2022-06-13 MED ORDER — LIDOCAINE-EPINEPHRINE 1 %-1:100000 IJ SOLN
INTRAMUSCULAR | Status: DC | PRN
  Administered 2022-06-13: 5 mL via INTRADERMAL

## 2022-06-13 MED ORDER — ORAL CARE MOUTH RINSE: 15.0000 mL | Freq: Once | OROMUCOSAL | Status: AC

## 2022-06-13 MED ORDER — ROCURONIUM BROMIDE 10 MG/ML (PF) SYRINGE
PREFILLED_SYRINGE | INTRAVENOUS | Status: AC
  Filled 2022-06-13: qty 10

## 2022-06-13 MED ORDER — PHENYLEPHRINE 80 MCG/ML (10ML) SYRINGE FOR IV PUSH (FOR BLOOD PRESSURE SUPPORT)
PREFILLED_SYRINGE | INTRAVENOUS | Status: AC
  Filled 2022-06-13: qty 10

## 2022-06-13 MED ORDER — CHLORHEXIDINE GLUCONATE 0.12 % MT SOLN
15.0000 mL | Freq: Once | OROMUCOSAL | Status: AC
  Administered 2022-06-13: 15 mL via OROMUCOSAL

## 2022-06-13 MED ORDER — FENTANYL CITRATE (PF) 100 MCG/2ML IJ SOLN: 25.0000 ug | INTRAMUSCULAR | Status: DC | PRN

## 2022-06-13 MED ORDER — ONDANSETRON HCL 4 MG/2ML IJ SOLN
INTRAMUSCULAR | Status: AC
  Filled 2022-06-13: qty 2

## 2022-06-13 MED ORDER — FENTANYL CITRATE (PF) 250 MCG/5ML IJ SOLN
INTRAMUSCULAR | Status: AC
  Filled 2022-06-13: qty 5

## 2022-06-13 MED ORDER — DEXMEDETOMIDINE HCL IN NACL 80 MCG/20ML IV SOLN
INTRAVENOUS | Status: DC | PRN
  Administered 2022-06-13 (×4): 4 ug via BUCCAL

## 2022-06-13 MED ORDER — FENTANYL CITRATE PF 50 MCG/ML IJ SOSY
25.0000 ug | PREFILLED_SYRINGE | INTRAMUSCULAR | Status: DC | PRN
  Administered 2022-06-13 – 2022-06-19 (×18): 50 ug via INTRAVENOUS
  Filled 2022-06-13 (×18): qty 1

## 2022-06-13 MED ORDER — LACTATED RINGERS IV SOLN: INTRAVENOUS | Status: DC

## 2022-06-13 SURGICAL SUPPLY — 62 items
BAG COUNTER SPONGE SURGICOUNT (BAG) ×2 IMPLANT
BLADE CLIPPER SURG (BLADE) IMPLANT
BLADE SURG 15 STRL LF DISP TIS (BLADE) IMPLANT
BLADE SURG 15 STRL SS (BLADE)
CANISTER SUCT 3000ML PPV (MISCELLANEOUS) ×2 IMPLANT
CLEANER TIP ELECTROSURG 2X2 (MISCELLANEOUS) ×2 IMPLANT
CLIP TI MEDIUM 24 (CLIP) IMPLANT
CNTNR URN SCR LID CUP LEK RST (MISCELLANEOUS) IMPLANT
CONT SPEC 4OZ STRL OR WHT (MISCELLANEOUS) ×2
CORD BIPOLAR FORCEPS 12FT (ELECTRODE) ×2 IMPLANT
COVER BACK TABLE 60X90IN (DRAPES) ×2 IMPLANT
COVER MAYO STAND STRL (DRAPES) ×2 IMPLANT
COVER SURGICAL LIGHT HANDLE (MISCELLANEOUS) ×2 IMPLANT
DRAPE HALF SHEET 40X57 (DRAPES) ×2 IMPLANT
DRSG TELFA 3X8 NADH STRL (GAUZE/BANDAGES/DRESSINGS) IMPLANT
ELECT COATED BLADE 2.86 ST (ELECTRODE) ×2 IMPLANT
ELECT REM PT RETURN 9FT ADLT (ELECTROSURGICAL) ×2
ELECTRODE REM PT RTRN 9FT ADLT (ELECTROSURGICAL) ×2 IMPLANT
FORCEPS BIPOLAR SPETZLER 8 1.0 (NEUROSURGERY SUPPLIES) IMPLANT
GAUZE 4X4 16PLY ~~LOC~~+RFID DBL (SPONGE) ×2 IMPLANT
GAUZE SPONGE 4X4 12PLY STRL (GAUZE/BANDAGES/DRESSINGS) IMPLANT
GAUZE XEROFORM 5X9 LF (GAUZE/BANDAGES/DRESSINGS) IMPLANT
GLOVE BIO SURGEON STRL SZ7.5 (GLOVE) ×2 IMPLANT
GLOVE BIOGEL PI IND STRL 8 (GLOVE) ×2 IMPLANT
GLOVE ECLIPSE 7.5 STRL STRAW (GLOVE) ×2 IMPLANT
GOWN STRL REUS W/ TWL LRG LVL3 (GOWN DISPOSABLE) ×2 IMPLANT
GOWN STRL REUS W/ TWL XL LVL3 (GOWN DISPOSABLE) ×2 IMPLANT
GOWN STRL REUS W/TWL LRG LVL3 (GOWN DISPOSABLE) ×2
GOWN STRL REUS W/TWL XL LVL3 (GOWN DISPOSABLE) ×2
GUARD TEETH (MISCELLANEOUS) IMPLANT
HOLDER TRACH TUBE VELCRO 19.5 (MISCELLANEOUS) IMPLANT
KIT BASIN OR (CUSTOM PROCEDURE TRAY) ×2 IMPLANT
KIT SUCTION CATH 14FR (SUCTIONS) IMPLANT
KIT TURNOVER KIT B (KITS) ×2 IMPLANT
NDL HYPO 18GX1.5 BLUNT FILL (NEEDLE) IMPLANT
NDL HYPO 25GX1X1/2 BEV (NEEDLE) ×2 IMPLANT
NEEDLE HYPO 18GX1.5 BLUNT FILL (NEEDLE) ×2 IMPLANT
NEEDLE HYPO 25GX1X1/2 BEV (NEEDLE) ×4 IMPLANT
NS IRRIG 1000ML POUR BTL (IV SOLUTION) ×2 IMPLANT
PAD ARMBOARD 7.5X6 YLW CONV (MISCELLANEOUS) ×4 IMPLANT
PATTIES SURGICAL .5 X3 (DISPOSABLE) IMPLANT
PENCIL SMOKE EVACUATOR (MISCELLANEOUS) ×2 IMPLANT
SOL ANTI FOG 6CC (MISCELLANEOUS) IMPLANT
SPONGE DRAIN TRACH 4X4 STRL 2S (GAUZE/BANDAGES/DRESSINGS) ×2 IMPLANT
SPONGE INTESTINAL PEANUT (DISPOSABLE) ×2 IMPLANT
STAPLER VISISTAT 35W (STAPLE) ×2 IMPLANT
SUT CHROMIC 2 0 SH (SUTURE) ×2 IMPLANT
SUT ETHILON 2 0 FS 18 (SUTURE) ×4 IMPLANT
SUT SILK 2 0 PERMA HAND 18 BK (SUTURE) ×2 IMPLANT
SUT SILK 3 0 REEL (SUTURE) ×2 IMPLANT
SUT VIC AB 3-0 SH 27 (SUTURE) ×2
SUT VIC AB 3-0 SH 27X BRD (SUTURE) IMPLANT
SYR 20ML LL LF (SYRINGE) ×2 IMPLANT
SYR 3ML LL SCALE MARK (SYRINGE) IMPLANT
SYR BULB IRRIG 60ML STRL (SYRINGE) IMPLANT
SYR CONTROL 10ML LL (SYRINGE) ×2 IMPLANT
TOWEL GREEN STERILE (TOWEL DISPOSABLE) ×2 IMPLANT
TRAY ENT MC OR (CUSTOM PROCEDURE TRAY) ×2 IMPLANT
TUBE CONNECTING 12X1/4 (SUCTIONS) ×2 IMPLANT
TUBE TRACH  6.0 CUFF FLEX (MISCELLANEOUS) ×2
TUBE TRACH 6.0 CUFF FLEX (MISCELLANEOUS) IMPLANT
WATER STERILE IRR 1000ML POUR (IV SOLUTION) ×2 IMPLANT

## 2022-06-13 NOTE — Progress Notes (Signed)
SLP Cancellation Note  Patient Details Name: Daniel Marshall MRN: [id number] DOB: 27-Sep-1975   Cancelled treatment:       Reason Eval/Treat Not Completed: Medical issues which prohibited therapy. Orders received for PMV and BSE evaluations. Plan to follow up next date as patient had direct laryngoscopy with biopsy and awake tracheostomy earlier today.    Sonia Baller, MA, CCC-SLP Speech Therapy

## 2022-06-13 NOTE — Anesthesia Procedure Notes (Signed)
Procedure Name: MAC Date/Time: 06/13/2022 7:50 AM  Performed by: Anastasio Auerbach, CRNAPre-anesthesia Checklist: Emergency Drugs available, Suction available and Patient being monitored Induction Type: IV induction

## 2022-06-13 NOTE — H&P (Signed)
Daniel Marshall is an 46 y.o. male.    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 No interval changes since admission yesterday.   Past Medical History:  Diagnosis Date   Asthma    as a child   Broken ribs    Dyspnea    Hypertension     Past Surgical History:  Procedure Laterality Date   NO PAST SURGERIES      Family History  Family history unknown: Yes    Social History:  reports that he has been smoking cigarettes. He has never used smokeless tobacco. He reports current alcohol use. He reports current drug use. Drug: Marijuana.  Allergies: No Known Allergies  Medications Prior to Admission  Medication Sig Dispense Refill   ibuprofen (ADVIL) 200 MG tablet Take 200-600 mg by mouth every 6 (six) hours as needed for fever or headache (pain).     Phenylephrine-DM-GG (MUCINEX FAST-MAX CONGEST COUGH PO) Take 2 tablets by mouth every 6 (six) hours as needed (cough). (Patient not taking: Reported on 06/11/2022)      Results for orders placed or performed during the hospital encounter of 06/11/22 (from the past 48 hour(s))  CBC with Differential     Status: Abnormal   Collection Time: 06/11/22  1:48 PM  Result Value Ref Range   WBC 10.6 (H) 4.0 - 10.5 K/uL   RBC 4.78 4.22 - 5.81 MIL/uL   Hemoglobin 14.2 13.0 - 17.0 g/dL   HCT 41.8 39.0 - 52.0 %   MCV 87.4 80.0 - 100.0 fL   MCH 29.7 26.0 - 34.0 pg   MCHC 34.0 30.0 - 36.0 g/dL   RDW 14.8 11.5 - 15.5 %   Platelets 230 150 - 400 K/uL   nRBC 0.0 0.0 - 0.2 %   Neutrophils Relative % 76 %   Neutro Abs 8.1 (H) 1.7 - 7.7 K/uL   Lymphocytes Relative 14 %   Lymphs Abs 1.5 0.7 - 4.0 K/uL   Monocytes Relative 9 %   Monocytes Absolute 0.9 0.1 - 1.0 K/uL   Eosinophils Relative 0 %   Eosinophils Absolute 0.0 0.0 - 0.5 K/uL   Basophils Relative 0 %   Basophils Absolute 0.0 0.0 - 0.1 K/uL   Immature  Granulocytes 1 %   Abs Immature Granulocytes 0.05 0.00 - 0.07 K/uL    Comment: Performed at [facility], [address] [place], [place] [address]  Comprehensive metabolic panel     Status: Abnormal   Collection Time: 06/11/22  1:48 PM  Result Value Ref Range   Sodium 131 (L) 135 - 145 mmol/L   Potassium 3.8 3.5 - 5.1 mmol/L   Chloride 94 (L) 98 - 111 mmol/L   CO2 27 22 - 32 mmol/L   Glucose, Bld 86 70 - 99 mg/dL    Comment: Glucose reference range applies only to samples taken after fasting for at least 8 hours.   BUN 7 6 - 20 mg/dL   Creatinine, Ser 0.60 (L) 0.61 - 1.24 mg/dL   Calcium 8.7 (L) 8.9 - 10.3 mg/dL   Total Protein 7.8 6.5 - 8.1 g/dL   Albumin 3.5 3.5 - 5.0 g/dL   AST 67 (H) 15 - 41 U/L   ALT 31 0 - 44 U/L   Alkaline Phosphatase 74 38 - 126 U/L   Total Bilirubin 0.6 0.3 - 1.2 mg/dL  GFR, Estimated >60 >60 mL/min    Comment: (NOTE) Calculated using the CKD-EPI Creatinine Equation (2021)    Anion gap 10 5 - 15    Comment: Performed at [facility], [place] [address] [place] [address]  CBC     Status: None   Collection Time: 06/12/22  5:08 AM  Result Value Ref Range   WBC 6.9 4.0 - 10.5 K/uL   RBC 4.69 4.22 - 5.81 MIL/uL   Hemoglobin 14.1 13.0 - 17.0 g/dL   HCT 40.1 39.0 - 52.0 %   MCV 85.5 80.0 - 100.0 fL   MCH 30.1 26.0 - 34.0 pg   MCHC 35.2 30.0 - 36.0 g/dL   RDW 15.0 11.5 - 15.5 %   Platelets 217 150 - 400 K/uL   nRBC 0.0 0.0 - 0.2 %    Comment: Performed at [facility], [place] [address], [place], [place] [address]   DG Chest 2 View  Result Date: 06/11/2022 CLINICAL DATA:  Shortness of breath EXAM: CHEST - 2 VIEW COMPARISON:  06/10/2022 FINDINGS: The heart size and mediastinal contours are within normal limits. Both lungs are clear. The visualized skeletal structures are unremarkable. IMPRESSION: No active cardiopulmonary disease. Electronically Signed   By: Davina Poke D.O.   On: 06/11/2022 13:50    ROS: negative other than  stated in HPI  Blood pressure 138/89, pulse 89, temperature 98 F (36.7 C), temperature source Oral, resp. rate 18, height '5\' 6"'$  (1.676 m), weight 67.6 kg, SpO2 93 %.  PHYSICAL EXAM: General: Resting comfortably in NAD  Lungs: Non-labored respiratinos  Studies Reviewed: CT Neck    Assessment/Plan Larynx cancer Stridor Dysphonia.  Proceed with awake tracheostomy, DL with biopsies. Discussed airway management with Anesthesia Attending Dr. Gloris Manchester. Patient informed consent completed.  Risk discussed including pain, bleeding, nerve infection, loss of airway, injury to the esophagus, need for further surgery, risk of anesthesia including including heart attack, stroke, death and disability   Anticipate transfer to ICU per hospital policy; notified primary hospitalist Dr. Sloan Leiter.  Electronically signed by:  Jenetta Downer, MD  Staff Physician Facial Plastic & Reconstructive Surgery Otolaryngology - Head and Neck Surgery [place]     Electronically signed by:  Jenetta Downer, MD  Staff Physician Facial Plastic & Reconstructive Surgery Otolaryngology - Head and Neck Surgery [place]  06/13/2022, 7:40 AM

## 2022-06-13 NOTE — Progress Notes (Addendum)
PROGRESS NOTE        PATIENT DETAILS Name: Daniel Marshall Age: 46 y.o. Sex: male Date of Birth: 04/29/1976 Admit Date: 06/11/2022 Admitting Physician Evalee Mutton Kristeen Mans, MD PCP:Pcp, No  Brief Summary: Patient is a 46 y.o.  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  Significant events: 11/15>> admit to [facility] to have a laryngeal mass-signed out AMA. 11/16>> presented to [facility]-admit to [facility].  Significant studies: 11/15>> CT soft tissue neck: Asymmetric thickening of the right aryepiglottic fold, asymmetric enlargement of the right laryngeal ventricle and pyriform sinus.  Significant microbiology data: None  Procedures:   Consults: ENT  Subjective: No major issues overnight.  Objective: Vitals: Blood pressure 138/75, pulse 91, temperature 99.2 F (37.3 C), resp. rate 15, height '5\' 6"'$  (1.676 m), weight 67.6 kg, SpO2 98 %.   Exam: Gen Exam:Alert awake-not in any distress HEENT:atraumatic, normocephalic Chest: B/L clear to auscultation anteriorly CVS:S1S2 regular Abdomen:soft non tender, non distended Extremities:no edema Neurology: Non focal Skin: no rash  Pertinent Labs/Radiology:    Latest Ref Rng & Units 06/12/2022    5:08 AM 06/11/2022    1:48 PM 06/10/2022    1:25 AM  CBC  WBC 4.0 - 10.5 K/uL 6.9  10.6  8.7   Hemoglobin 13.0 - 17.0 g/dL 14.1  14.2  14.8   Hematocrit 39.0 - 52.0 % 40.1  41.8  44.0   Platelets 150 - 400 K/uL 217  230  190     Lab Results  Component Value Date   NA 131 (L) 06/11/2022   K 3.8 06/11/2022   CL 94 (L) 06/11/2022   CO2 27 06/11/2022      Assessment/Plan: Laryngeal mass ENT planning tracheostomy/biopsy today. Per ENT-we will need to go to the ICU from the OR given the fresh tracheotomy.  COPD Not in  exacerbation Continue bronchodilators  Tobacco abuse Counseled.   BMI: Estimated body mass index is 24.05 kg/m as calculated from the following:   Height as of this encounter: '5\' 6"'$  (1.676 m).   Weight as of this encounter: 67.6 kg.   Code status:   Code Status: Full Code   DVT Prophylaxis: SCDs Start: 06/11/22 2348     Family Communication: None at bedside   Disposition Plan: Status is: Inpatient Remains inpatient appropriate because: Laryngeal mass-possible need for biopsy/tracheotomy.   Planned Discharge Destination:Home sometime next week.   Diet: Diet Order             Diet NPO time specified  Diet effective now                     Antimicrobial agents: Anti-infectives (From admission, onward)    None        MEDICATIONS: Scheduled Meds:  Chlorhexidine Gluconate Cloth  6 each Topical Daily   [START ON 06/14/2022] losartan  50 mg Oral Daily   Continuous Infusions: PRN Meds:.acetaminophen, fentaNYL (SUBLIMAZE) injection, ipratropium-albuterol, ondansetron (ZOFRAN) IV   I have personally reviewed following labs and imaging studies  LABORATORY DATA: CBC: Recent Labs  Lab 06/10/22 0125 06/11/22 1348 06/12/22 0508  WBC 8.7 10.6* 6.9  NEUTROABS 5.8 8.1*  --   HGB 14.8 14.2 14.1  HCT 44.0 41.8 40.1  MCV 87.6 87.4 85.5  PLT 190 230 217     Basic Metabolic Panel: Recent Labs  Lab 06/10/22 0125 06/11/22 1348  NA 131* 131*  K 3.5 3.8  CL 91* 94*  CO2 26 27  GLUCOSE 118* 86  BUN 10 7  CREATININE 0.74 0.60*  CALCIUM 9.3 8.7*     GFR: Estimated Creatinine Clearance: 103 mL/min (A) (by C-G formula based on SCr of 0.6 mg/dL (L)).  Liver Function Tests: Recent Labs  Lab 06/11/22 1348  AST 67*  ALT 31  ALKPHOS 74  BILITOT 0.6  PROT 7.8  ALBUMIN 3.5    No results for input(s): "LIPASE", "AMYLASE" in the last 168 hours. No results for input(s): "AMMONIA" in the last 168 hours.  Coagulation Profile: No results for  input(s): "INR", "PROTIME" in the last 168 hours.  Cardiac Enzymes: No results for input(s): "CKTOTAL", "CKMB", "CKMBINDEX", "TROPONINI" in the last 168 hours.  BNP (last 3 results) No results for input(s): "PROBNP" in the last 8760 hours.  Lipid Profile: No results for input(s): "CHOL", "HDL", "LDLCALC", "TRIG", "CHOLHDL", "LDLDIRECT" in the last 72 hours.  Thyroid Function Tests: No results for input(s): "TSH", "T4TOTAL", "FREET4", "T3FREE", "THYROIDAB" in the last 72 hours.  Anemia Panel: No results for input(s): "VITAMINB12", "FOLATE", "FERRITIN", "TIBC", "IRON", "RETICCTPCT" in the last 72 hours.  Urine analysis:    Component Value Date/Time   COLORURINE YELLOW 05/19/2014 1805   APPEARANCEUR CLEAR 05/19/2014 1805   LABSPEC 1.015 08/11/2017 1559   PHURINE 6.0 08/11/2017 1559   GLUCOSEU NEGATIVE 08/11/2017 1559   HGBUR NEGATIVE 08/11/2017 1559   BILIRUBINUR NEGATIVE 08/11/2017 1559   KETONESUR NEGATIVE 08/11/2017 1559   PROTEINUR NEGATIVE 08/11/2017 1559   UROBILINOGEN 1.0 08/11/2017 1559   NITRITE NEGATIVE 08/11/2017 1559   LEUKOCYTESUR NEGATIVE 08/11/2017 1559    Sepsis Labs: Lactic Acid, Venous No results found for: "LATICACIDVEN"  MICROBIOLOGY: Recent Results (from the past 240 hour(s))  Resp Panel by RT-PCR (Flu A&B, Covid) Anterior Nasal Swab     Status: None   Collection Time: 06/10/22  1:36 AM   Specimen: Anterior Nasal Swab  Result Value Ref Range Status   SARS Coronavirus 2 by RT PCR NEGATIVE NEGATIVE Final    Comment: (NOTE) SARS-CoV-2 target nucleic acids are NOT DETECTED.  The SARS-CoV-2 RNA is generally detectable in upper respiratory specimens during the acute phase of infection. The lowest concentration of SARS-CoV-2 viral copies this assay can detect is 138 copies/mL. A negative result does not preclude SARS-Cov-2 infection and should not be used as the sole basis for treatment or other patient management decisions. A negative result may occur  with  improper specimen collection/handling, submission of specimen other than nasopharyngeal swab, presence of viral mutation(s) within the areas targeted by this assay, and inadequate number of viral copies(<138 copies/mL). A negative result must be combined with clinical observations, patient history, and epidemiological information. The expected result is Negative.  Fact Sheet for Patients:  EntrepreneurPulse.com.au  Fact Sheet for Healthcare Providers:  IncredibleEmployment.be  This test is no t yet approved or cleared by the [place] FDA and  has been authorized for detection and/or diagnosis of SARS-CoV-2 by FDA under an Emergency Use Authorization (EUA). This EUA will remain  in effect (meaning this test can be used) for the duration of the COVID-19 declaration under Section 564(b)(1) of the Act, 21 U.S.C.section 360bbb-3(b)(1), unless the authorization is terminated  or revoked sooner.  Influenza A by PCR NEGATIVE NEGATIVE Final   Influenza B by PCR NEGATIVE NEGATIVE Final    Comment: (NOTE) The Xpert Xpress SARS-CoV-2/FLU/RSV plus assay is intended as an aid in the diagnosis of influenza from Nasopharyngeal swab specimens and should not be used as a sole basis for treatment. Nasal washings and aspirates are unacceptable for Xpert Xpress SARS-CoV-2/FLU/RSV testing.  Fact Sheet for Patients: EntrepreneurPulse.com.au  Fact Sheet for Healthcare Providers: IncredibleEmployment.be  This test is not yet approved or cleared by the [place] FDA and has been authorized for detection and/or diagnosis of SARS-CoV-2 by FDA under an Emergency Use Authorization (EUA). This EUA will remain in effect (meaning this test can be used) for the duration of the COVID-19 declaration under Section 564(b)(1) of the Act, 21 U.S.C. section 360bbb-3(b)(1), unless the authorization is terminated  or revoked.  Performed at [facility], [place] [address], [place], [place] [address]   MRSA Next Gen by PCR, Nasal     Status: None   Collection Time: 06/10/22  5:08 AM   Specimen: Nasal Mucosa; Nasal Swab  Result Value Ref Range Status   MRSA by PCR Next Gen NOT DETECTED NOT DETECTED Final    Comment: (NOTE) The GeneXpert MRSA Assay (FDA approved for NASAL specimens only), is one component of a comprehensive MRSA colonization surveillance program. It is not intended to diagnose MRSA infection nor to guide or monitor treatment for MRSA infections. Test performance is not FDA approved in patients less than 42 years old. Performed at [facility], [place] [address], [place], [place] [address]   Respiratory (~20 pathogens) panel by PCR     Status: None   Collection Time: 06/10/22 11:59 AM   Specimen: Nasopharyngeal Swab; Respiratory  Result Value Ref Range Status   Adenovirus NOT DETECTED NOT DETECTED Final   Coronavirus 229E NOT DETECTED NOT DETECTED Final    Comment: (NOTE) The Coronavirus on the Respiratory Panel, DOES NOT test for the novel  Coronavirus (2019 nCoV)    Coronavirus HKU1 NOT DETECTED NOT DETECTED Final   Coronavirus NL63 NOT DETECTED NOT DETECTED Final   Coronavirus OC43 NOT DETECTED NOT DETECTED Final   Metapneumovirus NOT DETECTED NOT DETECTED Final   Rhinovirus / Enterovirus NOT DETECTED NOT DETECTED Final   Influenza A NOT DETECTED NOT DETECTED Final   Influenza B NOT DETECTED NOT DETECTED Final   Parainfluenza Virus 1 NOT DETECTED NOT DETECTED Final   Parainfluenza Virus 2 NOT DETECTED NOT DETECTED Final   Parainfluenza Virus 3 NOT DETECTED NOT DETECTED Final   Parainfluenza Virus 4 NOT DETECTED NOT DETECTED Final   Respiratory Syncytial Virus NOT DETECTED NOT DETECTED Final   Bordetella pertussis NOT DETECTED NOT DETECTED Final   Bordetella Parapertussis NOT DETECTED NOT DETECTED Final   Chlamydophila pneumoniae NOT  DETECTED NOT DETECTED Final   Mycoplasma pneumoniae NOT DETECTED NOT DETECTED Final    Comment: Performed at [facility], [place]. [address] [place],  [address]    RADIOLOGY STUDIES/RESULTS: DG Chest Port 1 View  Result Date: 06/13/2022 CLINICAL DATA:  Status post tracheostomy EXAM: PORTABLE CHEST 1 VIEW COMPARISON:  06/11/2022 FINDINGS: Tracheostomy tube is noted with tip 5.5 cm above the carina. The cardiomediastinal silhouette is unremarkable. There is no evidence of focal airspace disease, pulmonary edema, suspicious pulmonary nodule/mass, pleural effusion, or pneumothorax. No acute bony abnormalities are identified. Remote rib fractures again identified. IMPRESSION: Tracheostomy tube with tip 5.5 cm above the carina. No significant abnormalities. Electronically  Signed   By: Margarette Canada M.D.   On: 06/13/2022 12:03   DG Chest 2 View  Result Date: 06/11/2022 CLINICAL DATA:  Shortness of breath EXAM: CHEST - 2 VIEW COMPARISON:  06/10/2022 FINDINGS: The heart size and mediastinal contours are within normal limits. Both lungs are clear. The visualized skeletal structures are unremarkable. IMPRESSION: No active cardiopulmonary disease. Electronically Signed   By: Davina Poke D.O.   On: 06/11/2022 13:50     LOS: 2 days   Oren Binet, MD  [facility]    To contact the attending provider between 7A-7P or the covering provider during after hours 7P-7A, please log into the web site [email or web address] and access using universal [place] password for that web site. If you do not have the password, please call the hospital operator.  06/13/2022, 12:43 PM

## 2022-06-13 NOTE — Anesthesia Preprocedure Evaluation (Addendum)
Anesthesia Evaluation  Patient identified by MRN, date of birth, ID band Patient awake    Reviewed: Allergy & Precautions, NPO status , Patient's Chart, lab work & pertinent test results  Airway Mallampati: II  TM Distance: >3 FB Neck ROM: Full    Dental no notable dental hx.    Pulmonary asthma , COPD, Current Smoker Laryngeal Ca    + decreased breath sounds      Cardiovascular hypertension,  Rhythm:Regular Rate:Normal     Neuro/Psych negative neurological ROS  negative psych ROS   GI/Hepatic negative GI ROS,,,(+)     substance abuse  alcohol use and marijuana use  Endo/Other  negative endocrine ROS    Renal/GU negative Renal ROS  negative genitourinary   Musculoskeletal negative musculoskeletal ROS (+)    Abdominal Normal abdominal exam  (+)   Peds  Hematology negative hematology ROS (+)   Anesthesia Other Findings   Reproductive/Obstetrics                             Anesthesia Physical Anesthesia Plan  ASA: 3  Anesthesia Plan: MAC   Post-op Pain Management: Ketamine IV*   Induction: Intravenous  PONV Risk Score and Plan: 1 and Ondansetron, Dexamethasone, Midazolam and Treatment may vary due to age or medical condition  Airway Management Planned: Mask, Tracheostomy and Simple Face Mask  Additional Equipment: None  Intra-op Plan:   Post-operative Plan: Possible Post-op intubation/ventilation  Informed Consent: I have reviewed the patients History and Physical, chart, labs and discussed the procedure including the risks, benefits and alternatives for the proposed anesthesia with the patient or authorized representative who has indicated his/her understanding and acceptance.     Dental advisory given  Plan Discussed with: CRNA  Anesthesia Plan Comments: (- plan for awake trach. Ketamine, Precedex   Lab Results      Component                Value               Date                       WBC                      6.9                 06/12/2022                HGB                      14.1                06/12/2022                HCT                      40.1                06/12/2022                MCV                      85.5                06/12/2022                PLT  217                 06/12/2022            Lab Results      Component                Value               Date                      NA                       131 (L)             06/11/2022                K                        3.8                 06/11/2022                CO2                      27                  06/11/2022                GLUCOSE                  86                  06/11/2022                BUN                      7                   06/11/2022                CREATININE               0.60 (L)            06/11/2022                CALCIUM                  8.7 (L)             06/11/2022                GFRNONAA                 >60                 06/11/2022           )       Anesthesia Quick Evaluation

## 2022-06-13 NOTE — Anesthesia Procedure Notes (Signed)
Date/Time: 06/13/2022 8:30 AM  Performed by: Anastasio Auerbach, CRNAOxygen Delivery Method: Circle system utilized Induction Type: Inhalational induction and Tracheostomy

## 2022-06-13 NOTE — Anesthesia Postprocedure Evaluation (Signed)
Anesthesia Post Note  Patient: Jeevan L Basso  Procedure(s) Performed: DIRECT LARYNGOSCOPY WITH BIOPSY (Neck) AWAKE TRACHEOSTOMY     Patient location during evaluation: PACU Anesthesia Type: General Level of consciousness: awake and alert Pain management: pain level controlled Vital Signs Assessment: post-procedure vital signs reviewed and stable Respiratory status: spontaneous breathing, nonlabored ventilation, respiratory function stable and patient connected to tracheostomy mask oxygen Cardiovascular status: blood pressure returned to baseline and stable Postop Assessment: no apparent nausea or vomiting Anesthetic complications: no   No notable events documented.  Last Vitals:  Vitals:   06/13/22 1010 06/13/22 1100  BP:  138/75  Pulse: 99 (!) 103  Resp: 20 13  Temp:    SpO2: 100% 98%    Last Pain:  Vitals:   06/13/22 0953  TempSrc:   PainSc: 0-No pain                 Belenda Cruise P Mystic Labo

## 2022-06-13 NOTE — Op Note (Signed)
OPERATIVE NOTE  Daniel Marshall Date/Time of Admission: 06/11/2022  1:08 PM  CSN: [id number];MWN:[id number] Attending Provider: Jonetta Osgood, MD Room/Bed: MCPO/NONE DOB: 1975/10/17 Age: 46 y.o.   Pre-Op Diagnosis: LARYNGEAL MASS  Post-Op Diagnosis: LARYNGEAL MASS  Procedure: Procedure(s): DIRECT LARYNGOSCOPY WITH BIOPSY AWAKE TRACHEOSTOMY  Anesthesia: Monitor Anesthesia Care  Surgeon(s): Pamala Hurry, MD  Staff: Circulator: Ellender Hose, RN Scrub Person: Tereasa Coop, RN; Josem Kaufmann, RN Circulator Assistant: Natalia Leatherwood, RN  Implants: * No implants in log *  Specimens: ID Type Source Tests Collected by Time Destination  1 : RIGHT LARYNGEAL TUMOR Tissue PATH Other SURGICAL PATHOLOGY Jenetta Downer, MD 40/[id number]/[id number]     Complications: none  EBL: 20 ML  IVF: See anesthesia report  Condition: stable  Operative Findings:  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 He presents today for surgical airway and biopsies. Informed consent obtained. R/B/A discussed.   Description of Operation: Operative consent was obtained and the site and surgery were confirmed with the patient and the neck landmarks were marked in pre-op area and neck anesthetized with 1% lidocaine 1:100k epi. The operating room team brought the patient back to the operating room. An awake tracheostomy was planned due to laryngeal tumor.   Once the patient was properly sedated, the patient was turned over to the ENT service. A 2 cm incision was planned horizontally in the neck 2 cm above the sternal notch in the midline. The patient was prepped and draped in clean fashion. A  #15 blade scalpel was then used to incise the skin and subcutaneous tissue along the planned incision. The subcutaneous tissue and strap musculature were divided through the midline raphae with Bovie electrocautery. The thyroid isthmus was identified and also divided with Bovie electrocautery.  Education officer, museum were used to retract the soft tissues and then the patient's anterior trachea was identified and cleaned with a [place]. The second tracheal ring was identified.  Meticulous hemostasis was achieved using bipolar cautery.  A 15 blade scalpel was used to incise the space between the second and third tracheal ring and the airway was suctioned free of any mucus and blood. A bjork flap was created making lateral tracheal cuts one tracheal ring In height, however due to difficult exposure a bjork stitch was not placed. The patient was coughing and swallowing frequently during the procedure.  A 6-0 Shiley tracheostomy tube was inserted without difficulty and the patient's airway was stabilized.  The anesthesia circuit was then connected and good gas exchange was confirmed. The tracheostomy tube was sutured in place with 2-0 Silk sutures and a trach tie. Once again, the surgical site was inspected for any active bleeding, and hemostasis was confirmed.   Next we proceeded with direct laryngoscopy with biopsies.  The patient was turned 9 degrees from [place].  A dental guard was placed and direct laryngoscopy was performed with a Dedo laryngoscope.  A full examination of the larynx and hypopharynx was performed.  The examination demonstrated the right-sided glottic/supraglottic tumor.  Cup forceps were used to take multiple biopsies of the tumor and sent for permanent pathology.  Hemostasis was confirmed with 1 1000 epinephrine pledgets.  The Dedo laryngoscope was removed.  Patient was returned to the care of anesthesia and was transferred from the operating room to in stable condition.   Plan: - Medicine team  to transfer to MICU team - Post-op CXR - Post-op CT chest with contrast for staging - F/u biopsies - Tracheostomy tube exchange POD#5 06/18/22   Pamala Hurry, MD [facility]  06/13/2022

## 2022-06-13 NOTE — Consult Note (Signed)
NAME:  Daniel Marshall, MRN:  [id number], DOB:  Nov 27, 1975, LOS: 2 ADMISSION DATE:  06/11/2022, CONSULTATION DATE:  11/18 REFERRING MD:  Sloan Leiter, CHIEF COMPLAINT:  Dyspnea   History of Present Illness:  46 y/o male smoker presented to [facility] on 11/15 with cough and dyspnea for several days.  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  Admitted to [facility]  ENT was consulted and recommended elective tracheostomy but the patient left AMA. He presented to the [facility] ER on 11/16 and requested admission.  He was admitted to the progressive care unit by the hospitalist team.  ENT saw him on 11/17 and made arrangements for an elective tracheostomy which was performed today along with laryngeal mass biopsies. He is complaining of surgical site paine.  PCCM has been asked to monitor him in the ICU overnight.    Pertinent  Medical History  Tobacco abuse Asthma Hypertension  Significant Hospital Events: Including procedures, antibiotic start and stop dates in addition to other pertinent events   11/15 admitted with suspected AE COPD 11/16 admission to [facility] 11/18 elective direct laryngoscopy with biopsy, awake tracheostomy  Interim History / Subjective:     Objective   Blood pressure 138/75, pulse (!) 103, temperature 99.2 F (37.3 C), resp. rate 13, height '5\' 6"'$  (1.676 m), weight 67.6 kg, SpO2 98 %.    FiO2 (%):  [40 %] 40 %   Intake/Output Summary (Last 24 hours) at 06/13/2022 1141 Last data filed at 06/13/2022 0857 Gross per 24 hour  Intake --  Output 25 ml  Net -25 ml   Filed Weights   06/11/22 1315  Weight: 67.6 kg    Examination:  General:  Resting comfortably in bed HENT: NCAT OP clear, new tracheostomy tube with mild oozing PULM: CTA B, normal effort CV: RRR, no mgr GI: BS+, soft, nontender MSK: normal bulk and tone Neuro: awake, alert, no distress, MAEW   Resolved Hospital Problem list     Assessment & Plan:  Laryngeal mass S/p  tracheostomy Smoker Hypertension Hyponatremia  Discussion: Doing well after elective tracheostomy.  Plan: Monitor in ICU setting overnight Trach care per routine CXR now SLP evaluation Prn fentanyl Start losartan Will need CT chest soon, will hold off for today PT/OT for mobility  Could go back to floor tomorrow  Best Practice (right click and "Reselect all SmartList Selections" daily)   Per primary service  Labs   CBC: Recent Labs  Lab 06/10/22 0125 06/11/22 1348 06/12/22 0508  WBC 8.7 10.6* 6.9  NEUTROABS 5.8 8.1*  --   HGB 14.8 14.2 14.1  HCT 44.0 41.8 40.1  MCV 87.6 87.4 85.5  PLT 190 230 619    Basic Metabolic Panel: Recent Labs  Lab 06/10/22 0125 06/11/22 1348  NA 131* 131*  K 3.5 3.8  CL 91* 94*  CO2 26 27  GLUCOSE 118* 86  BUN 10 7  CREATININE 0.74 0.60*  CALCIUM 9.3 8.7*   GFR: Estimated Creatinine Clearance: 103 mL/min (A) (by C-G formula based on SCr of 0.6 mg/dL (L)). Recent Labs  Lab 06/10/22 0125 06/11/22 1348 06/12/22 0508  WBC 8.7 10.6* 6.9    Liver Function Tests: Recent Labs  Lab 06/11/22 1348  AST 67*  ALT 31  ALKPHOS 74  BILITOT 0.6  PROT 7.8  ALBUMIN 3.5   No results for input(s): "LIPASE", "AMYLASE" in the last 168 hours. No results for input(s): "AMMONIA" in the last 168 hours.  ABG    Component Value Date/Time  HCO3 29.3 (H) 06/10/2022 0132   TCO2 25 03/07/2018 2005   O2SAT 81.8 06/10/2022 0132     Coagulation Profile: No results for input(s): "INR", "PROTIME" in the last 168 hours.  Cardiac Enzymes: No results for input(s): "CKTOTAL", "CKMB", "CKMBINDEX", "TROPONINI" in the last 168 hours.  HbA1C: Hgb A1c MFr Bld  Date/Time Value Ref Range Status  05/05/2017 02:45 PM 5.0 <5.7 % of total Hgb Final    Comment:    For the purpose of screening for the presence of diabetes: . <5.7%       Consistent with the absence of diabetes 5.7-6.4%    Consistent with increased risk for diabetes              (prediabetes) > or =6.5%  Consistent with diabetes . This assay result is consistent with a decreased risk of diabetes. . Currently, no consensus exists regarding use of hemoglobin A1c for diagnosis of diabetes in children. . According to American Diabetes Association (ADA) guidelines, hemoglobin A1c <7.0% represents optimal control in non-pregnant diabetic patients. Different metrics may apply to specific patient populations.  Standards of Medical Care in Diabetes(ADA). .     CBG: No results for input(s): "GLUCAP" in the last 168 hours.  Review of Systems:   Gen: Denies fever, chills, weight change, fatigue, night sweats HEENT: Denies blurred vision, double vision, hearing loss, tinnitus, sinus congestion, rhinorrhea, sore throat, neck stiffness, dysphagia PULM: per HPI CV: Denies chest pain, edema, orthopnea, paroxysmal nocturnal dyspnea, palpitations GI: Denies abdominal pain, nausea, vomiting, diarrhea, hematochezia, melena, constipation, change in bowel habits GU: Denies dysuria, hematuria, polyuria, oliguria, urethral discharge Endocrine: Denies hot or cold intolerance, polyuria, polyphagia or appetite change Derm: Denies rash, dry skin, scaling or peeling skin change Heme: Denies easy bruising, bleeding, bleeding gums Neuro: Denies headache, numbness, weakness, slurred speech, loss of memory or consciousness   Past Medical History:  He,  has a past medical history of Asthma, Broken ribs, Dyspnea, and Hypertension.   Surgical History:   Past Surgical History:  Procedure Laterality Date   NO PAST SURGERIES       Social History:   reports that he has been smoking cigarettes. He has never used smokeless tobacco. He reports current alcohol use. He reports current drug use. Drug: Marijuana.   Family History:  His Family history is unknown by patient.   Allergies No Known Allergies   Home Medications  Prior to Admission medications   Medication Sig Start Date  End Date Taking? Authorizing Provider  ibuprofen (ADVIL) 200 MG tablet Take 200-600 mg by mouth every 6 (six) hours as needed for fever or headache (pain).   Yes [provider]  Phenylephrine-DM-GG (MUCINEX FAST-MAX CONGEST COUGH PO) Take 2 tablets by mouth every 6 (six) hours as needed (cough). Patient not taking: Reported on 06/11/2022    [provider]     Critical care time: n/a    Roselie Awkward, MD [place] [facility] Pager: [phone] Cell: [phone] After 7:00 pm call Elink  [phone]

## 2022-06-13 NOTE — Transfer of Care (Signed)
Immediate Anesthesia Transfer of Care Note  Patient: Daniel Marshall  Procedure(s) Performed: DIRECT LARYNGOSCOPY WITH BIOPSY (Neck) AWAKE TRACHEOSTOMY  Patient Location: PACU  Anesthesia Type:MAC and General  Level of Consciousness: awake, alert , and oriented  Airway & Oxygen Therapy: Patient Spontanous Breathing and Patient connected to T-piece oxygen  Post-op Assessment: Report given to RN and Post -op Vital signs reviewed and stable  Post vital signs: Reviewed and stable  Last Vitals:  Vitals Value Taken Time  BP 155/105 06/13/22 0908  Temp    Pulse 116 06/13/22 0912  Resp 20 06/13/22 0912  SpO2 100 % 06/13/22 0912  Vitals shown include unvalidated device data.  Last Pain:  Vitals:   06/13/22 0731  TempSrc: Oral  PainSc:          Complications: No notable events documented.

## 2022-06-14 ENCOUNTER — Encounter (HOSPITAL_COMMUNITY): Payer: Self-pay | Admitting: Otolaryngology

## 2022-06-14 ENCOUNTER — Inpatient Hospital Stay (HOSPITAL_COMMUNITY): Payer: Medicaid Other

## 2022-06-14 LAB — GLUCOSE, CAPILLARY: Glucose-Capillary: 91 mg/dL (ref 70–99)

## 2022-06-14 MED ORDER — GUAIFENESIN 100 MG/5ML PO LIQD: 5.0000 mL | ORAL | Status: DC | PRN

## 2022-06-14 MED ORDER — IOHEXOL 350 MG/ML SOLN
50.0000 mL | Freq: Once | INTRAVENOUS | Status: AC | PRN
  Administered 2022-06-14: 50 mL via INTRAVENOUS

## 2022-06-14 MED ORDER — HYDRALAZINE HCL 20 MG/ML IJ SOLN: 10.0000 mg | INTRAMUSCULAR | Status: DC | PRN

## 2022-06-14 MED ORDER — METOPROLOL TARTRATE 5 MG/5ML IV SOLN: 5.0000 mg | INTRAVENOUS | Status: DC | PRN

## 2022-06-14 MED ORDER — SENNOSIDES-DOCUSATE SODIUM 8.6-50 MG PO TABS: 1.0000 | ORAL_TABLET | Freq: Every evening | ORAL | Status: DC | PRN

## 2022-06-14 NOTE — Evaluation (Signed)
Passy-Muir Speaking Valve - Evaluation Patient Details  Name: Daniel Marshall MRN: [id number] Date of Birth: [birth year]  Today's Date: 06/14/2022 Time: 0914-0950 SLP Time Calculation (min) (ACUTE ONLY): 36 min  Past Medical History:  Past Medical History:  Diagnosis Date   Asthma    as a child   Broken ribs    Dyspnea    Hypertension    Past Surgical History:  Past Surgical History:  Procedure Laterality Date   DIRECT LARYNGOSCOPY N/A 06/13/2022   Procedure: DIRECT LARYNGOSCOPY WITH BIOPSY;  Surgeon: Jenetta Downer, MD;  Location: [place];  Service: ENT;  Laterality: N/A;   NO PAST SURGERIES     TRACHEOSTOMY TUBE PLACEMENT N/A 06/13/2022   Procedure: AWAKE TRACHEOSTOMY;  Surgeon: Jenetta Downer, MD;  Location: [place];  Service: ENT;  Laterality: N/A;   HPI:  Pt is a 46 yo male who presented to [facility] 11/15 with several days of cough and dyspnea. 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 Pt returned to [facility] ED on 11/16 and 11/18 pt underwent elective trach and laryngeal mass biopsies. CT showed asymmetric thickening of the right aryepiglottic fold and asymmetric enlargement of the right laryngeal ventricle and piriform sinus. This has progressed in the interval. 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 PMH includes: asthma, broken ribs, dyspnea, HTN    Assessment / Plan / Recommendation  Clinical Impression  Pt's cuff was deflated and PMV was placed with no evidence to suggest intolerance. PMV was donned for 25 minutes, and at the end of trial, pt was trained on the placement and removal of PMV. Emphasis was placed on ensuring the cuff was fully deflated (and calling for staff if there is any residual air at all), and removal of PMV for sleeping or with any distress in his breathing (even if this occurs later at home if there is potential new growth of mass). Pt had a little trouble at upon first attempt at placing PMV but  upon subsequent attempts became more independent with donning and doffing. PMV was left in place upon SLP departure. Discussed with RN and RT and they were made aware of recommendation to wear during waking hours as tolerated. SLP Visit Diagnosis: Aphonia (R49.1)    SLP Assessment  Patient needs continued Speech [facility]    Recommendations for follow up therapy are one component of a multi-disciplinary discharge planning process, led by the attending physician.  Recommendations may be updated based on patient status, additional functional criteria and insurance authorization.  Follow Up Recommendations  Outpatient SLP    Assistance Recommended at Discharge Intermittent Supervision/Assistance  Functional Status Assessment Patient has had a recent decline in their functional status and demonstrates the ability to make significant improvements in function in a reasonable and predictable amount of time.  Frequency and Duration min 2x/week  2 weeks    PMSV Trial PMSV was placed for: 30+ min Able to redirect subglottic air through upper airway: Yes Able to Attain Phonation: Yes Voice Quality: Hoarse Able to Expectorate Secretions: Yes Level of Secretion Expectoration with PMSV: Tracheal (expectorated via trach prior to PMV placement) Breath Support for Phonation: Adequate Intelligibility: Intelligibility reduced Conversation: 75-100% accurate Respirations During Trial: 15 SpO2 During Trial:  (91-97) Pulse During Trial:  (87-100) Behavior: Alert;Controlled;Cooperative;Expresses self well   Tracheostomy Tube       Vent Dependency       Cuff Deflation Trial Tolerated Cuff Deflation: Yes Length  of Time for Cuff Deflation Trial: 34 Behavior: Alert;Controlled;Cooperative         Osie Bond., M.A. [place] Office [phone]  Secure chat preferred  06/14/2022, 10:15 AM

## 2022-06-14 NOTE — Progress Notes (Addendum)
PROGRESS NOTE    Daniel Marshall  FGH:[id number] DOB: March 04, 1976 DOA: 06/11/2022 PCP: Pcp, No   Brief Narrative:   46 y.o.  male with longstanding history of tobacco use-who was just admitted to [facility] for shortness of breath-was found to have a laryngeal mass-but signed out AMA-presented to [facility] ED-and was subsequently admitted to the hospitalist service. Patient underwent direct laryngoscopy and awake trach on 11/18. Observed in ICU overnight.      Assessment & Plan:  Principal Problem:   Laryngeal mass Active Problems:   Shortness of breath   Neck mass     Assessment and Plan: * Laryngeal mass S/p laryngoscopy biopsy and trach placement on 11/18.  Plan is to change tracheostomy on postop day 5, 11/23 per ENT.  Management per ENT and trach.  CT chest with contrast ordered for staging Speech continues to follow her  COPD Tobacco Use -prn bronchodilators. Counseled to quit using.   Essential HTN -started Losartan '50mg'$  po daily in ICU, IV prn ordered.     Transfer to MedSurg PT/OT Speech and swallow-regular thin liquid   DVT prophylaxis: SCDs Start: 06/11/22 2348 Code Status: Full Family Communication:    Status is: Inpatient Remains inpatient appropriate because: Cont hosp stay intil cleared by ENT and Pulm, sometime later this week.    Subjective: Seen at bedside, no complaints.  He tells me he overall feels better.  Able to communicate   Examination:  General exam: Appears calm and comfortable  Respiratory system: Clear to auscultation. Respiratory effort normal. Cardiovascular system: S1 & S2 heard, RRR. No JVD, murmurs, rubs, gallops or clicks. No pedal edema. Gastrointestinal system: Abdomen is nondistended, soft and nontender. No organomegaly or masses felt. Normal bowel sounds heard. Central nervous system: Alert and oriented. No focal neurological deficits. Extremities: Symmetric 5 x 5 power. Skin: No rashes, lesions or ulcers Psychiatry: Judgement and  insight appear normal. Mood & affect appropriate.   Trach in place.   Objective: Vitals:   06/14/22 0500 06/14/22 0509 06/14/22 0600 06/14/22 0727  BP: (!) 161/90 (!) 147/88 (!) 157/95   Pulse: 69 72 72   Resp: '12 14 13   '$ Temp:    (!) 97.5 F (36.4 C)  TempSrc:    Oral  SpO2: 96% 97% 100%   Weight:      Height:        Intake/Output Summary (Last 24 hours) at 06/14/2022 0757 Last data filed at 06/13/2022 0857 Gross per 24 hour  Intake --  Output 25 ml  Net -25 ml   Filed Weights   06/11/22 1315  Weight: 67.6 kg     Data Reviewed:   CBC: Recent Labs  Lab 06/10/22 0125 06/11/22 1348 06/12/22 0508  WBC 8.7 10.6* 6.9  NEUTROABS 5.8 8.1*  --   HGB 14.8 14.2 14.1  HCT 44.0 41.8 40.1  MCV 87.6 87.4 85.5  PLT 190 230 678   Basic Metabolic Panel: Recent Labs  Lab 06/10/22 0125 06/11/22 1348  NA 131* 131*  K 3.5 3.8  CL 91* 94*  CO2 26 27  GLUCOSE 118* 86  BUN 10 7  CREATININE 0.74 0.60*  CALCIUM 9.3 8.7*   GFR: Estimated Creatinine Clearance: 103 mL/min (A) (by C-G formula based on SCr of 0.6 mg/dL (L)). Liver Function Tests: Recent Labs  Lab 06/11/22 1348  AST 67*  ALT 31  ALKPHOS 74  BILITOT 0.6  PROT 7.8  ALBUMIN 3.5   No results for input(s): "LIPASE", "AMYLASE" in  the last 168 hours. No results for input(s): "AMMONIA" in the last 168 hours. Coagulation Profile: No results for input(s): "INR", "PROTIME" in the last 168 hours. Cardiac Enzymes: No results for input(s): "CKTOTAL", "CKMB", "CKMBINDEX", "TROPONINI" in the last 168 hours. BNP (last 3 results) No results for input(s): "PROBNP" in the last 8760 hours. HbA1C: No results for input(s): "HGBA1C" in the last 72 hours. CBG: Recent Labs  Lab 06/13/22 2242 06/14/22 0350  GLUCAP 98 91   Lipid Profile: No results for input(s): "CHOL", "HDL", "LDLCALC", "TRIG", "CHOLHDL", "LDLDIRECT" in the last 72 hours. Thyroid Function Tests: No results for input(s): "TSH", "T4TOTAL", "FREET4",  "T3FREE", "THYROIDAB" in the last 72 hours. Anemia Panel: No results for input(s): "VITAMINB12", "FOLATE", "FERRITIN", "TIBC", "IRON", "RETICCTPCT" in the last 72 hours. Sepsis Labs: No results for input(s): "PROCALCITON", "LATICACIDVEN" in the last 168 hours.  Recent Results (from the past 240 hour(s))  Resp Panel by RT-PCR (Flu A&B, Covid) Anterior Nasal Swab     Status: None   Collection Time: 06/10/22  1:36 AM   Specimen: Anterior Nasal Swab  Result Value Ref Range Status   SARS Coronavirus 2 by RT PCR NEGATIVE NEGATIVE Final    Comment: (NOTE) SARS-CoV-2 target nucleic acids are NOT DETECTED.  The SARS-CoV-2 RNA is generally detectable in upper respiratory specimens during the acute phase of infection. The lowest concentration of SARS-CoV-2 viral copies this assay can detect is 138 copies/mL. A negative result does not preclude SARS-Cov-2 infection and should not be used as the sole basis for treatment or other patient management decisions. A negative result may occur with  improper specimen collection/handling, submission of specimen other than nasopharyngeal swab, presence of viral mutation(s) within the areas targeted by this assay, and inadequate number of viral copies(<138 copies/mL). A negative result must be combined with clinical observations, patient history, and epidemiological information. The expected result is Negative.  Fact Sheet for Patients:  EntrepreneurPulse.com.au  Fact Sheet for Healthcare Providers:  IncredibleEmployment.be  This test is no t yet approved or cleared by the [place] FDA and  has been authorized for detection and/or diagnosis of SARS-CoV-2 by FDA under an Emergency Use Authorization (EUA). This EUA will remain  in effect (meaning this test can be used) for the duration of the COVID-19 declaration under Section 564(b)(1) of the Act, 21 U.S.C.section 360bbb-3(b)(1), unless the authorization is  terminated  or revoked sooner.       Influenza A by PCR NEGATIVE NEGATIVE Final   Influenza B by PCR NEGATIVE NEGATIVE Final    Comment: (NOTE) The Xpert Xpress SARS-CoV-2/FLU/RSV plus assay is intended as an aid in the diagnosis of influenza from Nasopharyngeal swab specimens and should not be used as a sole basis for treatment. Nasal washings and aspirates are unacceptable for Xpert Xpress SARS-CoV-2/FLU/RSV testing.  Fact Sheet for Patients: EntrepreneurPulse.com.au  Fact Sheet for Healthcare Providers: IncredibleEmployment.be  This test is not yet approved or cleared by the [place] FDA and has been authorized for detection and/or diagnosis of SARS-CoV-2 by FDA under an Emergency Use Authorization (EUA). This EUA will remain in effect (meaning this test can be used) for the duration of the COVID-19 declaration under Section 564(b)(1) of the Act, 21 U.S.C. section 360bbb-3(b)(1), unless the authorization is terminated or revoked.  Performed at [facility], [place] [address] [place], [place] [address]   MRSA Next Gen by PCR, Nasal     Status: None   Collection Time: 06/10/22  5:08 AM  Specimen: Nasal Mucosa; Nasal Swab  Result Value Ref Range Status   MRSA by PCR Next Gen NOT DETECTED NOT DETECTED Final    Comment: (NOTE) The GeneXpert MRSA Assay (FDA approved for NASAL specimens only), is one component of a comprehensive MRSA colonization surveillance program. It is not intended to diagnose MRSA infection nor to guide or monitor treatment for MRSA infections. Test performance is not FDA approved in patients less than 55 years old. Performed at [facility], [place] [address], [place], [place] [address]   Respiratory (~20 pathogens) panel by PCR     Status: None   Collection Time: 06/10/22 11:59 AM   Specimen: Nasopharyngeal Swab; Respiratory  Result Value Ref Range Status   Adenovirus NOT  DETECTED NOT DETECTED Final   Coronavirus 229E NOT DETECTED NOT DETECTED Final    Comment: (NOTE) The Coronavirus on the Respiratory Panel, DOES NOT test for the novel  Coronavirus (2019 nCoV)    Coronavirus HKU1 NOT DETECTED NOT DETECTED Final   Coronavirus NL63 NOT DETECTED NOT DETECTED Final   Coronavirus OC43 NOT DETECTED NOT DETECTED Final   Metapneumovirus NOT DETECTED NOT DETECTED Final   Rhinovirus / Enterovirus NOT DETECTED NOT DETECTED Final   Influenza A NOT DETECTED NOT DETECTED Final   Influenza B NOT DETECTED NOT DETECTED Final   Parainfluenza Virus 1 NOT DETECTED NOT DETECTED Final   Parainfluenza Virus 2 NOT DETECTED NOT DETECTED Final   Parainfluenza Virus 3 NOT DETECTED NOT DETECTED Final   Parainfluenza Virus 4 NOT DETECTED NOT DETECTED Final   Respiratory Syncytial Virus NOT DETECTED NOT DETECTED Final   Bordetella pertussis NOT DETECTED NOT DETECTED Final   Bordetella Parapertussis NOT DETECTED NOT DETECTED Final   Chlamydophila pneumoniae NOT DETECTED NOT DETECTED Final   Mycoplasma pneumoniae NOT DETECTED NOT DETECTED Final    Comment: Performed at [facility], [place]. [address], [place],  [address]         Radiology Studies: DG Chest Port 1 View  Result Date: 06/13/2022 CLINICAL DATA:  Status post tracheostomy EXAM: PORTABLE CHEST 1 VIEW COMPARISON:  06/11/2022 FINDINGS: Tracheostomy tube is noted with tip 5.5 cm above the carina. The cardiomediastinal silhouette is unremarkable. There is no evidence of focal airspace disease, pulmonary edema, suspicious pulmonary nodule/mass, pleural effusion, or pneumothorax. No acute bony abnormalities are identified. Remote rib fractures again identified. IMPRESSION: Tracheostomy tube with tip 5.5 cm above the carina. No significant abnormalities. Electronically Signed   By: Margarette Canada M.D.   On: 06/13/2022 12:03        Scheduled Meds:  Chlorhexidine Gluconate Cloth  6 each Topical Daily   losartan  50  mg Oral Daily   Continuous Infusions:   LOS: 3 days   Time spent= 35 mins    Cesario Weidinger Arsenio Loader, MD [facility]  If 7PM-7AM, please contact night-coverage  06/14/2022, 7:57 AM

## 2022-06-14 NOTE — Progress Notes (Signed)
   NAME:  Daniel Marshall, MRN:  [id number], DOB:  1975-12-01, LOS: 3 ADMISSION DATE:  06/11/2022, CONSULTATION DATE:  11/18 REFERRING MD:  Sloan Leiter, CHIEF COMPLAINT:  Dyspnea   History of Present Illness:  46 y/o male smoker presented to [facility] on 11/15 with cough and dyspnea for several days.  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  Admitted to [facility].  ENT was consulted and recommended elective tracheostomy but the patient left AMA. He presented to the [facility] ER on 11/16 and requested admission.  He was admitted to the progressive care unit by the hospitalist team.  ENT saw him on 11/17 and made arrangements for an elective tracheostomy which was performed today along with laryngeal mass biopsies. He is complaining of surgical site paine.  PCCM has been asked to monitor him in the ICU overnight.    Pertinent  Medical History  Tobacco abuse Asthma Hypertension  Significant Hospital Events: Including procedures, antibiotic start and stop dates in addition to other pertinent events   11/15 admitted with suspected AE COPD 11/16 admission to [facility] 11/18 elective direct laryngoscopy with biopsy, awake tracheostomy  Interim History / Subjective:    Wants to eat, drink  Objective   Blood pressure (!) 157/95, pulse 72, temperature 99 F (37.2 C), temperature source Oral, resp. rate 13, height '5\' 6"'$  (1.676 m), weight 67.6 kg, SpO2 100 %.    FiO2 (%):  [35 %-40 %] 35 %   Intake/Output Summary (Last 24 hours) at 06/14/2022 0710 Last data filed at 06/13/2022 0857 Gross per 24 hour  Intake --  Output 25 ml  Net -25 ml   Filed Weights   06/11/22 1315  Weight: 67.6 kg    Examination:  General:  Resting comfortably in bed HENT: NCAT OP clear tracheostomy site clean, dry, no oozing PULM: CTA B, normal effort CV: RRR, no mgr GI: BS+, soft, nontender MSK: normal bulk and tone Neuro: awake, alert, no distress, MAEW  Was able to use a straw to drink ice water without cough, choking, gag.      Resolved Hospital Problem list     Assessment & Plan:  Laryngeal mass S/p tracheostomy Smoker Hypertension Hyponatremia  Discussion: Doing well after elective tracheostomy.  Plan: Can transfer out of ICU Trach care per routine Needs to f/u with ENT after discharge SLP evaluation> PMV focused OK to eat> advance diet Losartan for hypertension CT chest with contrast today for staging PT/OT for mobility  PCCM available PRN  Best Practice (right click and "Reselect all SmartList Selections" daily)   Per primary service     Critical care time: n/a    Roselie Awkward, MD [place] [facility] Pager: [phone] Cell: [phone] After 7:00 pm call Elink  [phone]

## 2022-06-14 NOTE — Evaluation (Signed)
Clinical/Bedside Swallow Evaluation Patient Details  Name: Daniel Marshall MRN: [id number] Date of Birth: 1975/11/06  Today's Date: 06/14/2022 Time: SLP Start Time (ACUTE ONLY): 3244 SLP Stop Time (ACUTE ONLY): 0102 SLP Time Calculation (min) (ACUTE ONLY): 36 min  Past Medical History:  Past Medical History:  Diagnosis Date   Asthma    as a child   Broken ribs    Dyspnea    Hypertension    Past Surgical History:  Past Surgical History:  Procedure Laterality Date   DIRECT LARYNGOSCOPY N/A 06/13/2022   Procedure: DIRECT LARYNGOSCOPY WITH BIOPSY;  Surgeon: Jenetta Downer, MD;  Location: [facility];  Service: ENT;  Laterality: N/A;   NO PAST SURGERIES     TRACHEOSTOMY TUBE PLACEMENT N/A 06/13/2022   Procedure: AWAKE TRACHEOSTOMY;  Surgeon: Jenetta Downer, MD;  Location: [facility];  Service: ENT;  Laterality: N/A;   HPI:  Pt is a 46 yo male who presented to [facility] 11/15 with several days of cough and dyspnea. 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 Pt returned to [facility] ED on 11/16 and 11/18 pt underwent elective trach and laryngeal mass biopsies. CT showed asymmetric thickening of the right aryepiglottic fold and asymmetric enlargement of the right laryngeal ventricle and piriform sinus. This has progressed in the interval. 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 PMH includes: asthma, broken ribs, dyspnea, HTN    Assessment / Plan / Recommendation  Clinical Impression  Pt does not have overt signs of dysphagia during clinical swallow eval, although intermittent baseline cough noted occasionally after PO trials as well. Aspiration cannot be confirmed or excluded clinically. Note that with mass, pt has involvement of his true vocal folds, R aryepiglottic fold, R laryngeal vestibule, and R pyriform sinus, so swallowing and airway protection could certainly be compromised. Diet already initiated by MD but will still proceed with MBS, as  can be scheduled, to better evaluate oropharyngeal function. In the interim, pt was educated on aspiration precautions and encouraged to wear PMV during PO intake. SLP Visit Diagnosis: Dysphagia, unspecified (R13.10)    Aspiration Risk  Moderate aspiration risk    Diet Recommendation Regular;Thin liquid   Liquid Administration via: Cup;Straw Medication Administration: Whole meds with puree Supervision: Patient able to self feed;Intermittent supervision to cue for compensatory strategies Compensations: Slow rate;Small sips/bites Postural Changes: Seated upright at 90 degrees    Other  Recommendations Oral Care Recommendations: Oral care BID Other Recommendations: Place PMSV during PO intake    Recommendations for follow up therapy are one component of a multi-disciplinary discharge planning process, led by the attending physician.  Recommendations may be updated based on patient status, additional functional criteria and insurance authorization.  Follow up Recommendations Outpatient SLP      Assistance Recommended at Discharge Intermittent Supervision/Assistance  Functional Status Assessment Patient has had a recent decline in their functional status and demonstrates the ability to make significant improvements in function in a reasonable and predictable amount of time.  Frequency and Duration min 2x/week          Prognosis        Swallow Study   General HPI: Pt is a 46 yo male who presented to [facility] 11/15 with several days of cough and dyspnea. 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 Pt returned to [facility] ED on 11/16 and 11/18 pt underwent elective trach and laryngeal mass biopsies. CT showed  asymmetric thickening of the right aryepiglottic fold and asymmetric enlargement of the right laryngeal ventricle and piriform sinus. This has progressed in the interval. 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 PMH includes:  asthma, broken ribs, dyspnea, HTN Type of Study: Bedside Swallow Evaluation Previous Swallow Assessment: none in chart Diet Prior to this Study: Regular;Thin liquids Temperature Spikes Noted: No Respiratory Status: Trach Collar;Trach Trach Size and Type: Cuff;#6;Deflated;With PMSV in place History of Recent Intubation: No Behavior/Cognition: Alert;Cooperative;Pleasant mood Oral Cavity Assessment: Within Functional Limits Oral Care Completed by SLP: No Oral Cavity - Dentition: Adequate natural dentition Vision: Functional for self-feeding Self-Feeding Abilities: Able to feed self Patient Positioning: Upright in chair Baseline Vocal Quality: Hoarse Volitional Cough: Strong Volitional Swallow: Able to elicit    Oral/Motor/Sensory Function Overall Oral Motor/Sensory Function: Within functional limits   Ice Chips Ice chips: Not tested   Thin Liquid Thin Liquid: Within functional limits Presentation: Self Fed;Straw    Nectar Thick Nectar Thick Liquid: Not tested   Honey Thick Honey Thick Liquid: Not tested   Puree Puree: Within functional limits Presentation: Self Fed;Spoon   Solid     Solid: Within functional limits Presentation: Self Fed      Osie Bond., M.A. [place] Office [phone]  Secure chat preferred  06/14/2022,11:17 AM

## 2022-06-14 NOTE — Evaluation (Signed)
Physical Therapy Evaluation Patient Details Name: Daniel Marshall MRN: [id number] DOB: July 24, 1976 Today's Date: 06/14/2022  History of Present Illness  46 y.o. male presents to [facility] hospital on 06/11/2022 with persistent SOB and cough. Pt left AMA from [facility] 11/15 after newly diagnosed larynx mass with vocal cord paresis. Pt underwent tracheostomy and biopsy of laryngeal mass on 11/18. PMH includes asthma, tobacco use, HTN.  Clinical Impression  Pt presents to PT with deficits in cardiopulmonary function, but is otherwise mobilizing independently. Pt is able to ambulate and negotiate steps without assistance or signs of balance/gait deviation. Pt is encouraged to mobilize frequently in an effort to maintain his current level of function. Pt has no further acute or post-acute PT needs. Acute PT signing off.       Recommendations for follow up therapy are one component of a multi-disciplinary discharge planning process, led by the attending physician.  Recommendations may be updated based on patient status, additional functional criteria and insurance authorization.  Follow Up Recommendations No PT follow up      Assistance Recommended at Discharge PRN  Patient can return home with the following       Equipment Recommendations None recommended by PT  Recommendations for Other Services       Functional Status Assessment Patient has had a recent decline in their functional status and demonstrates the ability to make significant improvements in function in a reasonable and predictable amount of time.     Precautions / Restrictions Precautions Precautions: Other (comment) Precaution Comments: new trach with PMV Restrictions Weight Bearing Restrictions: No      Mobility  Bed Mobility                    Transfers Overall transfer level: Independent                      Ambulation/Gait Ambulation/Gait assistance: Independent Gait Distance (Feet): 400  Feet Assistive device: None Gait Pattern/deviations: WFL(Within Functional Limits) Gait velocity: functional Gait velocity interpretation: >2.62 ft/sec, indicative of community ambulatory   General Gait Details: steady ste-through gait  Stairs Stairs: Yes Stairs assistance: Independent Stair Management: No rails, Alternating pattern Number of Stairs: 12    Wheelchair Mobility    Modified Rankin (Stroke Patients Only)       Balance Overall balance assessment: Independent                                           Pertinent Vitals/Pain Pain Assessment Pain Assessment: 0-10 Pain Score: 5  Pain Location: neck at trach site Pain Descriptors / Indicators: Sore Pain Intervention(s): Monitored during session    Home Living Family/patient expects to be discharged to:: Private residence Living Arrangements: Spouse/significant other;Children (fiance, son) Available Help at Discharge: Family;Available PRN/intermittently Type of Home: House Home Access: Stairs to enter Entrance Stairs-Rails: None Entrance Stairs-Number of Steps: 2 Alternate Level Stairs-Number of Steps: 12 Home Layout: Two level Home Equipment: None      Prior Function Prior Level of Function : Independent/Modified Independent;Driving                     Hand Dominance        Extremity/Trunk Assessment   Upper Extremity Assessment Upper Extremity Assessment: Overall WFL for tasks assessed    Lower Extremity Assessment Lower Extremity Assessment: Overall WFL for  tasks assessed    Cervical / Trunk Assessment Cervical / Trunk Assessment: Normal  Communication   Communication: No difficulties  Cognition Arousal/Alertness: Awake/alert Behavior During Therapy: WFL for tasks assessed/performed Overall Cognitive Status: Within Functional Limits for tasks assessed                                          General Comments General comments (skin integrity,  edema, etc.): VSS, pt initially on trach collar, 5L 35% FiO2, able to wean to room air when mobilizing with steady sats in low 90s    Exercises     Assessment/Plan    PT Assessment Patient does not need any further PT services  PT Problem List         PT Treatment Interventions      PT Goals (Current goals can be found in the Care Plan section)       Frequency       Co-evaluation               AM-PAC PT "6 Clicks" Mobility  Outcome Measure Help needed turning from your back to your side while in a flat bed without using bedrails?: None Help needed moving from lying on your back to sitting on the side of a flat bed without using bedrails?: None Help needed moving to and from a bed to a chair (including a wheelchair)?: None Help needed standing up from a chair using your arms (e.g., wheelchair or bedside chair)?: None Help needed to walk in hospital room?: None Help needed climbing 3-5 steps with a railing? : None 6 Click Score: 24    End of Session   Activity Tolerance: Patient tolerated treatment well Patient left: in chair;with call bell/phone within reach Nurse Communication: Mobility status PT Visit Diagnosis: Other abnormalities of gait and mobility (R26.89)    Time: 1610-9604 PT Time Calculation (min) (ACUTE ONLY): 17 min   Charges:   PT Evaluation $PT Eval Low Complexity: Perry, PT, DPT Acute Rehabilitation Office [phone]   Zenaida Niece 06/14/2022, 1:29 PM

## 2022-06-14 NOTE — Progress Notes (Signed)
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 CXR unremarkable.  Patient denies pain. He has been drinking without an issue. States he can get out of bed without assistance. Eager to work with speech therapy today. Understands the gravity of the situation (communicated with clip board)  O: 6-0 Tracheostomy secured with silk sutures and ties. Hemostatic. 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 Doing well  - I recommend SCD's while inpatient to reduce DVT - F/u surgical path - CT chest with contrast for staging - Tracheostomy change POD#5 (06/18/22) - Anticipate referral to [facility] for Laryngectomy if path confirms malignancy   Electronically signed by:  Jenetta Downer, MD  Staff Physician Facial Plastic & Reconstructive Surgery Otolaryngology - [facility] [place], [place]

## 2022-06-15 ENCOUNTER — Inpatient Hospital Stay (HOSPITAL_COMMUNITY): Payer: Medicaid Other

## 2022-06-15 LAB — ALPHA-1-ANTITRYPSIN PHENOTYP: A-1 Antitrypsin, Ser: 227 mg/dL — ABNORMAL HIGH (ref 101–187)

## 2022-06-15 NOTE — Progress Notes (Signed)
Patient  has minimum bleeding to his Trach, respiratory informed and has temp of 100.2 BP 133/90, MAP 100,HR 95,R 20,O2SAT 97%. On-call J Daniels,NP informed  and orders received. Patient refused Tylenol,ice and blood work,on -call notified,will continue to monitor.

## 2022-06-15 NOTE — Progress Notes (Addendum)
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 Has PMV.   Patient denies pain. He is frustrated about being woken up throughout the night.  O: 6-0 Tracheostomy secured with silk sutures and ties. Hemostatic. Cuff down. PMV in place Ambulating the room  IMAGING: CT Chest with contrast 06/14/22  IMPRESSION: Tracheostomy in satisfactory position. Associated postprocedural changes.   No findings suspicious for metastatic disease in the chest.   3 mm nodule at the left lung apex, stable versus mildly improved, benign. No follow-up is recommended.   Emphysema (ICD10-J43.9).     Electronically Signed   By: Julian Hy M.D.   On: 06/14/2022 21:55   CT neck with contrast 06/10/22  IMPRESSION: 1. Image quality degraded by extensive motion. 2. Asymmetric thickening of the right aryepiglottic fold and asymmetric enlargement of the right laryngeal ventricle and piriform sinus. This has progressed in the interval. 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 Recommend laryngoscopy. 3. No tonsillar abscess. No adenopathy in the neck. 4. Mild atherosclerotic disease in the carotid artery bilaterally.     Electronically Signed   By: Franchot Gallo M.D.   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 Doing well. Staging CT chest negative for malignancy.  - I recommend SCD's while inpatient to reduce DVT - F/u surgical path - Tracheostomy change POD#6 (06/19/22); please have Shiley 5-0 cuffless tracheostomy at bedside and new soft collar. Please work with DME / discharge planning to order appropriate tracheostomy supplies for patient upon discharge at the end of the week.  - Anticipate referral to [facility] for Laryngectomy if path confirms malignancy   Electronically  signed by:  Jenetta Downer, MD  Staff Physician Facial Plastic & Reconstructive Surgery Otolaryngology - [facility] and [place], [place]

## 2022-06-15 NOTE — Progress Notes (Signed)
OT Cancellation Note and Discharge  Patient Details Name: Daniel Marshall MRN: [id number] DOB: 11/17/1975   Cancelled Treatment:    Reason Eval/Treat Not Completed: OT screened, no needs identified, will sign off. Pt was independent with PT yesterday and in speaking to patient today he reports not issues with basic ADLs. I recommended he ask respiratory about a trach shower collar.   [place] Circle, OTR/L Acute Rehab Services [facility] [place] [phone] Office [phone]    Almon Register 06/15/2022, 12:47 PM

## 2022-06-15 NOTE — TOC Initial Note (Addendum)
Transition of Care (TOC) - Initial/Assessment Note   Spoke to patient and his girl friend [place] Lablanche at bedside . Patient from home , lives with [place].   Patient received paperwork in the mail from [facility] [id number] P, but is unsure if medicaid is active. NCM called admitting provided information. Admitting called NCM back and medicaid is not active. NCM called financial navigator Saprese Jones [phone] and left voicemail. Patient and Armandina Stammer aware.   Armandina Stammer on [facility] website using chat option to see when Medicaid will be active.   NCM explained supplies will be ordered through [place]. NCM discussed above with [facility] , they will approve 30 day letter of guarantee for trach supplies . Erasmo Downer with [facility] aware.   NCM secure chatted DR Hoshal to clarify trach supplies for home . Once determined can provide [facility] with LOG.   Discussed with patient and [place] respiratory and nursing staff will provide education on trach care prior to discharge. Nursing will provide 5 days worth of trach supplies to patient , ambu bag , one trach one size smaller at discharge . Gorda willing to learn trach care best number to reach her at is [phone]   [place] will bring portable suction to patient's room prior to discharge .   [facility] is the agency assisting patient's with no insurance this week retortaion ends tomorrow . Spoke to [employer] with [facility] he will check to see if he can accept referral.   [place] starts rotation tomorrow . Caryl Pina with [facility], only accepts trachs that are 21 days old   Called respiratory department left a voicemail to see if education can be started . Respiratory called back aware discharge date is Friday and will contact patient and girlfriend for teaching . Respiratory left Armandina Stammer a message regarding setting up education   Dr Sabino Gasser replied to chat, anticipated discharge date is FRiday , with number 5 cuffless  shily , suction humidified air , non fenestrated . Kritin with [facility] aware and will complete Tracheostomy order form and email directly to Dr Sabino Gasser for signature. [email or web address]    Kristin with [facility] will provide NCM with a complete list of supplies with cost for 30 days. Once received NCM will submit to leadership for Patrick [place] with [facility] emailed order from to MD for signature . NCM submitted LOG for supplies to leadership  [place] with [facility] unable to take new trach .   Patient states his Medicaid Healthy is active June 26, 2022.   Caryl Pina with [facility] still waiting to hear from her office if she can accept. Caryl Pina asking if NCM can find an agency to accept patient on June 26, 2022 under his Medicaid [place] , if she can provide charity [facility] for the first week , because [facility] is not in network with [employer] .   Agencies contacted :   [facility] does not take new trachs.  [facility] does not take new trachs.  [facility] takes new trachs but is not in network with Medicaid [employer]   [facility] is not in network with Medicaid [employer] [place]   [facility] is not in network with [employer]   [facility] is not in network with Medicaid Healthy [employer]   does not have the staffing to accept patient   [facility] does not have staffing to accept referral   [facility] unable to accept referral   NCM  has exhausted home health agency list for patient's address.    Patient Details  Name: HOLDYN POYSER MRN: [id number] Date of Birth: Dec 16, 1975  Transition of Care [facility]) CM/SW Contact:    Marilu Favre, RN Phone Number: 06/15/2022, 1:49 PM  Clinical Narrative:                   Expected Discharge Plan: [place] Barriers to Discharge: Continued Medical Work up   Patient Goals and CMS Choice Patient states their goals for this hospitalization and ongoing recovery are:: to return to home       Expected Discharge Plan and Services Expected Discharge Plan: [place] In-house Referral: Financial Counselor                         DME Agency: [facility] Date DME Agency Contacted: 06/15/22 Time DME Agency Contacted: 58 Representative spoke with at DME Agency: Erasmo Downer            Prior Living Arrangements/Services   Lives with:: Significant Other Patient language and need for interpreter reviewed:: Yes Do you feel safe going back to the place where you live?: Yes      Need for Family Participation in Patient Care: Yes (Comment) Care giver support system in place?: Yes (comment)   Criminal Activity/Legal Involvement Pertinent to Current Situation/Hospitalization: No - Comment as needed  Activities of Daily Living Home Assistive Devices/Equipment: None ADL Screening (condition at time of admission) Patient's cognitive ability adequate to safely complete daily activities?: Yes Is the patient deaf or have difficulty hearing?: No Does the patient have difficulty seeing, even when wearing glasses/contacts?: No Does the patient have difficulty concentrating, remembering, or making decisions?: No Patient able to express need for assistance with ADLs?: Yes Does the patient have difficulty dressing or bathing?: No Independently performs ADLs?: Yes (appropriate for developmental age) Does the patient have difficulty walking or climbing stairs?: No Weakness of Legs: None Weakness of Arms/Hands: None  Permission Sought/Granted   Permission granted to share information with : Yes, Verbal Permission Granted  Share Information with NAME: Marya Amsler           Emotional Assessment Appearance:: Appears stated age   Affect (typically observed): Accepting Orientation: : Oriented to Self, Oriented to Place, Oriented to  Time, Oriented to Situation Alcohol / Substance Use: Not Applicable Psych Involvement: No (comment)  Admission diagnosis:   Stridor [R06.1] SOB (shortness of breath) [R06.02] Laryngeal mass [J38.7] Neck mass [R22.1] Patient Active Problem List   Diagnosis Date Noted   Neck mass 06/13/2022   Laryngeal mass 06/11/2022   COPD exacerbation ([place]) 06/10/2022   Acute respiratory failure with hypoxia (HCC) 06/10/2022   Smoking 06/10/2022   Malnutrition of moderate degree 06/10/2022   Mild asthma 10/16/2018   Wheezes 10/16/2018   Shortness of breath 10/16/2018   Coughing 08/16/2017   Bronchospasm 08/16/2017   PCP:  Pcp, No Pharmacy:   [id number] [address], [place] - [address] [place] [address] Phone: [phone] Fax: [phone]  [facility] [phone] - [place], [address] AT [facility] [address] [place] [address] Phone: [phone] Fax: [phone]     Social Determinants of Health (SDOH) Interventions    Readmission Risk Interventions     No data to display

## 2022-06-15 NOTE — Progress Notes (Signed)
Modified Barium Swallow Progress Note  Patient Details  Name: Daniel Marshall MRN: [id number] Date of Birth: [birth year]  Today's Date: 06/15/2022  Modified Barium Swallow completed.  Full report located under Chart Review in the Imaging Section.  Brief recommendations include the following:  Clinical Impression  Pt has a mild pharyngeal dysphagia with reduced base of tongue retraction and epiglottic inversion. There is mild vallecular residue, noted most with more solid foods. Laryngeal vestibule closure is relatively intact considering location of mass. Penetration occurred with thin liquids before the swallow, but it was trace. What did not clear spontaneously was cleared easily with a cued throat clear. Recommend continuing with regular diet and thin liquids with use of PMV  for any PO intake and use of intermittent throat clear.   Swallow Evaluation Recommendations       SLP Diet Recommendations: Regular solids;Thin liquid   Liquid Administration via: Cup;Straw   Medication Administration: Whole meds with liquid   Supervision: Patient able to self feed;Intermittent supervision to cue for compensatory strategies   Compensations: Slow rate;Small sips/bites;Clear throat intermittently   Postural Changes: Seated upright at 90 degrees   Oral Care Recommendations: Oral care BID   Other Recommendations: Place PMSV during PO intake    Osie Bond., M.A. [place] Office [phone]  Secure chat preferred  06/15/2022,3:16 PM

## 2022-06-15 NOTE — Progress Notes (Signed)
PROGRESS NOTE    Daniel Marshall  RKY:[id number] DOB: [birth year] DOA: 06/11/2022 PCP: Pcp, No   Brief Narrative:   46 y.o.  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 Patient underwent direct laryngoscopy and awake trach on 11/18. Observed in ICU overnight.  Patient has now been transferred to [place] floor, doing well.     Assessment & Plan:  Principal Problem:   Laryngeal mass Active Problems:   Shortness of breath   Neck mass     Assessment and Plan: * Laryngeal mass S/p laryngoscopy biopsy and trach placement on 11/18.  Plan is to change tracheostomy on postop day 5, 11/23 per ENT.  Management per ENT and trach.  CT chest with contrast does not show any metastatic disease Speech continues to follow her  COPD Tobacco Use -prn bronchodilators. Counseled to quit using.   Essential HTN -started Losartan '50mg'$  po daily in ICU, IV prn ordered.     Transfer to MedSurg PT/OT Speech and swallow-regular thin liquid   DVT prophylaxis: SCDs Start: 06/11/22 2348 Code Status: Full Family Communication:    Status is: Inpatient Remains inpatient appropriate because: Cont hosp stay intil cleared by ENT and Pulm, sometime later this week- 11/23.    Subjective: Seen and examined at bedside, no complaints  Examination: Constitutional: Not in acute distress Respiratory: Clear to auscultation bilaterally Cardiovascular: Normal sinus rhythm, no rubs Abdomen: Nontender nondistended good bowel sounds Musculoskeletal: No edema noted Skin: No rashes seen Neurologic: CN 2-12 grossly intact.  And nonfocal Psychiatric: Normal judgment and insight. Alert and oriented x 3. Normal mood.   Trach in place.   Objective: Vitals:   06/15/22 0008 06/15/22 0156 06/15/22 0242 06/15/22 0814  BP: 134/86   (!) 133/90 127/70  Pulse: (!) 108 (!) 102 95 93  Resp:  '18 20 18  '$ Temp: 99.3 F (37.4 C)  100.2 F (37.9 C) 98.7 F (37.1 C)  TempSrc: Oral  Oral Oral  SpO2: 99% 99% 97% 97%  Weight:      Height:       No intake or output data in the 24 hours ending 06/15/22 0849  Filed Weights   06/11/22 1315  Weight: 67.6 kg     Data Reviewed:   CBC: Recent Labs  Lab 06/10/22 0125 06/11/22 1348 06/12/22 0508  WBC 8.7 10.6* 6.9  NEUTROABS 5.8 8.1*  --   HGB 14.8 14.2 14.1  HCT 44.0 41.8 40.1  MCV 87.6 87.4 85.5  PLT 190 230 315   Basic Metabolic Panel: Recent Labs  Lab 06/10/22 0125 06/11/22 1348  NA 131* 131*  K 3.5 3.8  CL 91* 94*  CO2 26 27  GLUCOSE 118* 86  BUN 10 7  CREATININE 0.74 0.60*  CALCIUM 9.3 8.7*   GFR: Estimated Creatinine Clearance: 103 mL/min (A) (by C-G formula based on SCr of 0.6 mg/dL (L)). Liver Function Tests: Recent Labs  Lab 06/11/22 1348  AST 67*  ALT 31  ALKPHOS 74  BILITOT 0.6  PROT 7.8  ALBUMIN 3.5   No results for input(s): "LIPASE", "AMYLASE" in the last 168 hours. No results for input(s): "AMMONIA" in the last 168 hours. Coagulation Profile: No results for input(s): "INR", "PROTIME" in the last 168 hours. Cardiac Enzymes: No results for input(s): "CKTOTAL", "CKMB", "CKMBINDEX", "TROPONINI" in the last 168 hours. BNP (  last 3 results) No results for input(s): "PROBNP" in the last 8760 hours. HbA1C: No results for input(s): "HGBA1C" in the last 72 hours. CBG: Recent Labs  Lab 06/13/22 2242 06/14/22 0350  GLUCAP 98 91   Lipid Profile: No results for input(s): "CHOL", "HDL", "LDLCALC", "TRIG", "CHOLHDL", "LDLDIRECT" in the last 72 hours. Thyroid Function Tests: No results for input(s): "TSH", "T4TOTAL", "FREET4", "T3FREE", "THYROIDAB" in the last 72 hours. Anemia Panel: No results for input(s): "VITAMINB12", "FOLATE", "FERRITIN", "TIBC", "IRON", "RETICCTPCT" in the last 72 hours. Sepsis Labs: No results for input(s):  "PROCALCITON", "LATICACIDVEN" in the last 168 hours.  Recent Results (from the past 240 hour(s))  Resp Panel by RT-PCR (Flu A&B, Covid) Anterior Nasal Swab     Status: None   Collection Time: 06/10/22  1:36 AM   Specimen: Anterior Nasal Swab  Result Value Ref Range Status   SARS Coronavirus 2 by RT PCR NEGATIVE NEGATIVE Final    Comment: (NOTE) SARS-CoV-2 target nucleic acids are NOT DETECTED.  The SARS-CoV-2 RNA is generally detectable in upper respiratory specimens during the acute phase of infection. The lowest concentration of SARS-CoV-2 viral copies this assay can detect is 138 copies/mL. A negative result does not preclude SARS-Cov-2 infection and should not be used as the sole basis for treatment or other patient management decisions. A negative result may occur with  improper specimen collection/handling, submission of specimen other than nasopharyngeal swab, presence of viral mutation(s) within the areas targeted by this assay, and inadequate number of viral copies(<138 copies/mL). A negative result must be combined with clinical observations, patient history, and epidemiological information. The expected result is Negative.  Fact Sheet for Patients:  EntrepreneurPulse.com.au  Fact Sheet for Healthcare Providers:  IncredibleEmployment.be  This test is no t yet approved or cleared by the [place] FDA and  has been authorized for detection and/or diagnosis of SARS-CoV-2 by FDA under an Emergency Use Authorization (EUA). This EUA will remain  in effect (meaning this test can be used) for the duration of the COVID-19 declaration under Section 564(b)(1) of the Act, 21 U.S.C.section 360bbb-3(b)(1), unless the authorization is terminated  or revoked sooner.       Influenza A by PCR NEGATIVE NEGATIVE Final   Influenza B by PCR NEGATIVE NEGATIVE Final    Comment: (NOTE) The Xpert Xpress SARS-CoV-2/FLU/RSV plus assay is intended as an  aid in the diagnosis of influenza from Nasopharyngeal swab specimens and should not be used as a sole basis for treatment. Nasal washings and aspirates are unacceptable for Xpert Xpress SARS-CoV-2/FLU/RSV testing.  Fact Sheet for Patients: EntrepreneurPulse.com.au  Fact Sheet for Healthcare Providers: IncredibleEmployment.be  This test is not yet approved or cleared by the [place] FDA and has been authorized for detection and/or diagnosis of SARS-CoV-2 by FDA under an Emergency Use Authorization (EUA). This EUA will remain in effect (meaning this test can be used) for the duration of the COVID-19 declaration under Section 564(b)(1) of the Act, 21 U.S.C. section 360bbb-3(b)(1), unless the authorization is terminated or revoked.  Performed at [facility], [place] [address], [place], [place] [address]   MRSA Next Gen by PCR, Nasal     Status: None   Collection Time: 06/10/22  5:08 AM   Specimen: Nasal Mucosa; Nasal Swab  Result Value Ref Range Status   MRSA by PCR Next Gen NOT DETECTED NOT DETECTED Final    Comment: (NOTE) The GeneXpert MRSA Assay (FDA approved for NASAL specimens only), is one component of a  comprehensive MRSA colonization surveillance program. It is not intended to diagnose MRSA infection nor to guide or monitor treatment for MRSA infections. Test performance is not FDA approved in patients less than 42 years old. Performed at [facility], [place] [address] [place], [place] [address]   Respiratory (~20 pathogens) panel by PCR     Status: None   Collection Time: 06/10/22 11:59 AM   Specimen: Nasopharyngeal Swab; Respiratory  Result Value Ref Range Status   Adenovirus NOT DETECTED NOT DETECTED Final   Coronavirus 229E NOT DETECTED NOT DETECTED Final    Comment: (NOTE) The Coronavirus on the Respiratory Panel, DOES NOT test for the novel  Coronavirus (2019 nCoV)    Coronavirus HKU1  NOT DETECTED NOT DETECTED Final   Coronavirus NL63 NOT DETECTED NOT DETECTED Final   Coronavirus OC43 NOT DETECTED NOT DETECTED Final   Metapneumovirus NOT DETECTED NOT DETECTED Final   Rhinovirus / Enterovirus NOT DETECTED NOT DETECTED Final   Influenza A NOT DETECTED NOT DETECTED Final   Influenza B NOT DETECTED NOT DETECTED Final   Parainfluenza Virus 1 NOT DETECTED NOT DETECTED Final   Parainfluenza Virus 2 NOT DETECTED NOT DETECTED Final   Parainfluenza Virus 3 NOT DETECTED NOT DETECTED Final   Parainfluenza Virus 4 NOT DETECTED NOT DETECTED Final   Respiratory Syncytial Virus NOT DETECTED NOT DETECTED Final   Bordetella pertussis NOT DETECTED NOT DETECTED Final   Bordetella Parapertussis NOT DETECTED NOT DETECTED Final   Chlamydophila pneumoniae NOT DETECTED NOT DETECTED Final   Mycoplasma pneumoniae NOT DETECTED NOT DETECTED Final    Comment: Performed at [facility], [place]. [address] [place], [place] [address]         Radiology Studies: CT CHEST W CONTRAST  Result Date: 06/14/2022 CLINICAL DATA:  Head neck cancer, for staging EXAM: CT CHEST WITH CONTRAST TECHNIQUE: Multidetector CT imaging of the chest was performed during intravenous contrast administration. RADIATION DOSE REDUCTION: This exam was performed according to the departmental dose-optimization program which includes automated exposure control, adjustment of the mA and/or kV according to patient size and/or use of iterative reconstruction technique. CONTRAST:  54m OMNIPAQUE IOHEXOL 350 MG/ML SOLN COMPARISON:  Chest radiograph dated 06/13/2022. CTA chest dated 01/06/2022. FINDINGS: Cardiovascular: Heart is normal in size.  No pericardial effusion. No evidence of thoracic aortic aneurysm. Mediastinum/Nodes: No suspicious mediastinal lymphadenopathy. Retrosternal fluid/gas, likely reflecting postprocedural changes in the setting of recent tracheostomy. Visualized thyroid is unremarkable. Lungs/Pleura: Tracheostomy in  satisfactory position. Mild centrilobular emphysematous changes, upper lung predominant. No focal consolidation. 3 mm nodule at the left lung apex (series 4/image 32), stable versus mildly improved. No new/suspicious pulmonary nodules. No pleural effusion or pneumothorax. Upper Abdomen: Visualized upper abdomen is grossly unremarkable. Musculoskeletal: Visualized osseous structures are within normal limits. IMPRESSION: Tracheostomy in satisfactory position. Associated postprocedural changes. No findings suspicious for metastatic disease in the chest. 3 mm nodule at the left lung apex, stable versus mildly improved, benign. No follow-up is recommended. Emphysema (ICD10-J43.9). Electronically Signed   By: SJulian HyM.D.   On: 06/14/2022 21:55   DG Chest Port 1 View  Result Date: 06/13/2022 CLINICAL DATA:  Status post tracheostomy EXAM: PORTABLE CHEST 1 VIEW COMPARISON:  06/11/2022 FINDINGS: Tracheostomy tube is noted with tip 5.5 cm above the carina. The cardiomediastinal silhouette is unremarkable. There is no evidence of focal airspace disease, pulmonary edema, suspicious pulmonary nodule/mass, pleural effusion, or pneumothorax. No acute bony abnormalities are identified. Remote rib fractures again identified. IMPRESSION: Tracheostomy tube  with tip 5.5 cm above the carina. No significant abnormalities. Electronically Signed   By: Margarette Canada M.D.   On: 06/13/2022 12:03        Scheduled Meds:  losartan  50 mg Oral Daily   Continuous Infusions:   LOS: 4 days   Time spent= 35 mins    Mortimer Bair Arsenio Loader, MD [facility]  If 7PM-7AM, please contact night-coverage  06/15/2022, 8:49 AM

## 2022-06-16 LAB — CBC
HCT: 34.1 % — ABNORMAL LOW (ref 39.0–52.0)
Hemoglobin: 11.9 g/dL — ABNORMAL LOW (ref 13.0–17.0)
MCH: 29.9 pg (ref 26.0–34.0)
MCHC: 34.9 g/dL (ref 30.0–36.0)
MCV: 85.7 fL (ref 80.0–100.0)
Platelets: 197 10*3/uL (ref 150–400)
RBC: 3.98 MIL/uL — ABNORMAL LOW (ref 4.22–5.81)
RDW: 14.4 % (ref 11.5–15.5)
WBC: 9.8 10*3/uL (ref 4.0–10.5)
nRBC: 0 % (ref 0.0–0.2)

## 2022-06-16 LAB — BASIC METABOLIC PANEL
Anion gap: 10 (ref 5–15)
BUN: 8 mg/dL (ref 6–20)
CO2: 28 mmol/L (ref 22–32)
Calcium: 8.5 mg/dL — ABNORMAL LOW (ref 8.9–10.3)
Chloride: 96 mmol/L — ABNORMAL LOW (ref 98–111)
Creatinine, Ser: 0.67 mg/dL (ref 0.61–1.24)
GFR, Estimated: >60 mL/min (ref >60)
Glucose, Bld: 157 mg/dL — ABNORMAL HIGH (ref 70–99)
Potassium: 3.1 mmol/L — ABNORMAL LOW (ref 3.5–5.1)
Sodium: 134 mmol/L — ABNORMAL LOW (ref 135–145)

## 2022-06-16 LAB — SURGICAL PATHOLOGY

## 2022-06-16 LAB — MAGNESIUM: Magnesium: 1.9 mg/dL (ref 1.7–2.4)

## 2022-06-16 MED ORDER — POTASSIUM CHLORIDE CRYS ER 20 MEQ PO TBCR
40.0000 meq | EXTENDED_RELEASE_TABLET | ORAL | Status: AC
  Administered 2022-06-16 (×2): 40 meq via ORAL
  Filled 2022-06-16 (×2): qty 2

## 2022-06-16 NOTE — Progress Notes (Signed)
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 Slept better. No complaints Surg path still pending  O: 6-0 Tracheostomy secured with silk sutures and ties. Hemostatic. Cuff down. PMV in place. Inner cannula replaced.  Ambulating the room  IMAGING: CT Chest with contrast 06/14/22  IMPRESSION: Tracheostomy in satisfactory position. Associated postprocedural changes.   No findings suspicious for metastatic disease in the chest.   3 mm nodule at the left lung apex, stable versus mildly improved, benign. No follow-up is recommended.   Emphysema (ICD10-J43.9).     Electronically Signed   By: Julian Hy M.D.   On: 06/14/2022 21:55   CT neck with contrast 06/10/22  IMPRESSION: 1. Image quality degraded by extensive motion. 2. Asymmetric thickening of the right aryepiglottic fold and asymmetric enlargement of the right laryngeal ventricle and piriform sinus. This has progressed in the interval. 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 Recommend laryngoscopy. 3. No tonsillar abscess. No adenopathy in the neck. 4. Mild atherosclerotic disease in the carotid artery bilaterally.     Electronically Signed   By: Franchot Gallo M.D.   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 Doing well. Staging CT chest negative for malignancy.  - Please order humidified trach mist while in bed to reduce crusting/thick mucous - F/u surgical path - Tracheostomy change POD#6 (06/19/22); please have Shiley 5-0 cuffless tracheostomy at bedside and new soft collar. Please work with DME / discharge planning to order appropriate tracheostomy supplies for patient upon discharge at the end of the week.  - Anticipate referral to [facility] for Laryngectomy if path confirms malignancy   Electronically  signed by:  Jenetta Downer, MD  Staff Physician Facial Plastic & Reconstructive Surgery Otolaryngology - [place] and [place], [place]

## 2022-06-16 NOTE — TOC Progression Note (Addendum)
Transition of Care (TOC) - Progression Note   See note from yesterday.   Awaiting call back from [facility] regarding home health. All other agencies have declined. [facility] has declined referral. Patient will NOT have home health services. Secure chatted Dr Sabino Gasser and patient aware.   Respiratory and nursing will provided education on trach care.   Daniel Marshall with [facility] will email [facility]  trach order sheet signature directly. [facility] has not received the trach order form back from you  yet .NCM secure chatted Dr Sabino Gasser  Patient discussed in progression today . Nurse and charge aware patient and girlfriend need education and 5 days of trach supplies at discharge ( Friday) , one trach one size smaller and AMBU bag .   [facility] will bring portable suction to room prior to discharge.   [facility] is paying for trach supplies for 30 days through [facility]   Patient PCP is [facility] [place]. NCM has called twice and receives voicemail. NCM left message patient discharging Friday and needs follow up appointment . Await call back.  Patient Details  Name: Daniel Marshall MRN: [id number] Date of Birth: October 18, 1975  Transition of Care [facility]) CM/SW Contact  Daniel Marshall, Daniel Snowball, RN Phone Number: 06/16/2022, 10:06 AM  Clinical Narrative:       Expected Discharge Plan: [place] Barriers to Discharge: Continued Medical Work up  Expected Discharge Plan and Services Expected Discharge Plan: [place] In-house Referral: Financial Counselor                         DME Agency: [facility] Date DME Agency Contacted: 06/15/22 Time DME Agency Contacted: 1700 Representative spoke with at DME Agency: Daniel Marshall             Social Determinants of Health ([place]) Interventions    Readmission Risk Interventions     No data to display

## 2022-06-16 NOTE — Progress Notes (Signed)
Speech Language Pathology Treatment: Dysphagia;Passy Muir Speaking valve  Patient Details Name: Daniel Marshall MRN: [id number] DOB: March 12, 1976 Today's Date: 06/16/2022 Time: 1139-1150 SLP Time Calculation (min) (ACUTE ONLY): 11 min  Assessment / Plan / Recommendation Clinical Impression  Pt was preparing his coffee upon SLP arrival and donned PMV with Mod I prior to drinking. He needed reinforcement of education to recall results/recommendations from [facility] on previous date, including use of intermittent throat clear. Otherwise, he did consume thin liquids without any overt signs of difficulty. Pt had verbalized understanding of strategies recommended. He says he is wearing PMV during all waking hours without any perceived difficulty with breathing but is also remembering to take it off for sleeping. Education was provided about cleaning and maintenance for the valve. Pt voiced no further questions at this time. SLP will continue to follow up intermittently as able, but would recommend considering OP SLP f/u upon discharge particularly depending on ultimate plan after biopsy results are back.   HPI HPI: Pt is a 46 yo male who [facility] to [facility]/15 with several days of cough and dyspnea. 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 Pt returned to [facility] ED on 11/16 and 11/18 pt underwent elective trach and laryngeal mass biopsies. CT showed asymmetric thickening of the right aryepiglottic fold and asymmetric enlargement of the right laryngeal ventricle and piriform sinus. This has progressed in the interval. 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 PMH includes: asthma, broken ribs, dyspnea, HTN      SLP Plan  Continue with current plan of care      Recommendations for follow up therapy are one component of a multi-disciplinary discharge planning process, led by the attending physician.  Recommendations may be updated based on patient  status, additional functional criteria and insurance authorization.    Recommendations  Diet recommendations: Regular;Thin liquid Liquids provided via: Cup;Straw Medication Administration: Whole meds with liquid Supervision: Patient able to self feed;Intermittent supervision to cue for compensatory strategies Compensations: Slow rate;Small sips/bites;Clear throat intermittently;Other (Comment) (wear PMV) Postural Changes and/or Swallow Maneuvers: Seated upright 90 degrees      Patient may use Passy-Muir Speech Valve: During all waking hours (remove during sleep);During PO intake/meals PMSV Supervision: Intermittent MD: Please consider changing trach tube to : Cuffless;Smaller size         Oral Care Recommendations: Oral care BID Follow Up Recommendations: Outpatient SLP Assistance recommended at discharge: Intermittent Supervision/Assistance SLP Visit Diagnosis: Dysphagia, unspecified (R13.10) Plan: Continue with current plan of care           Osie Bond., M.A. [place] Office [phone]  Secure chat preferred   06/16/2022, 12:34 PM

## 2022-06-16 NOTE — Progress Notes (Signed)
PROGRESS NOTE    Daniel Marshall  UGQ:[id number] DOB: Sep 13, 1975 DOA: 06/11/2022 PCP: Pcp, No   Brief Narrative:   46 y.o.  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 Patient underwent direct laryngoscopy and awake trach on 11/18. Observed in ICU overnight.  Patient has now been transferred to [place] floor, doing well.  Ongoing trach management by ENT     Assessment & Plan:  Principal Problem:   Laryngeal mass Active Problems:   Shortness of breath   Neck mass     Assessment and Plan: * Laryngeal mass S/p laryngoscopy biopsy and trach placement on 11/18.  Ongoing trach management by ENT.  Plans to change to cuffless on 11/24 with new soft collar.  CT chest with contrast does not show any evidence of further metastatic disease in the chest. Speech continues to follow her  COPD Tobacco Use -prn bronchodilators. Counseled to quit using.   Essential HTN -started Losartan '50mg'$  po daily in ICU, IV prn ordered.   Hypokalemia - Repletion    Transfer to MedSurg PT/OT Speech and swallow-regular thin liquid   DVT prophylaxis: SCDs Start: 06/11/22 2348 Code Status: Full Family Communication:    Status is: Inpatient Remains inpatient appropriate because: Cont hosp stay intil cleared by ENT, sometime later this week- 11/24     Subjective: No complaints doing ok.  Examination: Constitutional: Not in acute distress Respiratory: Clear to auscultation bilaterally Cardiovascular: Normal sinus rhythm, no rubs Abdomen: Nontender nondistended good bowel sounds Musculoskeletal: No edema noted Skin: No rashes seen Neurologic: CN 2-12 grossly intact.  And nonfocal Psychiatric: Normal judgment and insight. Alert and oriented x 3. Normal mood.     Trach in place.   Objective: Vitals:    06/15/22 2019 06/15/22 2041 06/16/22 0214 06/16/22 0543  BP: 136/80   120/74  Pulse: 100 98 91 98  Resp: '18 16 18 18  '$ Temp: 98.3 F (36.8 C)   98.6 F (37 C)  TempSrc: Oral   Oral  SpO2: 98% 99% 98% 100%  Weight:      Height:        Intake/Output Summary (Last 24 hours) at 06/16/2022 0847 Last data filed at 06/16/2022 0630 Gross per 24 hour  Intake 1020 ml  Output --  Net 1020 ml    Filed Weights   06/11/22 1315  Weight: 67.6 kg     Data Reviewed:   CBC: Recent Labs  Lab 06/10/22 0125 06/11/22 1348 06/12/22 0508 06/16/22 0612  WBC 8.7 10.6* 6.9 9.8  NEUTROABS 5.8 8.1*  --   --   HGB 14.8 14.2 14.1 11.9*  HCT 44.0 41.8 40.1 34.1*  MCV 87.6 87.4 85.5 85.7  PLT 190 230 217 882   Basic Metabolic Panel: Recent Labs  Lab 06/10/22 0125 06/11/22 1348 06/16/22 0612  NA 131* 131* 134*  K 3.5 3.8 3.1*  CL 91* 94* 96*  CO2 '26 27 28  '$ GLUCOSE 118* 86 157*  BUN '10 7 8  '$ CREATININE 0.74 0.60* 0.67  CALCIUM 9.3 8.7* 8.5*  MG  --   --  1.9   GFR: Estimated Creatinine Clearance: 103 mL/min (by C-G formula based on SCr of 0.67 mg/dL). Liver Function Tests: Recent Labs  Lab 06/11/22 1348  AST 67*  ALT 31  ALKPHOS 74  BILITOT 0.6  PROT 7.8  ALBUMIN 3.5   No results for input(s): "LIPASE", "AMYLASE" in the last 168 hours. No results for input(s): "AMMONIA" in the last 168 hours. Coagulation Profile: No results for input(s): "INR", "PROTIME" in the last 168 hours. Cardiac Enzymes: No results for input(s): "CKTOTAL", "CKMB", "CKMBINDEX", "TROPONINI" in the last 168 hours. BNP (last 3 results) No results for input(s): "PROBNP" in the last 8760 hours. HbA1C: No results for input(s): "HGBA1C" in the last 72 hours. CBG: Recent Labs  Lab 06/13/22 2242 06/14/22 0350  GLUCAP 98 91   Lipid Profile: No results for input(s): "CHOL", "HDL", "LDLCALC", "TRIG", "CHOLHDL", "LDLDIRECT" in the last 72 hours. Thyroid Function Tests: No results for input(s): "TSH",  "T4TOTAL", "FREET4", "T3FREE", "THYROIDAB" in the last 72 hours. Anemia Panel: No results for input(s): "VITAMINB12", "FOLATE", "FERRITIN", "TIBC", "IRON", "RETICCTPCT" in the last 72 hours. Sepsis Labs: No results for input(s): "PROCALCITON", "LATICACIDVEN" in the last 168 hours.  Recent Results (from the past 240 hour(s))  Resp Panel by RT-PCR (Flu A&B, Covid) Anterior Nasal Swab     Status: None   Collection Time: 06/10/22  1:36 AM   Specimen: Anterior Nasal Swab  Result Value Ref Range Status   SARS Coronavirus 2 by RT PCR NEGATIVE NEGATIVE Final    Comment: (NOTE) SARS-CoV-2 target nucleic acids are NOT DETECTED.  The SARS-CoV-2 RNA is generally detectable in upper respiratory specimens during the acute phase of infection. The lowest concentration of SARS-CoV-2 viral copies this assay can detect is 138 copies/mL. A negative result does not preclude SARS-Cov-2 infection and should not be used as the sole basis for treatment or other patient management decisions. A negative result may occur with  improper specimen collection/handling, submission of specimen other than nasopharyngeal swab, presence of viral mutation(s) within the areas targeted by this assay, and inadequate number of viral copies(<138 copies/mL). A negative result must be combined with clinical observations, patient history, and epidemiological information. The expected result is Negative.  Fact Sheet for Patients:  EntrepreneurPulse.com.au  Fact Sheet for Healthcare Providers:  IncredibleEmployment.be  This test is no t yet approved or cleared by the [place] FDA and  has been authorized for detection and/or diagnosis of SARS-CoV-2 by FDA under an Emergency Use Authorization (EUA). This EUA will remain  in effect (meaning this test can be used) for the duration of the COVID-19 declaration under Section 564(b)(1) of the Act, 21 U.S.C.section 360bbb-3(b)(1), unless the  authorization is terminated  or revoked sooner.       Influenza A by PCR NEGATIVE NEGATIVE Final   Influenza B by PCR NEGATIVE NEGATIVE Final    Comment: (NOTE) The Xpert Xpress SARS-CoV-2/FLU/RSV plus assay is intended as an aid in the diagnosis of influenza from Nasopharyngeal swab specimens and should not be used as a sole basis for treatment. Nasal washings and aspirates are unacceptable for Xpert Xpress SARS-CoV-2/FLU/RSV testing.  Fact Sheet for Patients: EntrepreneurPulse.com.au  Fact Sheet for Healthcare Providers: IncredibleEmployment.be  This test is not yet approved or cleared by the [place] FDA and has been authorized for detection and/or diagnosis of SARS-CoV-2 by FDA under an Emergency Use Authorization (EUA). This EUA will remain in effect (meaning this test can be used) for the duration of the COVID-19 declaration under Section 564(b)(1) of the Act, 21 U.S.C. section 360bbb-3(b)(1), unless the authorization is terminated or revoked.  Performed at [facility], [place] [address], [place], [place] [address]   MRSA Next Gen by PCR, Nasal     Status:  None   Collection Time: 06/10/22  5:08 AM   Specimen: Nasal Mucosa; Nasal Swab  Result Value Ref Range Status   MRSA by PCR Next Gen NOT DETECTED NOT DETECTED Final    Comment: (NOTE) The GeneXpert MRSA Assay (FDA approved for NASAL specimens only), is one component of a comprehensive MRSA colonization surveillance program. It is not intended to diagnose MRSA infection nor to guide or monitor treatment for MRSA infections. Test performance is not FDA approved in patients less than 55 years old. Performed at [facility], [place] [address], [place], [place] [address]   Respiratory (~20 pathogens) panel by PCR     Status: None   Collection Time: 06/10/22 11:59 AM   Specimen: Nasopharyngeal Swab; Respiratory  Result Value Ref Range Status    Adenovirus NOT DETECTED NOT DETECTED Final   Coronavirus 229E NOT DETECTED NOT DETECTED Final    Comment: (NOTE) The Coronavirus on the Respiratory Panel, DOES NOT test for the novel  Coronavirus (2019 nCoV)    Coronavirus HKU1 NOT DETECTED NOT DETECTED Final   Coronavirus NL63 NOT DETECTED NOT DETECTED Final   Coronavirus OC43 NOT DETECTED NOT DETECTED Final   Metapneumovirus NOT DETECTED NOT DETECTED Final   Rhinovirus / Enterovirus NOT DETECTED NOT DETECTED Final   Influenza A NOT DETECTED NOT DETECTED Final   Influenza B NOT DETECTED NOT DETECTED Final   Parainfluenza Virus 1 NOT DETECTED NOT DETECTED Final   Parainfluenza Virus 2 NOT DETECTED NOT DETECTED Final   Parainfluenza Virus 3 NOT DETECTED NOT DETECTED Final   Parainfluenza Virus 4 NOT DETECTED NOT DETECTED Final   Respiratory Syncytial Virus NOT DETECTED NOT DETECTED Final   Bordetella pertussis NOT DETECTED NOT DETECTED Final   Bordetella Parapertussis NOT DETECTED NOT DETECTED Final   Chlamydophila pneumoniae NOT DETECTED NOT DETECTED Final   Mycoplasma pneumoniae NOT DETECTED NOT DETECTED Final    Comment: Performed at [facility], [place]. [address], [place], [place] [address]         Radiology Studies: DG Swallowing Func-Speech Pathology  Result Date: 06/15/2022 Table formatting from the original result was not included. Images from the original result were not included. Objective Swallowing Evaluation: Type of Study: MBS-Modified Barium Swallow Study  Patient Details Name: TOMASZ STEEVES MRN: [id number] Date of Birth: 1976/07/01 Today's Date: 06/15/2022 Time: SLP Start Time (ACUTE ONLY): 1212 -SLP Stop Time (ACUTE ONLY): 1225 SLP Time Calculation (min) (ACUTE ONLY): 13 min Past Medical History: Past Medical History: Diagnosis Date  Asthma   as a child  Broken ribs   Dyspnea   Hypertension  Past Surgical History: Past Surgical History: Procedure Laterality Date  DIRECT LARYNGOSCOPY N/A 06/13/2022  Procedure: DIRECT  LARYNGOSCOPY WITH BIOPSY;  Surgeon: Jenetta Downer, MD;  Location: [place];  Service: ENT;  Laterality: N/A;  NO PAST SURGERIES    TRACHEOSTOMY TUBE PLACEMENT N/A 06/13/2022  Procedure: AWAKE TRACHEOSTOMY;  Surgeon: Jenetta Downer, MD;  Location: [place];  Service: ENT;  Laterality: N/A; HPI: Pt is a 46 yo male who presented to [facility] 11/15 with several days of cough and dyspnea. 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 Pt returned to [facility] ED on 11/16 and 11/18 pt underwent elective trach and laryngeal mass biopsies. CT showed asymmetric thickening of the right aryepiglottic fold and asymmetric enlargement of the right laryngeal ventricle and piriform sinus. This has progressed in the interval. 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 PMH includes: asthma, broken ribs, dyspnea, HTN  Subjective: alert and cooperative  Recommendations for follow up therapy are one component of a multi-disciplinary discharge planning process, led by the attending physician.  Recommendations may be updated based on patient status, additional functional criteria and insurance authorization. Assessment / Plan / Recommendation   06/15/2022   3:00 PM Clinical Impressions Clinical Impression Pt has a mild pharyngeal dysphagia with reduced base of tongue retraction and epiglottic inversion. There is mild vallecular residue, noted most with more solid foods. Laryngeal vestibule closure is relatively intact considering location of mass. Penetration occurred with thin liquids before the swallow, but it was trace. What did not clear spontaneously was cleared easily with a cued throat clear. Recommend continuing with regular diet and thin liquids with use of PMV  for any PO intake and use of intermittent throat clear. SLP Visit Diagnosis Dysphagia, unspecified (R13.10) Impact on safety and function Mild aspiration risk     06/15/2022   3:00 PM Treatment Recommendations Treatment  Recommendations Therapy as outlined in treatment plan below     06/15/2022   3:00 PM Prognosis Prognosis for Safe Diet Advancement Good   06/15/2022   3:00 PM Diet Recommendations SLP Diet Recommendations Regular solids;Thin liquid Liquid Administration via Cup;Straw Medication Administration Whole meds with liquid Compensations Slow rate;Small sips/bites;Clear throat intermittently Postural Changes Seated upright at 90 degrees     06/15/2022   3:00 PM Other Recommendations Oral Care Recommendations Oral care BID Other Recommendations Place PMSV during PO intake Follow Up Recommendations Outpatient SLP Functional Status Assessment Patient has had a recent decline in their functional status and demonstrates the ability to make significant improvements in function in a reasonable and predictable amount of time.   06/15/2022   3:00 PM Frequency and Duration  Speech Therapy Frequency (ACUTE ONLY) min 2x/week Treatment Duration 2 weeks      No data to display       06/15/2022   3:00 PM Pharyngeal Phase Pharyngeal Phase Impaired Pharyngeal- Thin Teaspoon Reduced epiglottic inversion;Reduced anterior laryngeal mobility;Reduced tongue base retraction;Pharyngeal residue - valleculae Pharyngeal- Thin Cup Reduced epiglottic inversion;Reduced anterior laryngeal mobility;Reduced tongue base retraction;Pharyngeal residue - valleculae Pharyngeal- Thin Straw Reduced epiglottic inversion;Reduced anterior laryngeal mobility;Reduced tongue base retraction;Pharyngeal residue - valleculae;Penetration/Aspiration before swallow Pharyngeal Material enters airway, remains ABOVE vocal cords and not ejected out Pharyngeal- Puree Reduced anterior laryngeal mobility;Reduced tongue base retraction;Pharyngeal residue - valleculae Pharyngeal- Regular Reduced anterior laryngeal mobility;Reduced tongue base retraction;Pharyngeal residue - valleculae Pharyngeal- Pill Reduced anterior laryngeal mobility;Reduced tongue base retraction;Pharyngeal  residue - valleculae    06/15/2022   3:00 PM Cervical Esophageal Phase  Cervical Esophageal Phase [facility] Osie Bond., M.A. [place] [facility] Office [phone] Secure chat preferred 06/15/2022, 3:17 PM                     CT CHEST W CONTRAST  Result Date: 06/14/2022 CLINICAL DATA:  Head neck cancer, for staging EXAM: CT CHEST WITH CONTRAST TECHNIQUE: Multidetector CT imaging of the chest was performed during intravenous contrast administration. RADIATION DOSE REDUCTION: This exam was performed according to the departmental dose-optimization program which includes automated exposure control, adjustment of the mA and/or kV according to patient size and/or use of iterative reconstruction technique. CONTRAST:  53m OMNIPAQUE IOHEXOL 350 MG/ML SOLN COMPARISON:  Chest radiograph dated 06/13/2022. CTA chest dated 01/06/2022. FINDINGS: Cardiovascular: Heart is normal in size.  No pericardial effusion. No evidence of thoracic aortic aneurysm. Mediastinum/Nodes: No suspicious mediastinal lymphadenopathy.  Retrosternal fluid/gas, likely reflecting postprocedural changes in the setting of recent tracheostomy. Visualized thyroid is unremarkable. Lungs/Pleura: Tracheostomy in satisfactory position. Mild centrilobular emphysematous changes, upper lung predominant. No focal consolidation. 3 mm nodule at the left lung apex (series 4/image 32), stable versus mildly improved. No new/suspicious pulmonary nodules. No pleural effusion or pneumothorax. Upper Abdomen: Visualized upper abdomen is grossly unremarkable. Musculoskeletal: Visualized osseous structures are within normal limits. IMPRESSION: Tracheostomy in satisfactory position. Associated postprocedural changes. No findings suspicious for metastatic disease in the chest. 3 mm nodule at the left lung apex, stable versus mildly improved, benign. No follow-up is recommended. Emphysema (ICD10-J43.9). Electronically Signed   By: Julian Hy M.D.   On:  06/14/2022 21:55        Scheduled Meds:  losartan  50 mg Oral Daily   potassium chloride  40 mEq Oral Q4H   Continuous Infusions:   LOS: 5 days   Time spent= 35 mins    Karisma Meiser Arsenio Loader, MD [facility]  If 7PM-7AM, please contact night-coverage  06/16/2022, 8:47 AM

## 2022-06-16 NOTE — Progress Notes (Signed)
Spoke with patient's significant other this morning.  Had her scheduled to come at 09:30 am.  She called back and needed to cancel due to work issues.  She has agreed for hands on training 08:00 am 06/17/2022.

## 2022-06-16 NOTE — Progress Notes (Signed)
Mobility Specialist - Progress Note   06/16/22 1139  Mobility  Activity Ambulated independently in hallway  Level of Assistance Independent  Assistive Device None  Distance Ambulated (ft) 300 ft  Activity Response Tolerated well  $Mobility charge 1 Mobility    Pt received in recliner agreeable to mobility. No complaints throughout, no physical assistance needed. Left in recliner w/ call bell in reach and all needs met.   [place] Specialist Please contact via SecureChat or Rehab office at [phone]

## 2022-06-17 LAB — CBC
HCT: 33.7 % — ABNORMAL LOW (ref 39.0–52.0)
Hemoglobin: 11.8 g/dL — ABNORMAL LOW (ref 13.0–17.0)
MCH: 30.5 pg (ref 26.0–34.0)
MCHC: 35 g/dL (ref 30.0–36.0)
MCV: 87.1 fL (ref 80.0–100.0)
Platelets: 219 10*3/uL (ref 150–400)
RBC: 3.87 MIL/uL — ABNORMAL LOW (ref 4.22–5.81)
RDW: 14.6 % (ref 11.5–15.5)
WBC: 8.4 10*3/uL (ref 4.0–10.5)
nRBC: 0 % (ref 0.0–0.2)

## 2022-06-17 LAB — BASIC METABOLIC PANEL
Anion gap: 13 (ref 5–15)
BUN: 8 mg/dL (ref 6–20)
CO2: 23 mmol/L (ref 22–32)
Calcium: 8.7 mg/dL — ABNORMAL LOW (ref 8.9–10.3)
Chloride: 98 mmol/L (ref 98–111)
Creatinine, Ser: 0.66 mg/dL (ref 0.61–1.24)
GFR, Estimated: >60 mL/min (ref >60)
Glucose, Bld: 142 mg/dL — ABNORMAL HIGH (ref 70–99)
Potassium: 3.7 mmol/L (ref 3.5–5.1)
Sodium: 134 mmol/L — ABNORMAL LOW (ref 135–145)

## 2022-06-17 LAB — MAGNESIUM: Magnesium: 1.9 mg/dL (ref 1.7–2.4)

## 2022-06-17 MED ORDER — POTASSIUM CHLORIDE CRYS ER 20 MEQ PO TBCR
40.0000 meq | EXTENDED_RELEASE_TABLET | Freq: Once | ORAL | Status: AC
  Administered 2022-06-17: 40 meq via ORAL
  Filled 2022-06-17: qty 2

## 2022-06-17 NOTE — Progress Notes (Signed)
PROGRESS NOTE    Daniel Marshall  ZYS:[id number] DOB: 1976/05/01 DOA: 06/11/2022 PCP: Pcp, No   Brief Narrative:   46 y.o.  male with longstanding history of tobacco use-who was just admitted to [place] for shortness of breath-was found to have a laryngeal mass-but signed out AMA-presented to [facility] ED-and was subsequently admitted to the hospitalist service. Patient underwent direct laryngoscopy and awake trach on 11/18. Observed in ICU overnight.  Patient has now been transferred to [place] floor, doing well.  Ongoing trach management by ENT     Assessment & Plan:  Principal Problem:   Laryngeal mass Active Problems:   Shortness of breath   Neck mass     Assessment and Plan: * Laryngeal mass S/p laryngoscopy biopsy and trach placement on 11/18.  Ongoing trach management by ENT.  Plans to change to cuffless on 11/24 with new soft collar.  CT chest with contrast does not show any evidence of further metastatic disease in the chest. Speech is following.   COPD Tobacco Use -prn bronchodilators. Counseled to quit using.   Essential HTN -started Losartan '50mg'$  po daily in ICU, IV prn ordered.   Hypokalemia - Repletion    Transfer to MedSurg PT/OT Speech and swallow-regular thin liquid   DVT prophylaxis: SCDs Start: 06/11/22 2348 Code Status: Full Family Communication:    Status is: Inpatient Remains inpatient appropriate because: Cont hosp stay intil cleared by ENT, sometime later this week- 11/24. Should be able to go after that.    Subjective: No complaints, doing ok.   Examination: Constitutional: Not in acute distress Respiratory: Clear to auscultation bilaterally Cardiovascular: Normal sinus rhythm, no rubs Abdomen: Nontender nondistended good bowel sounds Musculoskeletal: No edema noted Skin: No rashes seen Neurologic: CN 2-12 grossly intact.  And nonfocal Psychiatric: Normal judgment and insight. Alert and oriented x 3. Normal mood.   Trach in place.    Objective: Vitals:   06/16/22 1953 06/16/22 2013 06/17/22 0219 06/17/22 0458  BP:  138/76  116/74  Pulse: 99 96 86 76  Resp: '18 16 18 16  '$ Temp:  99.2 F (37.3 C)  98.3 F (36.8 C)  TempSrc:  Oral  Oral  SpO2: 96% 98% 96% 97%  Weight:      Height:        Intake/Output Summary (Last 24 hours) at 06/17/2022 0747 Last data filed at 06/16/2022 1846 Gross per 24 hour  Intake 800 ml  Output --  Net 800 ml    Filed Weights   06/11/22 1315  Weight: 67.6 kg     Data Reviewed:   CBC: Recent Labs  Lab 06/11/22 1348 06/12/22 0508 06/16/22 0612 06/17/22 0627  WBC 10.6* 6.9 9.8 8.4  NEUTROABS 8.1*  --   --   --   HGB 14.2 14.1 11.9* 11.8*  HCT 41.8 40.1 34.1* 33.7*  MCV 87.4 85.5 85.7 87.1  PLT 230 217 197 932   Basic Metabolic Panel: Recent Labs  Lab 06/11/22 1348 06/16/22 0612 06/17/22 0627  NA 131* 134* 134*  K 3.8 3.1* 3.7  CL 94* 96* 98  CO2 '27 28 23  '$ GLUCOSE 86 157* 142*  BUN '7 8 8  '$ CREATININE 0.60* 0.67 0.66  CALCIUM 8.7* 8.5* 8.7*  MG  --  1.9 1.9   GFR: Estimated Creatinine Clearance: 103 mL/min (by C-G formula based on SCr of 0.66 mg/dL). Liver Function Tests: Recent Labs  Lab 06/11/22 1348  AST 67*  ALT 31  ALKPHOS 74  BILITOT 0.6  PROT 7.8  ALBUMIN 3.5   No results for input(s): "LIPASE", "AMYLASE" in the last 168 hours. No results for input(s): "AMMONIA" in the last 168 hours. Coagulation Profile: No results for input(s): "INR", "PROTIME" in the last 168 hours. Cardiac Enzymes: No results for input(s): "CKTOTAL", "CKMB", "CKMBINDEX", "TROPONINI" in the last 168 hours. BNP (last 3 results) No results for input(s): "PROBNP" in the last 8760 hours. HbA1C: No results for input(s): "HGBA1C" in the last 72 hours. CBG: Recent Labs  Lab 06/13/22 2242 06/14/22 0350  GLUCAP 98 91   Lipid Profile: No results for input(s): "CHOL", "HDL", "LDLCALC", "TRIG", "CHOLHDL", "LDLDIRECT" in the last 72 hours. Thyroid Function Tests: No  results for input(s): "TSH", "T4TOTAL", "FREET4", "T3FREE", "THYROIDAB" in the last 72 hours. Anemia Panel: No results for input(s): "VITAMINB12", "FOLATE", "FERRITIN", "TIBC", "IRON", "RETICCTPCT" in the last 72 hours. Sepsis Labs: No results for input(s): "PROCALCITON", "LATICACIDVEN" in the last 168 hours.  Recent Results (from the past 240 hour(s))  Resp Panel by RT-PCR (Flu A&B, Covid) Anterior Nasal Swab     Status: None   Collection Time: 06/10/22  1:36 AM   Specimen: Anterior Nasal Swab  Result Value Ref Range Status   SARS Coronavirus 2 by RT PCR NEGATIVE NEGATIVE Final    Comment: (NOTE) SARS-CoV-2 target nucleic acids are NOT DETECTED.  The SARS-CoV-2 RNA is generally detectable in upper respiratory specimens during the acute phase of infection. The lowest concentration of SARS-CoV-2 viral copies this assay can detect is 138 copies/mL. A negative result does not preclude SARS-Cov-2 infection and should not be used as the sole basis for treatment or other patient management decisions. A negative result may occur with  improper specimen collection/handling, submission of specimen other than nasopharyngeal swab, presence of viral mutation(s) within the areas targeted by this assay, and inadequate number of viral copies(<138 copies/mL). A negative result must be combined with clinical observations, patient history, and epidemiological information. The expected result is Negative.  Fact Sheet for Patients:  EntrepreneurPulse.com.au  Fact Sheet for Healthcare Providers:  IncredibleEmployment.be  This test is no t yet approved or cleared by the [place] FDA and  has been authorized for detection and/or diagnosis of SARS-CoV-2 by FDA under an Emergency Use Authorization (EUA). This EUA will remain  in effect (meaning this test can be used) for the duration of the COVID-19 declaration under Section 564(b)(1) of the Act, 21 U.S.C.section  360bbb-3(b)(1), unless the authorization is terminated  or revoked sooner.       Influenza A by PCR NEGATIVE NEGATIVE Final   Influenza B by PCR NEGATIVE NEGATIVE Final    Comment: (NOTE) The Xpert Xpress SARS-CoV-2/FLU/RSV plus assay is intended as an aid in the diagnosis of influenza from Nasopharyngeal swab specimens and should not be used as a sole basis for treatment. Nasal washings and aspirates are unacceptable for Xpert Xpress SARS-CoV-2/FLU/RSV testing.  Fact Sheet for Patients: EntrepreneurPulse.com.au  Fact Sheet for Healthcare Providers: IncredibleEmployment.be  This test is not yet approved or cleared by the [place] FDA and has been authorized for detection and/or diagnosis of SARS-CoV-2 by FDA under an Emergency Use Authorization (EUA). This EUA will remain in effect (meaning this test can be used) for the duration of the COVID-19 declaration under Section 564(b)(1) of the Act, 21 U.S.C. section 360bbb-3(b)(1), unless the authorization is terminated or revoked.  Performed at [facility], [place] [address] [place], [place] [address]   MRSA Next Gen by PCR, Nasal  Status: None   Collection Time: 06/10/22  5:08 AM   Specimen: Nasal Mucosa; Nasal Swab  Result Value Ref Range Status   MRSA by PCR Next Gen NOT DETECTED NOT DETECTED Final    Comment: (NOTE) The GeneXpert MRSA Assay (FDA approved for NASAL specimens only), is one component of a comprehensive MRSA colonization surveillance program. It is not intended to diagnose MRSA infection nor to guide or monitor treatment for MRSA infections. Test performance is not FDA approved in patients less than 62 years old. Performed at [facility], [place] [address], [place], [place] [address]   Respiratory (~20 pathogens) panel by PCR     Status: None   Collection Time: 06/10/22 11:59 AM   Specimen: Nasopharyngeal Swab; Respiratory  Result  Value Ref Range Status   Adenovirus NOT DETECTED NOT DETECTED Final   Coronavirus 229E NOT DETECTED NOT DETECTED Final    Comment: (NOTE) The Coronavirus on the Respiratory Panel, DOES NOT test for the novel  Coronavirus (2019 nCoV)    Coronavirus HKU1 NOT DETECTED NOT DETECTED Final   Coronavirus NL63 NOT DETECTED NOT DETECTED Final   Coronavirus OC43 NOT DETECTED NOT DETECTED Final   Metapneumovirus NOT DETECTED NOT DETECTED Final   Rhinovirus / Enterovirus NOT DETECTED NOT DETECTED Final   Influenza A NOT DETECTED NOT DETECTED Final   Influenza B NOT DETECTED NOT DETECTED Final   Parainfluenza Virus 1 NOT DETECTED NOT DETECTED Final   Parainfluenza Virus 2 NOT DETECTED NOT DETECTED Final   Parainfluenza Virus 3 NOT DETECTED NOT DETECTED Final   Parainfluenza Virus 4 NOT DETECTED NOT DETECTED Final   Respiratory Syncytial Virus NOT DETECTED NOT DETECTED Final   Bordetella pertussis NOT DETECTED NOT DETECTED Final   Bordetella Parapertussis NOT DETECTED NOT DETECTED Final   Chlamydophila pneumoniae NOT DETECTED NOT DETECTED Final   Mycoplasma pneumoniae NOT DETECTED NOT DETECTED Final    Comment: Performed at [facility], [place]. [address] [place], [place] [address]         Radiology Studies: DG Swallowing Func-Speech Pathology  Result Date: 06/15/2022 Table formatting from the original result was not included. Images from the original result were not included. Objective Swallowing Evaluation: Type of Study: MBS-Modified Barium Swallow Study  Patient Details Name: Daniel Marshall MRN: [id number] Date of Birth: [birth year] Today's Date: 06/15/2022 Time: SLP Start Time (ACUTE ONLY): 1212 -SLP Stop Time (ACUTE ONLY): 1225 SLP Time Calculation (min) (ACUTE ONLY): 13 min Past Medical History: Past Medical History: Diagnosis Date  Asthma   as a child  Broken ribs   Dyspnea   Hypertension  Past Surgical History: Past Surgical History: Procedure Laterality Date  DIRECT LARYNGOSCOPY N/A  06/13/2022  Procedure: DIRECT LARYNGOSCOPY WITH BIOPSY;  Surgeon: Jenetta Downer, MD;  Location: [place];  Service: ENT;  Laterality: N/A;  NO PAST SURGERIES    TRACHEOSTOMY TUBE PLACEMENT N/A 06/13/2022  Procedure: AWAKE TRACHEOSTOMY;  Surgeon: Jenetta Downer, MD;  Location: [place];  Service: ENT;  Laterality: N/A; HPI: Pt is a 46 yo male who presented to [facility] 11/15 with several days of cough and dyspnea. 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 Pt returned to [facility] ED on 11/16 and 11/18 pt underwent elective trach and laryngeal mass biopsies. CT showed asymmetric thickening of the right aryepiglottic fold and asymmetric enlargement of the right laryngeal ventricle and piriform sinus. This has progressed in the interval. 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 PMH includes: asthma, broken ribs, dyspnea, HTN  Subjective: alert and cooperative  Recommendations for follow up therapy are one component of a multi-disciplinary discharge planning process, led by the attending physician.  Recommendations may be updated based on patient status, additional functional criteria and insurance authorization. Assessment / Plan / Recommendation   06/15/2022   3:00 PM Clinical Impressions Clinical Impression Pt has a mild pharyngeal dysphagia with reduced base of tongue retraction and epiglottic inversion. There is mild vallecular residue, noted most with more solid foods. Laryngeal vestibule closure is relatively intact considering location of mass. Penetration occurred with thin liquids before the swallow, but it was trace. What did not clear spontaneously was cleared easily with a cued throat clear. Recommend continuing with regular diet and thin liquids with use of PMV  for any PO intake and use of intermittent throat clear. SLP Visit Diagnosis Dysphagia, unspecified (R13.10) Impact on safety and function Mild aspiration risk     06/15/2022   3:00 PM Treatment  Recommendations Treatment Recommendations Therapy as outlined in treatment plan below     06/15/2022   3:00 PM Prognosis Prognosis for Safe Diet Advancement Good   06/15/2022   3:00 PM Diet Recommendations SLP Diet Recommendations Regular solids;Thin liquid Liquid Administration via Cup;Straw Medication Administration Whole meds with liquid Compensations Slow rate;Small sips/bites;Clear throat intermittently Postural Changes Seated upright at 90 degrees     06/15/2022   3:00 PM Other Recommendations Oral Care Recommendations Oral care BID Other Recommendations Place PMSV during PO intake Follow Up Recommendations Outpatient SLP Functional Status Assessment Patient has had a recent decline in their functional status and demonstrates the ability to make significant improvements in function in a reasonable and predictable amount of time.   06/15/2022   3:00 PM Frequency and Duration  Speech Therapy Frequency (ACUTE ONLY) min 2x/week Treatment Duration 2 weeks      No data to display       06/15/2022   3:00 PM Pharyngeal Phase Pharyngeal Phase Impaired Pharyngeal- Thin Teaspoon Reduced epiglottic inversion;Reduced anterior laryngeal mobility;Reduced tongue base retraction;Pharyngeal residue - valleculae Pharyngeal- Thin Cup Reduced epiglottic inversion;Reduced anterior laryngeal mobility;Reduced tongue base retraction;Pharyngeal residue - valleculae Pharyngeal- Thin Straw Reduced epiglottic inversion;Reduced anterior laryngeal mobility;Reduced tongue base retraction;Pharyngeal residue - valleculae;Penetration/Aspiration before swallow Pharyngeal Material enters airway, remains ABOVE vocal cords and not ejected out Pharyngeal- Puree Reduced anterior laryngeal mobility;Reduced tongue base retraction;Pharyngeal residue - valleculae Pharyngeal- Regular Reduced anterior laryngeal mobility;Reduced tongue base retraction;Pharyngeal residue - valleculae Pharyngeal- Pill Reduced anterior laryngeal mobility;Reduced tongue base  retraction;Pharyngeal residue - valleculae    06/15/2022   3:00 PM Cervical Esophageal Phase  Cervical Esophageal Phase [facility] Osie Bond., M.A. CCC-SLP Acute Rehabilitation Services Office [phone] Secure chat preferred 06/15/2022, 3:17 PM                          Scheduled Meds:  losartan  50 mg Oral Daily   Continuous Infusions:   LOS: 6 days   Time spent= 35 mins    Errin Chewning Arsenio Loader, MD [facility]  If 7PM-7AM, please contact night-coverage  06/17/2022, 7:47 AM

## 2022-06-17 NOTE — Progress Notes (Signed)
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 DME ordered - some delays with supply delivery anticipated, may prolong discharge. Patient with no complaints.  Surgical path demonstrating squamous cell carcinoma   O: 6-0 Tracheostomy secured with silk sutures and ties. Hemostatic. Cuff down. PMV in place.  IMAGING: CT Chest with contrast 06/14/22  IMPRESSION: Tracheostomy in satisfactory position. Associated postprocedural changes.   No findings suspicious for metastatic disease in the chest.   3 mm nodule at the left lung apex, stable versus mildly improved, benign. No follow-up is recommended.   Emphysema (ICD10-J43.9).     Electronically Signed   By: Julian Hy M.D.   On: 06/14/2022 21:55   CT neck with contrast 06/10/22  IMPRESSION: 1. Image quality degraded by extensive motion. 2. Asymmetric thickening of the right aryepiglottic fold and asymmetric enlargement of the right laryngeal ventricle and piriform sinus. This has progressed in the interval. 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 Recommend laryngoscopy. 3. No tonsillar abscess. No adenopathy in the neck. 4. Mild atherosclerotic disease in the carotid artery bilaterally.     Electronically Signed   By: Franchot Gallo M.D.   On: 06/10/2022 13:38  Surgical path 06/13/22:  Y SURGICAL PATHOLOGY CASE: [id number] PATIENT: Daniel Marshall Surgical Pathology Report     Clinical History: laryngeal mass (cm)     FINAL MICROSCOPIC DIAGNOSIS:  A.   LARYNGEAL MASS, RIGHT, BIOPSY: -    Invasive squamous cell carcinoma, keratinizing.    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 Doing well. Awaiting first tracheostomy change.  - Please order humidified trach mist while in bed to reduce crusting/thick mucous - Given diagnosis of SCC  added patient to [facility] Multi-D tumor board. Referrals sent to [facility] for Laryngectomy, as well as referrals to local Rad-Onc / Med-onc to discuss CRT as an alternative therapy - Tracheostomy change POD#6 (06/19/22); please have Shiley 6-0 cuffless tracheostomy at bedside and new soft collar. Please work with DME / discharge planning to order appropriate tracheostomy supplies for patient upon discharge at the end of the week.   Electronically signed by:  Jenetta Downer, MD  Staff Physician Facial Plastic & Reconstructive Surgery Otolaryngology - Head and Neck Surgery [place], [place]

## 2022-06-17 NOTE — Progress Notes (Signed)
RT at bedside to provide trach education to patient and his  significant other  [place]. RT was able to teach them  both how to properly suction pt's trach, how to remove and replace inner cannula, along with removing and replacing speaking valve. RT was able to observe them perform each task. They both stated they felt ok doing so and will continue to do tasks with nurse and RT observation until discharged.  RT believes pt can perform  some of the task properly.  Discussed emergency situations with patient and significant other. Pt is willing and able to assist with his trach care. Discussed importance of wearing trach collar humidification. And proper way for bathing or taking a shower. Discussed how to change trach collar and  the importance of it being done by 2 people.  Tach education booklet and trach clinic information given to patient and significant other.

## 2022-06-18 LAB — MAGNESIUM: Magnesium: 2 mg/dL (ref 1.7–2.4)

## 2022-06-18 LAB — BASIC METABOLIC PANEL
Anion gap: 7 (ref 5–15)
BUN: 8 mg/dL (ref 6–20)
CO2: 27 mmol/L (ref 22–32)
Calcium: 9.1 mg/dL (ref 8.9–10.3)
Chloride: 101 mmol/L (ref 98–111)
Creatinine, Ser: 0.79 mg/dL (ref 0.61–1.24)
GFR, Estimated: >60 mL/min (ref >60)
Glucose, Bld: 93 mg/dL (ref 70–99)
Potassium: 4.3 mmol/L (ref 3.5–5.1)
Sodium: 135 mmol/L (ref 135–145)

## 2022-06-18 LAB — CBC
HCT: 36.9 % — ABNORMAL LOW (ref 39.0–52.0)
Hemoglobin: 13 g/dL (ref 13.0–17.0)
MCH: 30.5 pg (ref 26.0–34.0)
MCHC: 35.2 g/dL (ref 30.0–36.0)
MCV: 86.6 fL (ref 80.0–100.0)
Platelets: 264 10*3/uL (ref 150–400)
RBC: 4.26 MIL/uL (ref 4.22–5.81)
RDW: 14.6 % (ref 11.5–15.5)
WBC: 8.8 10*3/uL (ref 4.0–10.5)
nRBC: 0 % (ref 0.0–0.2)

## 2022-06-18 NOTE — Progress Notes (Signed)
PROGRESS NOTE    Daniel Marshall  HCW:[id number] DOB: 10-Aug-1975 DOA: 06/11/2022 PCP: Pcp, No   Brief Narrative:   46 y.o.  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 Patient underwent direct laryngoscopy and awake trach on 11/18. Observed in ICU overnight.  Patient has now been transferred to [place] floor, doing well.  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 S/p laryngoscopy biopsy and trach placement on 11/18.  Ongoing trach management by ENT.  Changed to cuffless on 11/23.    Seen by speech: Diet recommendations: Regular;Thin liquid Liquids provided via: Cup;Straw Medication Administration: Whole meds with liquid Supervision: Patient able to self feed;Intermittent supervision to cue for compensatory strategies Compensations: Slow rate;Small sips/bites;Clear throat intermittently;Other (Comment) (wear PMV) Postural Changes and/or Swallow Maneuvers: Seated upright 90 degrees          Patient may use Passy-Muir Speech Valve: During all waking hours (remove during sleep);During PO intake/meals PMSV Supervision: Intermittent MD: Please consider changing trach tube to : Cuffless;Smaller size    COPD Tobacco Use -no wheezing, prn bronchodilators. Counseled to quit using.   Essential HTN -started Losartan '50mg'$  po daily in ICU, IV prn ordered.   Hypokalemia - Repletion    DVT prophylaxis: SCDs Start: 06/11/22 2348 Code Status: Full Family Communication:  none at bedside   Remains inpatient appropriate because: possible d/c on 11/24 if cleared by  ENT, need suction device delivered to home prior to d/c.    Subjective: No complaints, doing ok.   Examination: Constitutional: Not in acute distress, + trach with Passy-Muir on Respiratory: Clear to auscultation bilaterally Cardiovascular: Normal sinus rhythm, no rubs Abdomen: Nontender nondistended good bowel sounds Musculoskeletal: No edema noted Skin: No rashes seen Neurologic: CN 2-12 grossly intact.  And nonfocal Psychiatric: Normal judgment and insight. Alert and oriented x 3. Normal mood.   Trach in place.   Objective: Vitals:   06/17/22 2340 06/18/22 0403 06/18/22 0600 06/18/22 0832  BP:   129/85 125/87  Pulse:   90 87  Resp:    17  Temp:   98.6 F (37 C) 98.9 F (37.2 C)  TempSrc:   Oral Oral  SpO2: 96% 93% 97% 100%  Weight:      Height:        Intake/Output Summary (Last 24 hours) at 06/18/2022 0921 Last data filed at 06/18/2022 1761 Gross per 24 hour  Intake 960 ml  Output --  Net 960 ml    Filed Weights   06/11/22 1315  Weight: 67.6 kg     Data Reviewed:   CBC: Recent Labs  Lab 06/11/22 1348 06/12/22 0508 06/16/22 0612 06/17/22 0627 06/18/22 0558  WBC 10.6* 6.9 9.8 8.4 8.8  NEUTROABS 8.1*  --   --   --   --   HGB 14.2 14.1 11.9* 11.8* 13.0  HCT 41.8 40.1 34.1* 33.7* 36.9*  MCV 87.4 85.5 85.7 87.1 86.6  PLT 230 217 197 219 607   Basic Metabolic Panel: Recent Labs  Lab 06/11/22 1348 06/16/22 0612 06/17/22 3710 06/18/22 6269  NA 131* 134* 134* 135  K 3.8 3.1* 3.7 4.3  CL 94* 96* 98 101  CO2 '27 28 23 27  '$ GLUCOSE 86 157* 142* 93  BUN '7 8 8 8  '$ CREATININE 0.60* 0.67 0.66 0.79  CALCIUM 8.7* 8.5* 8.7* 9.1  MG  --  1.9 1.9 2.0   GFR: Estimated Creatinine Clearance: 103 mL/min (by C-G formula based on SCr of 0.79 mg/dL). Liver Function Tests: Recent Labs  Lab 06/11/22 1348  AST 67*  ALT 31  ALKPHOS 74  BILITOT 0.6  PROT 7.8  ALBUMIN 3.5   No results for input(s): "LIPASE", "AMYLASE" in the last 168 hours. No results for  input(s): "AMMONIA" in the last 168 hours. Coagulation Profile: No results for input(s): "INR", "PROTIME" in the last 168 hours. Cardiac Enzymes: No results for input(s): "CKTOTAL", "CKMB", "CKMBINDEX", "TROPONINI" in the last 168 hours. BNP (last 3 results) No results for input(s): "PROBNP" in the last 8760 hours. HbA1C: No results for input(s): "HGBA1C" in the last 72 hours. CBG: Recent Labs  Lab 06/13/22 2242 06/14/22 0350  GLUCAP 98 91   Lipid Profile: No results for input(s): "CHOL", "HDL", "LDLCALC", "TRIG", "CHOLHDL", "LDLDIRECT" in the last 72 hours. Thyroid Function Tests: No results for input(s): "TSH", "T4TOTAL", "FREET4", "T3FREE", "THYROIDAB" in the last 72 hours. Anemia Panel: No results for input(s): "VITAMINB12", "FOLATE", "FERRITIN", "TIBC", "IRON", "RETICCTPCT" in the last 72 hours. Sepsis Labs: No results for input(s): "PROCALCITON", "LATICACIDVEN" in the last 168 hours.  Recent Results (from the past 240 hour(s))  Resp Panel by RT-PCR (Flu A&B, Covid) Anterior Nasal Swab     Status: None   Collection Time: 06/10/22  1:36 AM   Specimen: Anterior Nasal Swab  Result Value Ref Range Status   SARS Coronavirus 2 by RT PCR NEGATIVE NEGATIVE Final    Comment: (NOTE) SARS-CoV-2 target nucleic acids are NOT DETECTED.  The SARS-CoV-2 RNA is generally detectable in upper respiratory specimens during the acute phase of infection. The lowest concentration of SARS-CoV-2 viral copies this assay can detect is 138 copies/mL. A negative result does not preclude SARS-Cov-2 infection and should not be used as the sole basis for treatment or other patient management decisions. A negative result may occur with  improper specimen collection/handling, submission of specimen other than nasopharyngeal swab, presence of viral mutation(s) within the areas targeted by this assay, and inadequate number of viral copies(<138 copies/mL). A negative result must be combined with clinical  observations, patient history, and epidemiological information. The expected result is Negative.  Fact Sheet for Patients:  EntrepreneurPulse.com.au  Fact Sheet for Healthcare Providers:  IncredibleEmployment.be  This test is no t yet approved or cleared by the [place] FDA and  has been authorized for detection and/or diagnosis of SARS-CoV-2 by FDA under an Emergency Use Authorization (EUA). This EUA will remain  in effect (meaning this test can be used) for the duration of the COVID-19 declaration under Section 564(b)(1) of the Act, 21 U.S.C.section 360bbb-3(b)(1), unless the authorization is terminated  or revoked sooner.       Influenza A by PCR NEGATIVE NEGATIVE Final   Influenza B by PCR NEGATIVE NEGATIVE Final    Comment: (NOTE) The Xpert Xpress SARS-CoV-2/FLU/RSV plus assay is intended as an aid in the diagnosis of influenza from Nasopharyngeal swab specimens and should not be used as a sole basis for treatment. Nasal washings and aspirates are unacceptable for Xpert Xpress SARS-CoV-2/FLU/RSV testing.  Fact Sheet for Patients: EntrepreneurPulse.com.au  Fact Sheet for Healthcare  Providers: IncredibleEmployment.be  This test is not yet approved or cleared by the [place] and has been authorized for detection and/or diagnosis of SARS-CoV-2 by FDA under an Emergency Use Authorization (EUA). This EUA will remain in effect (meaning this test can be used) for the duration of the COVID-19 declaration under Section 564(b)(1) of the Act, 21 U.S.C. section 360bbb-3(b)(1), unless the authorization is terminated or revoked.  Performed at [facility], [place] [address], [place], [place] [address]   MRSA Next Gen by PCR, Nasal     Status: None   Collection Time: 06/10/22  5:08 AM   Specimen: Nasal Mucosa; Nasal Swab  Result Value Ref Range Status   MRSA by PCR Next Gen NOT  DETECTED NOT DETECTED Final    Comment: (NOTE) The GeneXpert MRSA Assay (FDA approved for NASAL specimens only), is one component of a comprehensive MRSA colonization surveillance program. It is not intended to diagnose MRSA infection nor to guide or monitor treatment for MRSA infections. Test performance is not FDA approved in patients less than 54 years old. Performed at [facility], [place] [address], [place], [place] [address]   Respiratory (~20 pathogens) panel by PCR     Status: None   Collection Time: 06/10/22 11:59 AM   Specimen: Nasopharyngeal Swab; Respiratory  Result Value Ref Range Status   Adenovirus NOT DETECTED NOT DETECTED Final   Coronavirus 229E NOT DETECTED NOT DETECTED Final    Comment: (NOTE) The Coronavirus on the Respiratory Panel, DOES NOT test for the novel  Coronavirus (2019 nCoV)    Coronavirus HKU1 NOT DETECTED NOT DETECTED Final   Coronavirus NL63 NOT DETECTED NOT DETECTED Final   Coronavirus OC43 NOT DETECTED NOT DETECTED Final   Metapneumovirus NOT DETECTED NOT DETECTED Final   Rhinovirus / Enterovirus NOT DETECTED NOT DETECTED Final   Influenza A NOT DETECTED NOT DETECTED Final   Influenza B NOT DETECTED NOT DETECTED Final   Parainfluenza Virus 1 NOT DETECTED NOT DETECTED Final   Parainfluenza Virus 2 NOT DETECTED NOT DETECTED Final   Parainfluenza Virus 3 NOT DETECTED NOT DETECTED Final   Parainfluenza Virus 4 NOT DETECTED NOT DETECTED Final   Respiratory Syncytial Virus NOT DETECTED NOT DETECTED Final   Bordetella pertussis NOT DETECTED NOT DETECTED Final   Bordetella Parapertussis NOT DETECTED NOT DETECTED Final   Chlamydophila pneumoniae NOT DETECTED NOT DETECTED Final   Mycoplasma pneumoniae NOT DETECTED NOT DETECTED Final    Comment: Performed at [facility], [address] [place], [place] [address]         Radiology Studies: No results found.      Scheduled Meds:  losartan  50 mg Oral Daily    Continuous Infusions:   LOS: 7 days   Time spent= 35 mins    Florencia Reasons, MD PhD FACP [facility]  If 7PM-7AM, please contact night-coverage  06/18/2022, 9:21 AM

## 2022-06-18 NOTE — Progress Notes (Signed)
Postop day 5, doing well.  Tracheostomy changed to an uncuffed tube without difficulty.  Continue care.

## 2022-06-19 ENCOUNTER — Other Ambulatory Visit: Payer: Self-pay

## 2022-06-19 DIAGNOSIS — C329 Malignant neoplasm of larynx, unspecified: Secondary | ICD-10-CM

## 2022-06-19 LAB — BASIC METABOLIC PANEL
Anion gap: 12 (ref 5–15)
BUN: 6 mg/dL (ref 6–20)
CO2: 25 mmol/L (ref 22–32)
Calcium: 9.1 mg/dL (ref 8.9–10.3)
Chloride: 95 mmol/L — ABNORMAL LOW (ref 98–111)
Creatinine, Ser: 0.85 mg/dL (ref 0.61–1.24)
GFR, Estimated: >60 mL/min (ref >60)
Glucose, Bld: 99 mg/dL (ref 70–99)
Potassium: 4.2 mmol/L (ref 3.5–5.1)
Sodium: 132 mmol/L — ABNORMAL LOW (ref 135–145)

## 2022-06-19 LAB — CBC
HCT: 38.1 % — ABNORMAL LOW (ref 39.0–52.0)
Hemoglobin: 13.4 g/dL (ref 13.0–17.0)
MCH: 30.1 pg (ref 26.0–34.0)
MCHC: 35.2 g/dL (ref 30.0–36.0)
MCV: 85.6 fL (ref 80.0–100.0)
Platelets: 317 10*3/uL (ref 150–400)
RBC: 4.45 MIL/uL (ref 4.22–5.81)
RDW: 14.5 % (ref 11.5–15.5)
WBC: 9.3 10*3/uL (ref 4.0–10.5)
nRBC: 0 % (ref 0.0–0.2)

## 2022-06-19 LAB — MAGNESIUM: Magnesium: 1.9 mg/dL (ref 1.7–2.4)

## 2022-06-19 MED ORDER — OXYCODONE-ACETAMINOPHEN 5-325 MG PO TABS: 1.0000 | ORAL_TABLET | Freq: Three times a day (TID) | ORAL | 0 refills | Status: AC | PRN

## 2022-06-19 MED ORDER — LOSARTAN POTASSIUM 50 MG PO TABS: 50.0000 mg | ORAL_TABLET | Freq: Every day | ORAL | 0 refills | Status: DC

## 2022-06-19 NOTE — TOC Transition Note (Deleted)
Transition of Care [facility]) - CM/SW Discharge Note   Patient Details  Name: Daniel Marshall MRN: [id number] Date of Birth: [birth year]  [facility] CM/SW Contact:  Sharin Mons, RN Phone Number: 06/19/2022, 1:45 PM   Clinical Narrative:       Final next level of care: Home/Self Care Barriers to Discharge: No Barriers Identified   Patient Goals and CMS Choice Patient states their goals for this hospitalization and ongoing recovery are:: to return to home      Discharge Placement                       Discharge Plan and Services In-house Referral: Financial Counselor                DME Agency: [employer] Date DME Agency Contacted: 06/15/22 Time DME Agency Contacted: [phone] Representative spoke with at DME Agency: Erasmo Downer            Social Determinants of Health ([place]) Interventions     Readmission Risk Interventions     No data to display

## 2022-06-19 NOTE — Discharge Summary (Signed)
Discharge Summary  Daniel Marshall GYI:[id number] DOB: March 27, 1976  PCP: Pcp, No  Admit date: 06/11/2022 Discharge date: 06/19/2022    Time spent: 62mns, more than 50% time spent on coordination of care.   Recommendations for Outpatient Follow-up:  F/u with PCP within a week  for hospital discharge follow up, repeat cbc/bmp at follow up, patient is advised to check his blood pressure at home and bring in record for pcp to review F/u with ENT F/u with tracheostomy  clinic  F/u with oncology    Discharge Diagnoses:  Active Hospital Problems   Diagnosis Date Noted   Laryngeal mass 06/11/2022   Neck mass 06/13/2022   Shortness of breath 10/16/2018    Resolved Hospital Problems  No resolved problems to display.    Discharge Condition: stable  Diet recommendation: heart healthy  Filed Weights   06/11/22 1315  Weight: 67.6 kg    History of present illness: ( per admitting provider Dr TFlossie Buffy HPI: Daniel MATHERSis a 46y.o. male with medical history significant of HTN,COPD, newly diagnosed right laryngeal mass presenting with persistent shortness of breath, cough.   [facility]. 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 Biopsy and tracheostomy was recommended and [facility] However, pt eventually wanted to do this outpatient and left AMA.    He presents today with continued shortness of breath and now agreeable to further intervention. Says he left because he wanted to discuss with family. Shortness of breath is worse with exertion. He has hoarseness of voice that has been ongoing for at least a year. Has 20 year half a pack smoking hx.    In the ED, temperature was 98.74F, HR of 110, BP of 146/84 on room air.     WBC of 10.6, hemoglobin of 14.2   Sodium 131, potassium 3.8, creatinine of 0.60   Repeat CXR today was negative.   EDP did discuss with CCM who feels the patient is stable for stepdown unit tonight and can be consult following his tracheostomy.  Hospital Course:  Principal Problem:   Laryngeal mass Active Problems:   Shortness of breath   Neck mass   Assessment and Plan:   * Laryngeal mass/SCC  CT chest with contrast does not show any evidence of further metastatic disease in the chest. S/p laryngoscopy biopsy and trach placement on 11/18 by ENT,  Changed to cuffless on 11/23. He is cleared to go home by ENT, detail please see Dr HSabino Gasser's note from 11/24     Seen by speech: Diet recommendations: Regular;Thin liquid Liquids provided via: Cup;Straw Medication Administration: Whole meds with liquid Supervision: Patient able to self feed;Intermittent supervision to cue for compensatory strategies Compensations: Slow rate;Small sips/bites;Clear throat intermittently;Other (Comment) (wear PMV) Postural Changes and/or Swallow Maneuvers: Seated upright 90 degrees           Patient may use Passy-Muir Speech Valve: During all waking hours (remove during sleep);During PO intake/meals PMSV Supervision: Intermittent MD: Please consider changing trach tube to : Cuffless;Smaller size      COPD Tobacco Use -no wheezing, prn bronchodilators. Counseled to quit using. He states he will stop smoking    Essential HTN -not on meds PTA, started Losartan '50mg'$  po daily  in ICU, bp stable on losartan, continue at discharge . -patient is advised to check his blood pressure at home and bring in record for pcp to review   Hypokalemia - Repleted   Discharge Exam: BP 122/84 (BP Location: Left Arm)   Pulse 76   Temp 98.2 F (36.8 C) (Oral)   Resp 16   Ht '5\' 6"'$  (1.676 m)   Wt 67.6 kg   SpO2 97%   BMI 24.05 kg/m   General: NAD, + trach, ambulating  Cardiovascular: RRR Respiratory:  normal respiratory effort    Discharge Instructions     Ambulatory referral to Hematology / Oncology   Complete by: As directed    Newly diagnosed head neck cancer   Diet general   Complete by: As directed    Discharge wound care:   Complete by: As directed    Per ENT instruction   Increase activity slowly   Complete by: As directed       Allergies as of 06/19/2022   No Known Allergies      Medication List     STOP taking these medications    MUCINEX FAST-MAX CONGEST COUGH PO       TAKE these medications    ibuprofen 200 MG tablet Commonly known as: ADVIL Take 200-600 mg by mouth every 6 (six) hours as needed for fever or headache (pain).   losartan 50 MG tablet Commonly known as: COZAAR Take 1 tablet (50 mg total) by mouth daily. Start taking on: June 20, 2022   oxyCODONE-acetaminophen 5-325 MG tablet Commonly known as: Percocet Take 1 tablet by mouth every 8 (eight) hours as needed for up to 7 days for severe pain.               Discharge Care Instructions  (From admission, onward)           Start     Ordered   06/19/22 0000  Discharge wound care:       Comments: Per ENT instruction   06/19/22 1235           No Known Allergies  Follow-up [facility]. Schedule an appointment as soon as possible for a visit.   Specialty: Internal Medicine Contact information: [place] [id number] [place] [address] [place], MD Follow up in 2 week(s).   Specialty: Otolaryngology Contact information: [address] [place] [address] [phone]         f/u with pcp Follow up in 2 day(s).          Erick Colace, NP Follow up.   Specialties: Nurse Practitioner, Acute Care, Pulmonary Disease Why: f/u with tracheostomy clinic Contact information: [address] [place] [address] [place]  [address] [phone]                  The results of significant diagnostics from this hospitalization (including imaging, microbiology, ancillary and laboratory) are listed below for reference.    Significant Diagnostic Studies: DG Swallowing Func-Speech Pathology  Result Date: 06/15/2022 Table formatting from the original result was not included. Images from the original result were not included. Objective Swallowing Evaluation: Type of Study: MBS-Modified Barium Swallow Study  Patient Details Name: Daniel Marshall MRN: [id number] Date of Birth: 1976-03-01 Today's Date: 06/15/2022 Time: SLP Start Time (ACUTE ONLY): 1212 -SLP Stop Time (ACUTE ONLY): 1700 SLP Time  Calculation (min) (ACUTE ONLY): 13 min Past Medical History: Past Medical History: Diagnosis Date  Asthma   as a child  Broken ribs   Dyspnea   Hypertension  Past Surgical History: Past Surgical History: Procedure Laterality Date  DIRECT LARYNGOSCOPY N/A 06/13/2022  Procedure: DIRECT LARYNGOSCOPY WITH BIOPSY;  Surgeon: Jenetta Downer, MD;  Location: [place];  Service: ENT;  Laterality: N/A;  NO PAST SURGERIES    TRACHEOSTOMY TUBE PLACEMENT N/A 06/13/2022  Procedure: AWAKE TRACHEOSTOMY;  Surgeon: Jenetta Downer, MD;  Location: [place];  Service: ENT;  Laterality: N/A; HPI: Pt is a 46 yo male who presented to [facility]15 with several days of cough and dyspnea. 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 Pt returned to [facility] ED on 11/16 and 11/18 pt underwent elective trach and laryngeal mass biopsies. CT showed asymmetric thickening of the right aryepiglottic fold and asymmetric enlargement of the right laryngeal ventricle and piriform sinus. This has progressed in the interval. 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 PMH includes: asthma, broken ribs, dyspnea, HTN  Subjective: alert and cooperative  Recommendations for follow up therapy are one component of a multi-disciplinary  discharge planning process, led by the attending physician.  Recommendations may be updated based on patient status, additional functional criteria and insurance authorization. Assessment / Plan / Recommendation   06/15/2022   3:00 PM Clinical Impressions Clinical Impression Pt has a mild pharyngeal dysphagia with reduced base of tongue retraction and epiglottic inversion. There is mild vallecular residue, noted most with more solid foods. Laryngeal vestibule closure is relatively intact considering location of mass. Penetration occurred with thin liquids before the swallow, but it was trace. What did not clear spontaneously was cleared easily with a cued throat clear. Recommend continuing with regular diet and thin liquids with use of PMV  for any PO intake and use of intermittent throat clear. SLP Visit Diagnosis Dysphagia, unspecified (R13.10) Impact on safety and function Mild aspiration risk     06/15/2022   3:00 PM Treatment Recommendations Treatment Recommendations Therapy as outlined in treatment plan below     06/15/2022   3:00 PM Prognosis Prognosis for Safe Diet Advancement Good   06/15/2022   3:00 PM Diet Recommendations SLP Diet Recommendations Regular solids;Thin liquid Liquid Administration via Cup;Straw Medication Administration Whole meds with liquid Compensations Slow rate;Small sips/bites;Clear throat intermittently Postural Changes Seated upright at 90 degrees     06/15/2022   3:00 PM Other Recommendations Oral Care Recommendations Oral care BID Other Recommendations Place PMSV during PO intake Follow Up Recommendations Outpatient SLP Functional Status Assessment Patient has had a recent decline in their functional status and demonstrates the ability to make significant improvements in function in a reasonable and predictable amount of time.   06/15/2022   3:00 PM Frequency and Duration  Speech Therapy Frequency (ACUTE ONLY) min 2x/week Treatment Duration 2 weeks      No data to display        06/15/2022   3:00 PM Pharyngeal Phase Pharyngeal Phase Impaired Pharyngeal- Thin Teaspoon Reduced epiglottic inversion;Reduced anterior laryngeal mobility;Reduced tongue base retraction;Pharyngeal residue - valleculae Pharyngeal- Thin Cup Reduced epiglottic inversion;Reduced anterior laryngeal mobility;Reduced tongue base retraction;Pharyngeal residue - valleculae Pharyngeal- Thin Straw Reduced epiglottic inversion;Reduced anterior laryngeal mobility;Reduced tongue base retraction;Pharyngeal residue - valleculae;Penetration/Aspiration before swallow Pharyngeal Material enters airway, remains ABOVE vocal cords and not ejected out Pharyngeal- Puree Reduced anterior laryngeal mobility;Reduced tongue base retraction;Pharyngeal residue - valleculae Pharyngeal-  Regular Reduced anterior laryngeal mobility;Reduced tongue base retraction;Pharyngeal residue - valleculae Pharyngeal- Pill Reduced anterior laryngeal mobility;Reduced tongue base retraction;Pharyngeal residue - valleculae    06/15/2022   3:00 PM Cervical Esophageal Phase  Cervical Esophageal Phase [facility] Osie Bond., M.A. [facility] Office [phone] Secure chat preferred 06/15/2022, 3:17 PM                     CT CHEST W CONTRAST  Result Date: 06/14/2022 CLINICAL DATA:  Head neck cancer, for staging EXAM: CT CHEST WITH CONTRAST TECHNIQUE: Multidetector CT imaging of the chest was performed during intravenous contrast administration. RADIATION DOSE REDUCTION: This exam was performed according to the departmental dose-optimization program which includes automated exposure control, adjustment of the mA and/or kV according to patient size and/or use of iterative reconstruction technique. CONTRAST:  72m OMNIPAQUE IOHEXOL 350 MG/ML SOLN COMPARISON:  Chest radiograph dated 06/13/2022. CTA chest dated 01/06/2022. FINDINGS: Cardiovascular: Heart is normal in size.  No pericardial effusion. No evidence of thoracic aortic aneurysm.  Mediastinum/Nodes: No suspicious mediastinal lymphadenopathy. Retrosternal fluid/gas, likely reflecting postprocedural changes in the setting of recent tracheostomy. Visualized thyroid is unremarkable. Lungs/Pleura: Tracheostomy in satisfactory position. Mild centrilobular emphysematous changes, upper lung predominant. No focal consolidation. 3 mm nodule at the left lung apex (series 4/image 32), stable versus mildly improved. No new/suspicious pulmonary nodules. No pleural effusion or pneumothorax. Upper Abdomen: Visualized upper abdomen is grossly unremarkable. Musculoskeletal: Visualized osseous structures are within normal limits. IMPRESSION: Tracheostomy in satisfactory position. Associated postprocedural changes. No findings suspicious for metastatic disease in the chest. 3 mm nodule at the left lung apex, stable versus mildly improved, benign. No follow-up is recommended. Emphysema (ICD10-J43.9). Electronically Signed   By: SJulian HyM.D.   On: 06/14/2022 21:55   DG Chest Port 1 View  Result Date: 06/13/2022 CLINICAL DATA:  Status post tracheostomy EXAM: PORTABLE CHEST 1 VIEW COMPARISON:  06/11/2022 FINDINGS: Tracheostomy tube is noted with tip 5.5 cm above the carina. The cardiomediastinal silhouette is unremarkable. There is no evidence of focal airspace disease, pulmonary edema, suspicious pulmonary nodule/mass, pleural effusion, or pneumothorax. No acute bony abnormalities are identified. Remote rib fractures again identified. IMPRESSION: Tracheostomy tube with tip 5.5 cm above the carina. No significant abnormalities. Electronically Signed   By: JMargarette CanadaM.D.   On: 06/13/2022 12:03   DG Chest 2 View  Result Date: 06/11/2022 CLINICAL DATA:  Shortness of breath EXAM: CHEST - 2 VIEW COMPARISON:  06/10/2022 FINDINGS: The heart size and mediastinal contours are within normal limits. Both lungs are clear. The visualized skeletal structures are unremarkable. IMPRESSION: No active  cardiopulmonary disease. Electronically Signed   By: NDavina PokeD.O.   On: 06/11/2022 13:50   DG Wrist 2 Views Left  Result Date: 06/10/2022 CLINICAL DATA:  Fall pain EXAM: LEFT WRIST - 2 VIEW COMPARISON:  None Available. FINDINGS: There is no evidence of fracture or dislocation. There is no evidence of arthropathy or other focal bone abnormality. Soft tissues are unremarkable. IMPRESSION: Negative. Electronically Signed   By: KDonavan FoilM.D.   On: 06/10/2022 16:27   CT SOFT TISSUE NECK W CONTRAST  Result Date: 06/10/2022 CLINICAL DATA:  Epiglottitis or tonsillitis. Follow-up previous abnormality right vocal cord. EXAM: CT NECK WITH CONTRAST TECHNIQUE: Multidetector CT imaging of the neck was performed using the standard protocol following the bolus administration of intravenous contrast. RADIATION DOSE REDUCTION: This exam was performed according to the departmental dose-optimization program which includes automated exposure control, adjustment  of the mA and/or kV according to patient size and/or use of iterative reconstruction technique. CONTRAST:  83m OMNIPAQUE IOHEXOL 300 MG/ML  SOLN COMPARISON:  CT neck 01/06/2022 FINDINGS: Pharynx and larynx: Image quality degraded by extensive motion. 2 attempts were made to scan the neck both degraded by motion. Tonsils are symmetric.  No mass or abscess.  Epiglottis is normal. There is asymmetric thickening of the right aryepiglottic fold which has progressed in the interval. There is asymmetric enlargement of the right laryngeal ventricle and piriform sinus, with progression from the prior study. No airway compromise Salivary glands: No inflammation, mass, or stone. Thyroid: Negative Lymph nodes: No enlarged lymph nodes in the neck. Vascular: Normal vascular enhancement. Mild atherosclerotic disease in the carotid artery bilaterally. Limited intracranial: Not imaged Visualized orbits: Negative Mastoids and visualized paranasal sinuses: Mild mucosal  edema right and left maxillary sinus. Remaining sinuses clear. Mastoid clear. Skeleton: No acute abnormality Upper chest: Lung apices clear bilaterally Other: None IMPRESSION: 1. Image quality degraded by extensive motion. 2. Asymmetric thickening of the right aryepiglottic fold and asymmetric enlargement of the right laryngeal ventricle and piriform sinus. This has progressed in the interval. 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 Recommend laryngoscopy. 3. No tonsillar abscess. No adenopathy in the neck. 4. Mild atherosclerotic disease in the carotid artery bilaterally. Electronically Signed   By: CFranchot GalloM.D.   On: 06/10/2022 13:38   DG Chest Port 1 View  Result Date: 06/10/2022 CLINICAL DATA:  Dyspnea EXAM: PORTABLE CHEST 1 VIEW COMPARISON:  01/06/2022 FINDINGS: Left basilar opacity likely represents a confluence of vascular and osseous shadows. The lungs are otherwise clear. No pneumothorax or pleural effusion. Cardiac size within normal limits. Pulmonary vascularity is normal. No acute bone abnormality. Healed bilateral rib fractures noted. IMPRESSION: 1. No active disease. Electronically Signed   By: AFidela SalisburyM.D.   On: 06/10/2022 01:45    Microbiology: Recent Results (from the past 240 hour(s))  Resp Panel by RT-PCR (Flu A&B, Covid) Anterior Nasal Swab     Status: None   Collection Time: 06/10/22  1:36 AM   Specimen: Anterior Nasal Swab  Result Value Ref Range Status   SARS Coronavirus 2 by RT PCR NEGATIVE NEGATIVE Final    Comment: (NOTE) SARS-CoV-2 target nucleic acids are NOT DETECTED.  The SARS-CoV-2 RNA is generally detectable in upper respiratory specimens during the acute phase of infection. The lowest concentration of SARS-CoV-2 viral copies this assay can detect is 138 copies/mL. A negative result does not preclude SARS-Cov-2 infection and should not be used as the sole basis for treatment or other patient management decisions. A  negative result may occur with  improper specimen collection/handling, submission of specimen other than nasopharyngeal swab, presence of viral mutation(s) within the areas targeted by this assay, and inadequate number of viral copies(<138 copies/mL). A negative result must be combined with clinical observations, patient history, and epidemiological information. The expected result is Negative.  Fact Sheet for Patients:  hEntrepreneurPulse.com.au Fact Sheet for Healthcare Providers:  hIncredibleEmployment.be This test is no t yet approved or cleared by the [place] and  has been authorized for detection and/or diagnosis of SARS-CoV-2 by FDA under an Emergency Use Authorization (EUA). This EUA will remain  in effect (meaning this test can be used) for the duration of the COVID-19 declaration under Section 564(b)(1) of the Act, 21 U.S.C.section 360bbb-3(b)(1), unless the authorization is terminated  or revoked sooner.  Influenza A by PCR NEGATIVE NEGATIVE Final   Influenza B by PCR NEGATIVE NEGATIVE Final    Comment: (NOTE) The Xpert Xpress SARS-CoV-2/FLU/RSV plus assay is intended as an aid in the diagnosis of influenza from Nasopharyngeal swab specimens and should not be used as a sole basis for treatment. Nasal washings and aspirates are unacceptable for Xpert Xpress SARS-CoV-2/FLU/RSV testing.  Fact Sheet for Patients: EntrepreneurPulse.com.au  Fact Sheet for Healthcare Providers: IncredibleEmployment.be  This test is not yet approved or cleared by the [place] FDA and has been authorized for detection and/or diagnosis of SARS-CoV-2 by FDA under an Emergency Use Authorization (EUA). This EUA will remain in effect (meaning this test can be used) for the duration of the COVID-19 declaration under Section 564(b)(1) of the Act, 21 U.S.C. section 360bbb-3(b)(1), unless the authorization  is terminated or revoked.  Performed at [facility], [place] [address] [place], [place] [address]   MRSA Next Gen by PCR, Nasal     Status: None   Collection Time: 06/10/22  5:08 AM   Specimen: Nasal Mucosa; Nasal Swab  Result Value Ref Range Status   MRSA by PCR Next Gen NOT DETECTED NOT DETECTED Final    Comment: (NOTE) The GeneXpert MRSA Assay (FDA approved for NASAL specimens only), is one component of a comprehensive MRSA colonization surveillance program. It is not intended to diagnose MRSA infection nor to guide or monitor treatment for MRSA infections. Test performance is not FDA approved in patients less than 82 years old. Performed at [facility], [place] [address], [place], [place] [address]   Respiratory (~20 pathogens) panel by PCR     Status: None   Collection Time: 06/10/22 11:59 AM   Specimen: Nasopharyngeal Swab; Respiratory  Result Value Ref Range Status   Adenovirus NOT DETECTED NOT DETECTED Final   Coronavirus 229E NOT DETECTED NOT DETECTED Final    Comment: (NOTE) The Coronavirus on the Respiratory Panel, DOES NOT test for the novel  Coronavirus (2019 nCoV)    Coronavirus HKU1 NOT DETECTED NOT DETECTED Final   Coronavirus NL63 NOT DETECTED NOT DETECTED Final   Coronavirus OC43 NOT DETECTED NOT DETECTED Final   Metapneumovirus NOT DETECTED NOT DETECTED Final   Rhinovirus / Enterovirus NOT DETECTED NOT DETECTED Final   Influenza A NOT DETECTED NOT DETECTED Final   Influenza B NOT DETECTED NOT DETECTED Final   Parainfluenza Virus 1 NOT DETECTED NOT DETECTED Final   Parainfluenza Virus 2 NOT DETECTED NOT DETECTED Final   Parainfluenza Virus 3 NOT DETECTED NOT DETECTED Final   Parainfluenza Virus 4 NOT DETECTED NOT DETECTED Final   Respiratory Syncytial Virus NOT DETECTED NOT DETECTED Final   Bordetella pertussis NOT DETECTED NOT DETECTED Final   Bordetella Parapertussis NOT DETECTED NOT DETECTED Final   Chlamydophila  pneumoniae NOT DETECTED NOT DETECTED Final   Mycoplasma pneumoniae NOT DETECTED NOT DETECTED Final    Comment: Performed at [facility], [place]. [address], [place], [place] [address]     Labs: Basic Metabolic Panel: Recent Labs  Lab 06/16/22 0612 06/17/22 0627 06/18/22 0558 06/19/22 0641  NA 134* 134* 135 132*  K 3.1* 3.7 4.3 4.2  CL 96* 98 101 95*  CO2 '28 23 27 25  '$ GLUCOSE 157* 142* 93 99  BUN '8 8 8 6  '$ CREATININE 0.67 0.66 0.79 0.85  CALCIUM 8.5* 8.7* 9.1 9.1  MG 1.9 1.9 2.0 1.9   Liver Function Tests: No results for input(s): "AST", "ALT", "ALKPHOS", "BILITOT", "  PROT", "ALBUMIN" in the last 168 hours. No results for input(s): "LIPASE", "AMYLASE" in the last 168 hours. No results for input(s): "AMMONIA" in the last 168 hours. CBC: Recent Labs  Lab 06/16/22 0612 06/17/22 0627 06/18/22 0558 06/19/22 0641  WBC 9.8 8.4 8.8 9.3  HGB 11.9* 11.8* 13.0 13.4  HCT 34.1* 33.7* 36.9* 38.1*  MCV 85.7 87.1 86.6 85.6  PLT 197 219 264 317   Cardiac Enzymes: No results for input(s): "CKTOTAL", "CKMB", "CKMBINDEX", "TROPONINI" in the last 168 hours. BNP: BNP (last 3 results) Recent Labs    01/06/22 0801  BNP 19.8    ProBNP (last 3 results) No results for input(s): "PROBNP" in the last 8760 hours.  CBG: Recent Labs  Lab 06/13/22 2242 06/14/22 0350  GLUCAP 98 91    FURTHER DISCHARGE INSTRUCTIONS:   Get Medicines reviewed and adjusted: Please take all your medications with you for your next visit with your Primary MD   Laboratory/radiological data: Please request your Primary MD to go over all hospital tests and procedure/radiological results at the follow up, please ask your Primary MD to get all Hospital records sent to his/her office.   In some cases, they will be blood work, cultures and biopsy results pending at the time of your discharge. Please request that your primary care M.D. goes through all the records of your hospital data and follows up on these  results.   Also Note the following: If you experience worsening of your admission symptoms, develop shortness of breath, life threatening emergency, suicidal or homicidal thoughts you must seek medical attention immediately by calling 911 or calling your MD immediately  if symptoms less severe.   You must read complete instructions/literature along with all the possible adverse reactions/side effects for all the Medicines you take and that have been prescribed to you. Take any new Medicines after you have completely understood and accpet all the possible adverse reactions/side effects.    Do not drive when taking Pain medications or sleeping medications (Benzodaizepines)   Do not take more than prescribed Pain, Sleep and Anxiety Medications. It is not advisable to combine anxiety,sleep and pain medications without talking with your primary care practitioner   Special Instructions: If you have smoked or chewed Tobacco  in the last 2 yrs please stop smoking, stop any regular Alcohol  and or any Recreational drug use.   Wear Seat belts while driving.   Please note: You were cared for by a hospitalist during your hospital stay. Once you are discharged, your primary care physician will handle any further medical issues. Please note that NO REFILLS for any discharge medications will be authorized once you are discharged, as it is imperative that you return to your primary care physician (or establish a relationship with a primary care physician if you do not have one) for your post hospital discharge needs so that they can reassess your need for medications and monitor your lab values.     Signed:  Florencia Reasons MD, PhD, FACP  [facility] 06/19/2022, 1:10 PM

## 2022-06-19 NOTE — Progress Notes (Signed)
Daniel Marshall to be [name]'d  per MD order.  Discussed with the patient and all questions fully answered.  VSS, Skin clean, dry and intact without evidence of skin break down, no evidence of skin tears noted.  IV catheter discontinued intact. Site without signs and symptoms of complications. Dressing and pressure applied.  An After Visit Summary was printed and given to the patient.  [name] education completed with patient/family including follow up instructions, medication list, d/c activities limitations if indicated, with other d/c instructions as indicated by MD - patient able to verbalize understanding, all questions fully answered.   Pt trach supplies was given to patient.  Patient instructed to return to ED, call 911, or call MD for any changes in condition.   Patient to be escorted via [place], and [name] home via private auto.

## 2022-06-19 NOTE — TOC Progression Note (Signed)
Transition of Care [facility]) - Progression Note    Patient Details  Name: Daniel Marshall MRN: [id number] Date of Birth: 25-May-1976  Transition of Care [facility]) CM/SW Contact  Sharin Mons, RN Phone Number: 06/19/2022, 10:15 AM  Clinical Narrative:    Pt for d/c today. [employer] to deliver DME: suction to bedside prior to d/c. Questions , please call Erasmo Downer @ [phone]. Per Erasmo Downer, [employer] is to ensure pt has trach supplies, one trach one size smaller and AMBU bag delivered to home today.  TOC team will continue to monitor and assist with needs...  Expected Discharge Plan: [place] Barriers to Discharge: No Barriers Identified  Expected Discharge Plan and Services Expected Discharge Plan: [place] In-house Referral: Financial Counselor                         DME Agency: [employer] Date DME Agency Contacted: 06/15/22 Time DME Agency Contacted: [id number] Representative spoke with at DME Agency: Erasmo Downer             Social Determinants of Health ([place]) Interventions    Readmission Risk Interventions     No data to display

## 2022-06-19 NOTE — Progress Notes (Signed)
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 Constance Holster Patient requesting assistance with medical disability paperwork and wants to know if he can go home today.  No pain or wound concerns.   O: 6-0 cuffless tracheostomy tube secured. PMV in place. Speaking comfortably.   IMAGING: CT Chest with contrast 06/14/22  IMPRESSION: Tracheostomy in satisfactory position. Associated postprocedural changes.   No findings suspicious for metastatic disease in the chest.   3 mm nodule at the left lung apex, stable versus mildly improved, benign. No follow-up is recommended.   Emphysema (ICD10-J43.9).     Electronically Signed   By: Julian Hy M.D.   On: 06/14/2022 21:55   CT neck with contrast 06/10/22  IMPRESSION: 1. Image quality degraded by extensive motion. 2. Asymmetric thickening of the right aryepiglottic fold and asymmetric enlargement of the right laryngeal ventricle and piriform sinus. This has progressed in the interval. 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 Recommend laryngoscopy. 3. No tonsillar abscess. No adenopathy in the neck. 4. Mild atherosclerotic disease in the carotid artery bilaterally.     Electronically Signed   By: Franchot Gallo M.D.   On: 06/10/2022 13:38  Surgical path 06/13/22:  Y SURGICAL PATHOLOGY CASE: [id number] PATIENT: Daniel Marshall Surgical Pathology Report     Clinical History: laryngeal mass (cm)     FINAL MICROSCOPIC DIAGNOSIS:  A.   LARYNGEAL MASS, RIGHT, BIOPSY: -    Invasive squamous cell carcinoma, keratinizing.    A/P: 46 y/o M with cT3N0M0 SCC of the larynx POD#6 s/p awake tracheostomy and DL. Doing well s/p change to cuffless tracheostomy.  - Please have discharge planner meet with patient today to start process for disability paperwork. I  recommend at least 2-3 months medical disability due to tracheostomy dependence and impending treatment of larynx cancer with either laryngectomy of chemoradiation therapy - Tracheostomy outpatient clinic referral  - Will arrange f/u with myself in 1-2 weeks at [facility] - Given diagnosis of SCC added patient to [facility] Multi-D tumor board. Referrals sent to [facility] for Laryngectomy, as well as referrals to local Rad-Onc / Med-onc to discuss CRT as an alternative therapy  Dispo: OK to discharge from ENT standpoint pending clearance from discharge planner regarding tracheostomy supplies / suction machine.   Electronically signed by:  Jenetta Downer, MD  Staff Physician Facial Plastic & Reconstructive Surgery Otolaryngology - Head and Neck Surgery [place], [place]

## 2022-06-22 ENCOUNTER — Other Ambulatory Visit: Payer: Self-pay

## 2022-06-22 DIAGNOSIS — C329 Malignant neoplasm of larynx, unspecified: Secondary | ICD-10-CM

## 2022-06-22 NOTE — Progress Notes (Incomplete)
Head and Neck Cancer Location of Tumor / Histology:  C32.9 (ICD-10-CM) - Squamous cell carcinoma of larynx ([place])   Patient presented with symptoms of:  History of present illness: ( per admitting provider Dr Flossie Buffy) HPI: Daniel Marshall is a 46 y.o. male with medical history significant of HTN,COPD, newly diagnosed right laryngeal mass presenting with persistent shortness of breath, cough.   Pt was admitted to [place] yesterday for respiratory distress requiring Bipap. 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 Biopsy and tracheostomy was recommended and he originally had plans to transfer from [facility] to ICU service here at [facility]. However, pt eventually wanted to do this outpatient and left AMA.    He presents today with continued shortness of breath and now agreeable to further intervention. Says he left because he wanted to discuss with family. Shortness of breath is worse with exertion. He has hoarseness of voice that has been ongoing for at least a year. Has 20 year half a pack smoking hx.    In the ED, temperature was 98.30F, HR of 110, BP of 146/84 on room air.    WBC of 10.6, hemoglobin of 14.2   Sodium 131, potassium 3.8, creatinine of 0.60   Repeat CXR today was negative.   EDP did discuss with CCM who feels the patient is stable for stepdown unit tonight and can be consult following his tracheostomy.  Biopsies revealed:  06-13-22 FINAL MICROSCOPIC DIAGNOSIS:   A.   LARYNGEAL MASS, RIGHT, BIOPSY:  -    Invasive squamous cell carcinoma, keratinizing.  COMMENT:  Dr. Saralyn Pilar has reviewed the slides (intradepartmental QA review for  malignancy).  GROSS DESCRIPTION:  Received fresh are 3 soft tan-red tissue fragments measuring 0.3 to 0.4  cm in greatest dimension.  The specimen is submitted in toto.  (GRP  06/15/2022)    Nutrition Status Yes  No Comments  Weight changes? '[]'$  '[]'$    Swallowing concerns? '[]'$  '[]'$    PEG? '[]'$  '[]'$     Referrals Yes No Comments  Social Work? '[]'$  '[]'$    Dentistry? '[]'$  '[]'$    Swallowing therapy? '[]'$  '[]'$    Nutrition? '[]'$  '[]'$    Med/Onc? '[]'$  '[]'$     Safety Issues Yes No Comments  Prior radiation? '[]'$  '[]'$    Pacemaker/ICD? '[]'$  '[]'$    Possible current pregnancy? '[]'$  '[]'$    Is the patient on methotrexate? '[]'$  '[]'$     Tobacco/Marijuana/Snuff/ETOH use: yes, smoker  Past/Anticipated interventions by otolaryngology, if any: Dr. Sabino Gasser 06-13-22 Pre-Op Diagnosis: LARYNGEAL MASS  Post-Op Diagnosis: LARYNGEAL MASS  Procedure: Procedure(s): DIRECT LARYNGOSCOPY WITH BIOPSY AWAKE TRACHEOSTOMY  Anesthesia: Monitor Anesthesia Care  Surgeon(s): Pamala Hurry, MD  Past/Anticipated interventions by medical oncology, if any:  Pt to have PET scan on 12-8 and see Dr. Chryl Heck on 07-06-22     Current Complaints / other details:  ***

## 2022-06-24 NOTE — Progress Notes (Signed)
Oncology Nurse Navigator Documentation   Placed introductory call to new referral patient ..... Introduced myself as the H&N oncology nurse navigator that works with Dr. Isidore Moos and Dr. Chryl Heck to whom he has been referred by Dr. Sabino Gasser. He confirmed understanding of referral. Briefly explained my role as his navigator, provided my contact information.  Confirmed understanding of upcoming appts and [place] location, explained arrival and registration process. I explained the purpose of a dental evaluation prior to starting RT, indicated he would be contacted by [facility] DM to arrange an appt.   I encouraged him to call with questions/concerns as he moves forward with appts and procedures.   He verbalized understanding of information provided, expressed appreciation for my call.   Navigator Initial Assessment Employment Status: unknown Currently on FMLA / STD: na Living Situation: he lives with his significant other Support System: family, friends PCP: none PCD: none Financial Concerns: yes Transportation Needs: no Sensory Deficits: no Engineer, building services Needed:  no Ambulation Needs: no Psychosocial Needs:  no Concerns/Needs Understanding Cancer:  addressed/answered by navigator to best of ability Self-Expressed Needs: no   Clinical biochemist, BSN, OCN Head & Neck Oncology Nurse [place] at [facility] Phone # [phone]  Fax # [phone]

## 2022-06-26 ENCOUNTER — Ambulatory Visit: Payer: Medicaid Other

## 2022-06-26 ENCOUNTER — Ambulatory Visit
Admission: RE | Admit: 2022-06-26 | Discharge: 2022-06-26 | Disposition: A | Discharge status: Home / Self Care | Payer: Medicaid Other | Source: Ambulatory Visit | Attending: Radiation Oncology | Admitting: Radiation Oncology

## 2022-06-26 DIAGNOSIS — Z930 Tracheostomy status: Secondary | ICD-10-CM | POA: Diagnosis not present

## 2022-06-26 DIAGNOSIS — Z720 Tobacco use: Secondary | ICD-10-CM | POA: Diagnosis not present

## 2022-06-26 DIAGNOSIS — C329 Malignant neoplasm of larynx, unspecified: Secondary | ICD-10-CM | POA: Diagnosis not present

## 2022-06-30 DIAGNOSIS — C329 Malignant neoplasm of larynx, unspecified: Secondary | ICD-10-CM | POA: Diagnosis not present

## 2022-06-30 DIAGNOSIS — Z930 Tracheostomy status: Secondary | ICD-10-CM | POA: Diagnosis not present

## 2022-06-30 DIAGNOSIS — F17210 Nicotine dependence, cigarettes, uncomplicated: Secondary | ICD-10-CM | POA: Diagnosis not present

## 2022-06-30 DIAGNOSIS — C321 Malignant neoplasm of supraglottis: Secondary | ICD-10-CM | POA: Diagnosis not present

## 2022-07-01 ENCOUNTER — Inpatient Hospital Stay: Payer: Self-pay | Admitting: Nurse Practitioner

## 2022-07-01 NOTE — Progress Notes (Incomplete)
Head and Neck Cancer Location of Tumor / Histology:  C32.9 (ICD-10-CM) - Squamous cell carcinoma of larynx ([place])    Patient presented with symptoms of:  History of present illness: ( per admitting provider Dr Flossie Buffy) HPI: Daniel Marshall is a 46 y.o. male with medical history significant of HTN,COPD, newly diagnosed right laryngeal mass presenting with persistent shortness of breath, cough.   Pt was admitted to [place] yesterday for respiratory distress requiring Bipap. 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 Biopsy and tracheostomy was recommended and he originally had plans to transfer from [facility] to ICU service here at [facility]. However, pt eventually wanted to do this outpatient and left AMA.    He presents today with continued shortness of breath and now agreeable to further intervention. Says he left because he wanted to discuss with family. Shortness of breath is worse with exertion. He has hoarseness of voice that has been ongoing for at least a year. Has 20 year half a pack smoking hx.    In the ED, temperature was 98.85F, HR of 110, BP of 146/84 on room air.    WBC of 10.6, hemoglobin of 14.2   Sodium 131, potassium 3.8, creatinine of 0.60   Repeat CXR today was negative.   EDP did discuss with CCM who feels the patient is stable for stepdown unit tonight and can be consult following his tracheostomy.   Biopsies revealed:  06-13-22 FINAL MICROSCOPIC DIAGNOSIS:   A.   LARYNGEAL MASS, RIGHT, BIOPSY:  -    Invasive squamous cell carcinoma, keratinizing.  COMMENT:  Dr. Saralyn Pilar has reviewed the slides (intradepartmental QA review for  malignancy).  GROSS DESCRIPTION:  Received fresh are 3 soft tan-red tissue fragments measuring 0.3 to 0.4  cm in greatest dimension.  The specimen is submitted in toto.  (GRP  06/15/2022)      Nutrition Status  Yes No Comments  Weight changes? '[]'$   '[]'$      Swallowing concerns? '[]'$   '[]'$      PEG? '[]'$   '[]'$        Referrals Yes No Comments  Social Work? '[]'$   '[]'$      Dentistry? '[]'$   '[]'$      Swallowing therapy? '[]'$   '[]'$      Nutrition? '[]'$   '[]'$      Med/Onc? '[]'$   '[]'$        Safety Issues Yes No Comments  Prior radiation? '[]'$   '[]'$      Pacemaker/ICD? '[]'$   '[]'$      Possible current pregnancy? '[]'$   '[]'$      Is the patient on methotrexate? '[]'$   '[]'$        Tobacco/Marijuana/Snuff/ETOH use: yes, smoker   Past/Anticipated interventions by otolaryngology, if any: Dr. Sabino Gasser 06-13-22 Pre-Op Diagnosis: LARYNGEAL MASS  Post-Op Diagnosis: LARYNGEAL MASS  Procedure: Procedure(s): DIRECT LARYNGOSCOPY WITH BIOPSY AWAKE TRACHEOSTOMY  Anesthesia: Monitor Anesthesia Care  Surgeon(s): Pamala Hurry, MD   Past/Anticipated interventions by medical oncology, if any:  Pt to have PET scan on 12-8 and see Dr. Chryl Heck on 07-06-22         Current Complaints / other details:  ***

## 2022-07-03 ENCOUNTER — Encounter (HOSPITAL_COMMUNITY)
Admission: RE | Admit: 2022-07-03 | Discharge: 2022-07-03 | Disposition: A | Discharge status: Home / Self Care | Payer: Medicaid Other | Source: Ambulatory Visit | Attending: Radiation Oncology | Admitting: Radiation Oncology

## 2022-07-03 DIAGNOSIS — C329 Malignant neoplasm of larynx, unspecified: Secondary | ICD-10-CM | POA: Insufficient documentation

## 2022-07-03 LAB — GLUCOSE, CAPILLARY: Glucose-Capillary: 92 mg/dL (ref 70–99)

## 2022-07-03 MED ORDER — FLUDEOXYGLUCOSE F - 18 (FDG) INJECTION
7.4000 | Freq: Once | INTRAVENOUS | Status: AC
  Administered 2022-07-03: 7.4 via INTRAVENOUS

## 2022-07-06 ENCOUNTER — Inpatient Hospital Stay: Payer: Medicaid Other | Attending: Hematology and Oncology | Admitting: Hematology and Oncology

## 2022-07-06 ENCOUNTER — Other Ambulatory Visit: Payer: Self-pay

## 2022-07-06 ENCOUNTER — Inpatient Hospital Stay: Payer: Medicaid Other

## 2022-07-06 ENCOUNTER — Encounter: Payer: Self-pay | Admitting: Hematology and Oncology

## 2022-07-06 VITALS — BP 147/83 | HR 117 | Temp 98.1°F | Resp 18 | Ht 66.0 in | Wt 153.7 lb

## 2022-07-06 DIAGNOSIS — Z79899 Other long term (current) drug therapy: Secondary | ICD-10-CM | POA: Insufficient documentation

## 2022-07-06 DIAGNOSIS — F1010 Alcohol abuse, uncomplicated: Secondary | ICD-10-CM | POA: Diagnosis not present

## 2022-07-06 DIAGNOSIS — J432 Centrilobular emphysema: Secondary | ICD-10-CM | POA: Insufficient documentation

## 2022-07-06 DIAGNOSIS — I700 Atherosclerosis of aorta: Secondary | ICD-10-CM | POA: Insufficient documentation

## 2022-07-06 DIAGNOSIS — F17210 Nicotine dependence, cigarettes, uncomplicated: Secondary | ICD-10-CM | POA: Insufficient documentation

## 2022-07-06 DIAGNOSIS — I10 Essential (primary) hypertension: Secondary | ICD-10-CM | POA: Diagnosis not present

## 2022-07-06 DIAGNOSIS — C329 Malignant neoplasm of larynx, unspecified: Secondary | ICD-10-CM | POA: Diagnosis not present

## 2022-07-06 DIAGNOSIS — R410 Disorientation, unspecified: Secondary | ICD-10-CM | POA: Insufficient documentation

## 2022-07-06 NOTE — Assessment & Plan Note (Signed)
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  We have discussed his PET/CT imaging which did not show any evidence of metastatic disease or pathologic lymphadenopathy.  I agree that he should be considered for surgical resection and should follow-up with the ENT team at [facility] for recommendations.  If he is not a surgical candidate, we can certainly consider concurrent chemo RT as choice of treatment.  I briefly discussed with him about chemotherapy, schedule, toxicity which includes but not limited to fatigue, nausea, vomiting, increased risk of infections, neuropathy, ototoxicity and nephrotoxicity.  He is willing to consider all options.  He will follow-up with Dr. Nicolette Bang and return to clinic with for additional recommendations in about a week or 2.

## 2022-07-06 NOTE — Progress Notes (Signed)
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  We have discussed his PET/CT imaging which did not show any evidence of metastatic disease or pathologic lymphadenopathy.  I agree that he should be considered for surgical resection and should follow-up with the ENT team at [facility] for recommendations.  If he is not a surgical candidate, we can certainly consider concurrent chemo RT as choice of treatment.  I briefly discussed with him about chemotherapy, schedule, toxicity which includes but not limited to fatigue, nausea, vomiting, increased risk of infections, neuropathy, ototoxicity and nephrotoxicity.  He is willing to consider all options.  He will follow-up with Dr. Nicolette Bang and return to clinic with for additional recommendations in about a week or 2.  No orders of the defined types were placed in this encounter.    HISTORY OF PRESENTING ILLNESS:  Daniel Marshall 46 y.o. male is here because of SCC larynx.  This is a 46 year old male patient with past medical history significant for smoking and alcohol intake who was seen by Dr. Rutherford Limerick in ENT with chief complaint of hoarseness of voice.  He was found to have right supraglottic/glottic squamous cell carcinoma admitted for tracheostomy and had biopsy at the same time.  He had PET/CT 3 days ago which did not show any evidence of lymphadenopathy or metastatic disease.  He has been following up with the ENT team of [facility], Dr. Nicolette Bang for surgical recommendations.  He has a follow-up appointment  with them tomorrow.  He does not see a doctor on a regular basis.  He has newly diagnosed hypertension and has been taking antihypertensive otherwise denies any complaints.  Today he reports a lot of confusion all recommendations from both [facility] as well as colon.  Tells me that he does not quite understand why he has to repeat another scan to [facility] if he just had a PET/CT.  He also appears overwhelmed by all his appointments which is understandably.  His last cigarette was on Saturday. He drinks beer on a regular basis.  Staging showed hypermetabolic asymmetric soft tissue thickening along the right vocal cord compatible with prior history of laryngeal cancer.  He is here by himself to the appointment.  He denies any new health complaints except for ongoing hoarseness.  No shortness of breath.  Rest of the pertinent 10 point reviewed and negative.  REVIEW OF SYSTEMS:   Constitutional: Denies fevers, chills or abnormal night sweats Eyes: Denies blurriness of vision, double vision or watery eyes Ears, nose, mouth, throat, and face: Denies mucositis or sore throat Respiratory: Denies cough, dyspnea or wheezes Cardiovascular: Denies palpitation, chest discomfort or lower extremity swelling Gastrointestinal:  Denies nausea, heartburn or change in bowel habits Skin: Denies abnormal skin rashes Lymphatics: Denies new lymphadenopathy or easy bruising Neurological:Denies numbness, tingling or new weaknesses Behavioral/Psych: Mood is stable, no new changes  All other systems were reviewed with the patient and are negative.  MEDICAL HISTORY:  Past Medical History:  Diagnosis Date   Asthma    as a child   Broken ribs  Dyspnea    Hypertension     SURGICAL HISTORY: Past Surgical History:  Procedure Laterality Date   DIRECT LARYNGOSCOPY N/A 06/13/2022   Procedure: DIRECT LARYNGOSCOPY WITH BIOPSY;  Surgeon: Jenetta Downer, MD;  Location: [facility] OR;  Service: ENT;  Laterality:  N/A;   NO PAST SURGERIES     TRACHEOSTOMY TUBE PLACEMENT N/A 06/13/2022   Procedure: AWAKE TRACHEOSTOMY;  Surgeon: Jenetta Downer, MD;  Location: [place];  Service: ENT;  Laterality: N/A;    SOCIAL HISTORY: Social History   Socioeconomic History   Marital status: Single    Spouse name: Not on file   Number of children: Not on file   Years of education: Not on file   Highest education level: Not on file  Occupational History   Not on file  Tobacco Use   Smoking status: Every Day    Types: Cigarettes   Smokeless tobacco: Never  Substance and Sexual Activity   Alcohol use: Yes    Comment: socially   Drug use: Yes    Types: Marijuana   Sexual activity: Not on file  Other Topics Concern   Not on file  Social History Narrative   Not on file   Social Determinants of Health   Financial Resource Strain: Not on file  Food Insecurity: No Food Insecurity (06/14/2022)   Hunger Vital Sign    Worried About Running Out of Food in the Last Year: Never true    Ran Out of Food in the Last Year: Never true  Transportation Needs: No Transportation Needs (06/14/2022)   PRAPARE - Hydrologist (Medical): No    Lack of Transportation (Non-Medical): No  Physical Activity: Not on file  Stress: Not on file  Social Connections: Not on file  Intimate Partner Violence: Not At Risk (06/14/2022)   Humiliation, Afraid, Rape, and Kick questionnaire    Fear of Current or Ex-Partner: No    Emotionally Abused: No    Physically Abused: No    Sexually Abused: No    FAMILY HISTORY: Family History  Family history unknown: Yes    ALLERGIES:  has No Known Allergies.  MEDICATIONS:  Current Outpatient Medications  Medication Sig Dispense Refill   ibuprofen (ADVIL) 200 MG tablet Take 200-600 mg by mouth every 6 (six) hours as needed for fever or headache (pain).     losartan (COZAAR) 50 MG tablet Take 1 tablet (50 mg total) by mouth daily. 30 tablet 0   No current  facility-administered medications for this visit.     PHYSICAL EXAMINATION: ECOG PERFORMANCE STATUS: 1 - Symptomatic but completely ambulatory  Vitals:   07/06/22 1056  BP: (!) 147/83  Pulse: (!) 117  Resp: 18  Temp: 98.1 F (36.7 C)  SpO2: 99%   Filed Weights   07/06/22 1056  Weight: 153 lb 11.2 oz (69.7 kg)    Physical Exam Constitutional:      Appearance: Normal appearance.  Neck:     Comments: Trach in place and capped  Cardiovascular:     Rate and Rhythm: Normal rate and regular rhythm.  Pulmonary:     Effort: Pulmonary effort is normal.     Breath sounds: Normal breath sounds.  Musculoskeletal:        General: No swelling.     Cervical back: Normal range of motion and neck supple. No rigidity.  Lymphadenopathy:     Cervical: No cervical adenopathy.  Neurological:     Mental Status: He is alert.  LABORATORY DATA:  I have reviewed the data as listed Lab Results  Component Value Date   WBC 9.3 06/19/2022   HGB 13.4 06/19/2022   HCT 38.1 (L) 06/19/2022   MCV 85.6 06/19/2022   PLT 317 06/19/2022     Chemistry      Component Value Date/Time   NA 132 (L) 06/19/2022 0641   NA 135 11/25/2017 1621   K 4.2 06/19/2022 0641   CL 95 (L) 06/19/2022 0641   CO2 25 06/19/2022 0641   BUN 6 06/19/2022 0641   BUN 15 11/25/2017 1621   CREATININE 0.85 06/19/2022 0641   CREATININE 0.93 05/05/2017 1445      Component Value Date/Time   CALCIUM 9.1 06/19/2022 0641   ALKPHOS 74 06/11/2022 1348   AST 67 (H) 06/11/2022 1348   ALT 31 06/11/2022 1348   BILITOT 0.6 06/11/2022 1348       RADIOGRAPHIC STUDIES: I have personally reviewed the radiological images as listed and agreed with the findings in the report. NM PET Image Initial (PI) Skull Base To Thigh  Result Date: 07/06/2022 CLINICAL DATA:  Initial treatment strategy for squamous cell carcinoma of the larynx. EXAM: NUCLEAR MEDICINE PET SKULL BASE TO THIGH TECHNIQUE: 7.5 mCi F-18 FDG was injected  intravenously. Full-ring PET imaging was performed from the skull base to thigh after the radiotracer. CT data was obtained and used for attenuation correction and anatomic localization. Fasting blood glucose: 92 mg/dl COMPARISON:  CT neck and chest 06/10/2022. FINDINGS: Mediastinal blood pool activity: SUV max 2.9 Liver activity: SUV max NA NECK: Asymmetric right-sided soft tissue thickening involving the right vocal cord, SUV max 14.7. No hypermetabolic lymph nodes. Incidental CT findings: None. CHEST: No abnormal hypermetabolism. Incidental CT findings: Tracheostomy. Coronary artery calcification. Heart is at the upper limits of normal in size to mildly enlarged. No pericardial or pleural effusion. Mild centrilobular emphysema. Vague patchy peribronchovascular ground-glass in the anterior segment right upper lobe is new in the interval and therefore infectious/inflammatory in etiology. ABDOMEN/PELVIS: No abnormal hypermetabolism. Incidental CT findings: Liver, gallbladder, adrenal glands unremarkable. Tiny right renal stones. Kidneys, spleen, pancreas, stomach and bowel are otherwise grossly unremarkable. Atherosclerotic calcification of the aorta. SKELETON: No abnormal osseous hypermetabolism. Incidental CT findings: Degenerative changes in the spine.  Old bilateral rib fractures. IMPRESSION: 1. Hypermetabolic asymmetric soft tissue thickening along the right vocal cord, compatible with the provided history of laryngeal cancer. No evidence of metastatic disease. 2. Tiny right renal stones. 3. Aortic atherosclerosis (ICD10-I70.0). Coronary artery calcification. 4.  Emphysema (ICD10-J43.9). Electronically Signed   By: Lorin Picket M.D.   On: 07/06/2022 08:43   DG Swallowing Func-Speech Pathology  Result Date: 06/15/2022 Table formatting from the original result was not included. Images from the original result were not included. Objective Swallowing Evaluation: Type of Study: MBS-Modified Barium Swallow  Study  Patient Details Name: TAMAJ JURGENS MRN: [id number] Date of Birth: [birth year] Today's Date: 06/15/2022 Time: SLP Start Time (ACUTE ONLY): 1212 -SLP Stop Time (ACUTE ONLY): 1225 SLP Time Calculation (min) (ACUTE ONLY): 13 min Past Medical History: Past Medical History: Diagnosis Date  Asthma   as a child  Broken ribs   Dyspnea   Hypertension  Past Surgical History: Past Surgical History: Procedure Laterality Date  DIRECT LARYNGOSCOPY N/A 06/13/2022  Procedure: DIRECT LARYNGOSCOPY WITH BIOPSY;  Surgeon: Jenetta Downer, MD;  Location: [place];  Service: ENT;  Laterality: N/A;  NO PAST SURGERIES    TRACHEOSTOMY TUBE PLACEMENT N/A 06/13/2022  Procedure: AWAKE TRACHEOSTOMY;  Surgeon: Jenetta Downer, MD;  Location: [place];  Service: ENT;  Laterality: N/A; HPI: Pt is a 46 yo male who presented to [facility] 11/15 with several days of cough and dyspnea. 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 Pt returned [facility] ED on 11/16 and 11/18 pt underwent elective trach and laryngeal mass biopsies. CT showed asymmetric thickening of the right aryepiglottic fold and asymmetric enlargement of the right laryngeal ventricle and piriform sinus. This has progressed in the interval. 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 PMH includes: asthma, broken ribs, dyspnea, HTN  Subjective: alert and cooperative  Recommendations for follow up therapy are one component of a multi-disciplinary discharge planning process, led by the attending physician.  Recommendations may be updated based on patient status, additional functional criteria and insurance authorization. Assessment / Plan / Recommendation   06/15/2022   3:00 PM Clinical Impressions Clinical Impression Pt has a mild pharyngeal dysphagia with reduced base of tongue retraction and epiglottic inversion. There is mild vallecular residue, noted most with more solid foods. Laryngeal vestibule closure is relatively intact  considering location of mass. Penetration occurred with thin liquids before the swallow, but it was trace. What did not clear spontaneously was cleared easily with a cued throat clear. Recommend continuing with regular diet and thin liquids with use of PMV  for any PO intake and use of intermittent throat clear. SLP Visit Diagnosis Dysphagia, unspecified (R13.10) Impact on safety and function Mild aspiration risk     06/15/2022   3:00 PM Treatment Recommendations Treatment Recommendations Therapy as outlined in treatment plan below     06/15/2022   3:00 PM Prognosis Prognosis for Safe Diet Advancement Good   06/15/2022   3:00 PM Diet Recommendations SLP Diet Recommendations Regular solids;Thin liquid Liquid Administration via Cup;Straw Medication Administration Whole meds with liquid Compensations Slow rate;Small sips/bites;Clear throat intermittently Postural Changes Seated upright at 90 degrees     06/15/2022   3:00 PM Other Recommendations Oral Care Recommendations Oral care BID Other Recommendations Place PMSV during PO intake Follow Up Recommendations Outpatient SLP Functional Status Assessment Patient has had a recent decline in their functional status and demonstrates the ability to make significant improvements in function in a reasonable and predictable amount of time.   06/15/2022   3:00 PM Frequency and Duration  Speech Therapy Frequency (ACUTE ONLY) min 2x/week Treatment Duration 2 weeks      No data to display       06/15/2022   3:00 PM Pharyngeal Phase Pharyngeal Phase Impaired Pharyngeal- Thin Teaspoon Reduced epiglottic inversion;Reduced anterior laryngeal mobility;Reduced tongue base retraction;Pharyngeal residue - valleculae Pharyngeal- Thin Cup Reduced epiglottic inversion;Reduced anterior laryngeal mobility;Reduced tongue base retraction;Pharyngeal residue - valleculae Pharyngeal- Thin Straw Reduced epiglottic inversion;Reduced anterior laryngeal mobility;Reduced tongue base retraction;Pharyngeal  residue - valleculae;Penetration/Aspiration before swallow Pharyngeal Material enters airway, remains ABOVE vocal cords and not ejected out Pharyngeal- Puree Reduced anterior laryngeal mobility;Reduced tongue base retraction;Pharyngeal residue - valleculae Pharyngeal- Regular Reduced anterior laryngeal mobility;Reduced tongue base retraction;Pharyngeal residue - valleculae Pharyngeal- Pill Reduced anterior laryngeal mobility;Reduced tongue base retraction;Pharyngeal residue - valleculae    06/15/2022   3:00 PM Cervical Esophageal Phase  Cervical Esophageal Phase [facility] Osie Bond., M.A. [place] [facility] Office [phone] Secure chat preferred 06/15/2022, 3:17 PM                     CT CHEST W CONTRAST  Result Date: 06/14/2022 CLINICAL  DATA:  Head neck cancer, for staging EXAM: CT CHEST WITH CONTRAST TECHNIQUE: Multidetector CT imaging of the chest was performed during intravenous contrast administration. RADIATION DOSE REDUCTION: This exam was performed according to the departmental dose-optimization program which includes automated exposure control, adjustment of the mA and/or kV according to patient size and/or use of iterative reconstruction technique. CONTRAST:  78m OMNIPAQUE IOHEXOL 350 MG/ML SOLN COMPARISON:  Chest radiograph dated 06/13/2022. CTA chest dated 01/06/2022. FINDINGS: Cardiovascular: Heart is normal in size.  No pericardial effusion. No evidence of thoracic aortic aneurysm. Mediastinum/Nodes: No suspicious mediastinal lymphadenopathy. Retrosternal fluid/gas, likely reflecting postprocedural changes in the setting of recent tracheostomy. Visualized thyroid is unremarkable. Lungs/Pleura: Tracheostomy in satisfactory position. Mild centrilobular emphysematous changes, upper lung predominant. No focal consolidation. 3 mm nodule at the left lung apex (series 4/image 32), stable versus mildly improved. No new/suspicious pulmonary nodules. No pleural effusion or pneumothorax.  Upper Abdomen: Visualized upper abdomen is grossly unremarkable. Musculoskeletal: Visualized osseous structures are within normal limits. IMPRESSION: Tracheostomy in satisfactory position. Associated postprocedural changes. No findings suspicious for metastatic disease in the chest. 3 mm nodule at the left lung apex, stable versus mildly improved, benign. No follow-up is recommended. Emphysema (ICD10-J43.9). Electronically Signed   By: SJulian HyM.D.   On: 06/14/2022 21:55   DG Chest Port 1 View  Result Date: 06/13/2022 CLINICAL DATA:  Status post tracheostomy EXAM: PORTABLE CHEST 1 VIEW COMPARISON:  06/11/2022 FINDINGS: Tracheostomy tube is noted with tip 5.5 cm above the carina. The cardiomediastinal silhouette is unremarkable. There is no evidence of focal airspace disease, pulmonary edema, suspicious pulmonary nodule/mass, pleural effusion, or pneumothorax. No acute bony abnormalities are identified. Remote rib fractures again identified. IMPRESSION: Tracheostomy tube with tip 5.5 cm above the carina. No significant abnormalities. Electronically Signed   By: JMargarette CanadaM.D.   On: 06/13/2022 12:03   DG Chest 2 View  Result Date: 06/11/2022 CLINICAL DATA:  Shortness of breath EXAM: CHEST - 2 VIEW COMPARISON:  06/10/2022 FINDINGS: The heart size and mediastinal contours are within normal limits. Both lungs are clear. The visualized skeletal structures are unremarkable. IMPRESSION: No active cardiopulmonary disease. Electronically Signed   By: NDavina PokeD.O.   On: 06/11/2022 13:50   DG Wrist 2 Views Left  Result Date: 06/10/2022 CLINICAL DATA:  Fall pain EXAM: LEFT WRIST - 2 VIEW COMPARISON:  None Available. FINDINGS: There is no evidence of fracture or dislocation. There is no evidence of arthropathy or other focal bone abnormality. Soft tissues are unremarkable. IMPRESSION: Negative. Electronically Signed   By: KDonavan FoilM.D.   On: 06/10/2022 16:27   CT SOFT TISSUE NECK W  CONTRAST  Result Date: 06/10/2022 CLINICAL DATA:  Epiglottitis or tonsillitis. Follow-up previous abnormality right vocal cord. EXAM: CT NECK WITH CONTRAST TECHNIQUE: Multidetector CT imaging of the neck was performed using the standard protocol following the bolus administration of intravenous contrast. RADIATION DOSE REDUCTION: This exam was performed according to the departmental dose-optimization program which includes automated exposure control, adjustment of the mA and/or kV according to patient size and/or use of iterative reconstruction technique. CONTRAST:  74mOMNIPAQUE IOHEXOL 300 MG/ML  SOLN COMPARISON:  CT neck 01/06/2022 FINDINGS: Pharynx and larynx: Image quality degraded by extensive motion. 2 attempts were made to scan the neck both degraded by motion. Tonsils are symmetric.  No mass or abscess.  Epiglottis is normal. There is asymmetric thickening of the right aryepiglottic fold which has progressed in the interval. There is asymmetric enlargement of  the right laryngeal ventricle and piriform sinus, with progression from the prior study. No airway compromise Salivary glands: No inflammation, mass, or stone. Thyroid: Negative Lymph nodes: No enlarged lymph nodes in the neck. Vascular: Normal vascular enhancement. Mild atherosclerotic disease in the carotid artery bilaterally. Limited intracranial: Not imaged Visualized orbits: Negative Mastoids and visualized paranasal sinuses: Mild mucosal edema right and left maxillary sinus. Remaining sinuses clear. Mastoid clear. Skeleton: No acute abnormality Upper chest: Lung apices clear bilaterally Other: None IMPRESSION: 1. Image quality degraded by extensive motion. 2. Asymmetric thickening of the right aryepiglottic fold and asymmetric enlargement of the right laryngeal ventricle and piriform sinus. This has progressed in the interval. 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 Recommend laryngoscopy. 3. No tonsillar  abscess. No adenopathy in the neck. 4. Mild atherosclerotic disease in the carotid artery bilaterally. Electronically Signed   By: Franchot Gallo M.D.   On: 06/10/2022 13:38   DG Chest Port 1 View  Result Date: 06/10/2022 CLINICAL DATA:  Dyspnea EXAM: PORTABLE CHEST 1 VIEW COMPARISON:  01/06/2022 FINDINGS: Left basilar opacity likely represents a confluence of vascular and osseous shadows. The lungs are otherwise clear. No pneumothorax or pleural effusion. Cardiac size within normal limits. Pulmonary vascularity is normal. No acute bone abnormality. Healed bilateral rib fractures noted. IMPRESSION: 1. No active disease. Electronically Signed   By: Fidela Salisbury M.D.   On: 06/10/2022 01:45    All questions were answered. The patient knows to call the clinic with any problems, questions or concerns. I spent 45 minutes in the care of this patient including H and P, review of records, counseling and coordination of care.     Benay Pike, MD 07/06/2022 2:46 PM

## 2022-07-07 ENCOUNTER — Ambulatory Visit: Payer: Medicaid Other

## 2022-07-07 ENCOUNTER — Ambulatory Visit
Admission: RE | Admit: 2022-07-07 | Discharge: 2022-07-07 | Disposition: A | Discharge status: Home / Self Care | Payer: Medicaid Other | Source: Ambulatory Visit | Attending: Radiation Oncology | Admitting: Radiation Oncology

## 2022-07-07 DIAGNOSIS — C329 Malignant neoplasm of larynx, unspecified: Secondary | ICD-10-CM

## 2022-07-07 DIAGNOSIS — Z930 Tracheostomy status: Secondary | ICD-10-CM | POA: Diagnosis not present

## 2022-07-07 NOTE — Progress Notes (Incomplete)
Head and Neck Cancer Location of Tumor / Histology:  C32.9 (ICD-10-CM) - Squamous cell carcinoma of larynx ([place])    Patient presented with symptoms of:  History of present illness: ( per admitting provider Dr Flossie Buffy) HPI: Daniel Marshall is a 46 y.o. male with medical history significant of HTN,COPD, newly diagnosed right laryngeal mass presenting with persistent shortness of breath, cough.   Pt was admitted to [place] yesterday for respiratory distress requiring Bipap. 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 Biopsy and tracheostomy was recommended and he originally had plans to transfer from [facility] to ICU service here at [facility]. However, pt eventually wanted to do this outpatient and left AMA.    He presents today with continued shortness of breath and now agreeable to further intervention. Says he left because he wanted to discuss with family. Shortness of breath is worse with exertion. He has hoarseness of voice that has been ongoing for at least a year. Has 20 year half a pack smoking hx.    In the ED, temperature was 98.51F, HR of 110, BP of 146/84 on room air.    WBC of 10.6, hemoglobin of 14.2   Sodium 131, potassium 3.8, creatinine of 0.60   Repeat CXR today was negative.   EDP did discuss with CCM who feels the patient is stable for stepdown unit tonight and can be consult following his tracheostomy.   Biopsies revealed:  06-13-22 FINAL MICROSCOPIC DIAGNOSIS:   A.   LARYNGEAL MASS, RIGHT, BIOPSY:  -    Invasive squamous cell carcinoma, keratinizing.  COMMENT:  Dr. Saralyn Pilar has reviewed the slides (intradepartmental QA review for  malignancy).  GROSS DESCRIPTION:  Received fresh are 3 soft tan-red tissue fragments measuring 0.3 to 0.4  cm in greatest dimension.  The specimen is submitted in toto.  (GRP  06/15/2022)      Nutrition Status  Yes No Comments  Weight changes? '[]'$   '[]'$      Swallowing concerns? '[]'$   '[]'$      PEG? '[]'$   '[]'$        Referrals Yes No Comments  Social Work? '[]'$   '[]'$      Dentistry? '[]'$   '[]'$      Swallowing therapy? '[]'$   '[]'$      Nutrition? '[]'$   '[]'$      Med/Onc? '[]'$   '[]'$        Safety Issues Yes No Comments  Prior radiation? '[]'$   '[]'$      Pacemaker/ICD? '[]'$   '[]'$      Possible current pregnancy? '[]'$   '[]'$      Is the patient on methotrexate? '[]'$   '[]'$        Tobacco/Marijuana/Snuff/ETOH use: yes, smoker   Past/Anticipated interventions by otolaryngology, if any: Dr. Sabino Gasser 06-13-22 Pre-Op Diagnosis: LARYNGEAL MASS  Post-Op Diagnosis: LARYNGEAL MASS  Procedure: Procedure(s): DIRECT LARYNGOSCOPY WITH BIOPSY AWAKE TRACHEOSTOMY  Anesthesia: Monitor Anesthesia Care  Surgeon(s): Pamala Hurry, MD   Past/Anticipated interventions by medical oncology, if any:  Pt to have PET scan on 12-8 and see Dr. Chryl Heck on 07-06-22         Current Complaints / other details:  ***

## 2022-07-07 NOTE — Progress Notes (Incomplete)
Radiation Oncology         [phone] ________________________________  Initial Outpatient Consultation  Name: Daniel Marshall MRN: [id number]  Date: 07/08/2022  DOB: 11-Jan-1976  CC:Pcp, No  Jenetta Downer, MD   REFERRING PHYSICIAN: Jenetta Downer, MD  DIAGNOSIS:    ICD-10-CM   1. 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 male who was diagnosed with COPD in 12/2021 after mild emphysematous changes noted on a chest CT performed in the ED on 01/06/22 (presented then with SOB, throat tightness, and hoarseness). He was then referred to ENT for a laryngoscopy due to an abnormal neck CT (performed in the ED on 01/06/22) which showed concern for a right vocal cord abnormality. However he was lost to follow up.   In more recent history, the patient presented to the ED on 06/10/22 with the cc of worsening SOB x 3 days, and an associated non-productive cough. On arrival to the ED, he was noted to have wheezing with poor air movement and he was placed on a BiPAP (soon after transitioned to a nasal cannula). He was admitted to [facility] and started on solumedrol, duonebs, and empiric ceftriaxone/ azithro.  He however developed worsening SOB and required BiPAP again, and was later able to transition back to room air. CT soft tissues was repeated which showed asymmetric thickening of the right aryepiglottic fold and asymmetric enlargement of the right laryngeal ventricle and piriform sinus, (progressed since prior imaging). ENT was later consulted and performed a bedside fiberoptic exam which revealed a right larynx mass with vocal cord paresis and a narrowed airway, however this exam was further limited due to gagging and coughing. The patient was strongly advised to transfer to [facility] and undergo biopsy and tracheostomy by ENT, however  the patient opted to leave AMA on the day of his admission.  The patient returned to the ED the following day (06/11/22) with ongoing SOB, and he agreed to admission and further intervention.   Direct laryngoscopy for biopsy of the right laryngeal mass on 06/13/22 while inpatient revealed invasive squamous cell carcinoma (keratinizing). He also underwent a tracheostomy at this time.   Chest CT with contrast performed while inpatient on 06/14/22 did not show any evidence of further metastatic disease in the chest, and stability of a 3 mm nodule at the left lung apex.  Following discharge, the patient was accordingly referred to Dr. Sabino Gasser (Otolaryngology) on 06/26/22. During this visit, the patient denied any issues maintaining trach. In terms of treatment moving forward, Dr. Sabino Gasser discussed the roles of definitive chemoradiation and laryngectomy pending further discussion with both medical and surgical oncology.   Pertinent imaging thus far includes a PET scan performed on 07/03/22 revealing hypermetabolic asymmetric soft tissue thickening along the right vocal cord, compatible with the patient's diagnosis of laryngeal cancer.    Following discussion with Dr. Chryl Heck on 07/06/22, and based on PET findings which show no evidence of metastatic disease, the patient is open to considering surgical resectioning (pending further discussion with ENT). If he is deemed to not be a good surgical candidate, Dr. Chryl Heck may consider him for concurrent chemoradiation.   Swallowing issues, if any: throat tightness and hoarseness  - S/p tracheostomy on 06/13/22  - Barium swallow study on 06/15/22 showed mild pharyngeal dysphagia   Weight Changes: none  Pain status: ***  Other symptoms: hoarseness x 1 year, SOB with exertion and at rest, non-productive cough   Tobacco history, if any: smoked half a pack for over 20 years  ETOH abuse, if any: reports current/recent alcohol use  Prior cancers, if any:  none  PREVIOUS RADIATION THERAPY: No  PAST MEDICAL HISTORY:  has a past medical history of Asthma, Broken ribs, Dyspnea, and Hypertension.    PAST SURGICAL HISTORY: Past Surgical History:  Procedure Laterality Date   DIRECT LARYNGOSCOPY N/A 06/13/2022   Procedure: DIRECT LARYNGOSCOPY WITH BIOPSY;  Surgeon: Jenetta Downer, MD;  Location: [place];  Service: ENT;  Laterality: N/A;   NO PAST SURGERIES     TRACHEOSTOMY TUBE PLACEMENT N/A 06/13/2022   Procedure: AWAKE TRACHEOSTOMY;  Surgeon: Jenetta Downer, MD;  Location: [place];  Service: ENT;  Laterality: N/A;    FAMILY HISTORY: Family history is unknown by patient.  SOCIAL HISTORY:  reports that he has been smoking cigarettes. He has never used smokeless tobacco. He reports current alcohol use. He reports current drug use. Drug: Marijuana.  ALLERGIES: Patient has no known allergies.  MEDICATIONS:  Current Outpatient Medications  Medication Sig Dispense Refill   ibuprofen (ADVIL) 200 MG tablet Take 200-600 mg by mouth every 6 (six) hours as needed for fever or headache (pain).     losartan (COZAAR) 50 MG tablet Take 1 tablet (50 mg total) by mouth daily. 30 tablet 0   No current facility-administered medications for this encounter.    REVIEW OF SYSTEMS:  Notable for that above.   PHYSICAL EXAM:  vitals were not taken for this visit.   General: Alert and oriented, in no acute distress HEENT: Head is normocephalic. Extraocular movements are intact. Oropharynx is notable for ***. Neck: Neck is notable for *** Heart: Regular in rate and rhythm with no murmurs, rubs, or gallops. Chest: Clear to auscultation bilaterally, with no rhonchi, wheezes, or rales. Abdomen: Soft, nontender, nondistended, with no rigidity or guarding. Extremities: No cyanosis or edema. Lymphatics: see Neck Exam Skin: No concerning lesions. Musculoskeletal: symmetric strength and muscle tone throughout. Neurologic: Cranial nerves II through XII are grossly  intact. No obvious focalities. Speech is fluent. Coordination is intact. Psychiatric: Judgment and insight are intact. Affect is appropriate.   ECOG = ***  0 - Asymptomatic (Fully active, able to carry on all predisease activities without restriction)  1 - Symptomatic but completely ambulatory (Restricted in physically strenuous activity but ambulatory and able to carry out work of a light or sedentary nature. For example, light housework, office work)  2 - Symptomatic, <50% in bed during the day (Ambulatory and capable of all self care but unable to carry out any work activities. Up and about more than 50% of waking hours)  3 - Symptomatic, >50% in bed, but not bedbound (Capable of only limited self-care, confined to bed or chair 50% or more of waking hours)  4 - Bedbound (Completely disabled. Cannot carry on any self-care. Totally confined to bed or chair)  5 - Death   Eustace Pen MM, Creech RH, Tormey DC, et al. [phone]). "Toxicity and response criteria of the [facility]". [place] Oncol. 5 (6): 649-55   LABORATORY DATA:  Lab Results  Component Value Date   WBC 9.3 06/19/2022   HGB 13.4 06/19/2022   HCT 38.1 (L) 06/19/2022   MCV 85.6 06/19/2022   PLT 317 06/19/2022   CMP     Component Value Date/Time   NA 132 (  L) 06/19/2022 0641   NA 135 11/25/2017 1621   K 4.2 06/19/2022 0641   CL 95 (L) 06/19/2022 0641   CO2 25 06/19/2022 0641   GLUCOSE 99 06/19/2022 0641   BUN 6 06/19/2022 0641   BUN 15 11/25/2017 1621   CREATININE 0.85 06/19/2022 0641   CREATININE 0.93 05/05/2017 1445   CALCIUM 9.1 06/19/2022 0641   PROT 7.8 06/11/2022 1348   ALBUMIN 3.5 06/11/2022 1348   AST 67 (H) 06/11/2022 1348   ALT 31 06/11/2022 1348   ALKPHOS 74 06/11/2022 1348   BILITOT 0.6 06/11/2022 1348   GFRNONAA >60 06/19/2022 0641   GFRNONAA 102 05/05/2017 1445   GFRAA >60 01/18/2018 0442   GFRAA 118 05/05/2017 1445      Lab Results  Component Value Date   TSH 1.87  05/05/2017     RADIOGRAPHY: NM PET Image Initial (PI) Skull Base To Thigh  Result Date: 07/06/2022 CLINICAL DATA:  Initial treatment strategy for squamous cell carcinoma of the larynx. EXAM: NUCLEAR MEDICINE PET SKULL BASE TO THIGH TECHNIQUE: 7.5 mCi F-18 FDG was injected intravenously. Full-ring PET imaging was performed from the skull base to thigh after the radiotracer. CT data was obtained and used for attenuation correction and anatomic localization. Fasting blood glucose: 92 mg/dl COMPARISON:  CT neck and chest 06/10/2022. FINDINGS: Mediastinal blood pool activity: SUV max 2.9 Liver activity: SUV max NA NECK: Asymmetric right-sided soft tissue thickening involving the right vocal cord, SUV max 14.7. No hypermetabolic lymph nodes. Incidental CT findings: None. CHEST: No abnormal hypermetabolism. Incidental CT findings: Tracheostomy. Coronary artery calcification. Heart is at the upper limits of normal in size to mildly enlarged. No pericardial or pleural effusion. Mild centrilobular emphysema. Vague patchy peribronchovascular ground-glass in the anterior segment right upper lobe is new in the interval and therefore infectious/inflammatory in etiology. ABDOMEN/PELVIS: No abnormal hypermetabolism. Incidental CT findings: Liver, gallbladder, adrenal glands unremarkable. Tiny right renal stones. Kidneys, spleen, pancreas, stomach and bowel are otherwise grossly unremarkable. Atherosclerotic calcification of the aorta. SKELETON: No abnormal osseous hypermetabolism. Incidental CT findings: Degenerative changes in the spine.  Old bilateral rib fractures. IMPRESSION: 1. Hypermetabolic asymmetric soft tissue thickening along the right vocal cord, compatible with the provided history of laryngeal cancer. No evidence of metastatic disease. 2. Tiny right renal stones. 3. Aortic atherosclerosis (ICD10-I70.0). Coronary artery calcification. 4.  Emphysema (ICD10-J43.9). Electronically Signed   By: Lorin Picket M.D.    On: 07/06/2022 08:43   DG Swallowing Func-Speech Pathology  Result Date: 06/15/2022 Table formatting from the original result was not included. Images from the original result were not included. Objective Swallowing Evaluation: Type of Study: MBS-Modified Barium Swallow Study  Patient Details Name: Daniel Marshall MRN: [id number] Date of Birth: 08/27/1975 Today's Date: 06/15/2022 Time: SLP Start Time (ACUTE ONLY): 1212 -SLP Stop Time (ACUTE ONLY): 1225 SLP Time Calculation (min) (ACUTE ONLY): 13 min Past Medical History: Past Medical History: Diagnosis Date  Asthma   as a child  Broken ribs   Dyspnea   Hypertension  Past Surgical History: Past Surgical History: Procedure Laterality Date  DIRECT LARYNGOSCOPY N/A 06/13/2022  Procedure: DIRECT LARYNGOSCOPY WITH BIOPSY;  Surgeon: Jenetta Downer, MD;  Location: [place];  Service: ENT;  Laterality: N/A;  NO PAST SURGERIES    TRACHEOSTOMY TUBE PLACEMENT N/A 06/13/2022  Procedure: AWAKE TRACHEOSTOMY;  Surgeon: Jenetta Downer, MD;  Location: [place];  Service: ENT;  Laterality: N/A; HPI: Pt is a 46 yo male who presented to [facility]/15 with several days  of cough and dyspnea. 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 Pt returned to [facility] ED on 11/16 and 11/18 pt underwent elective trach and laryngeal mass biopsies. CT showed asymmetric thickening of the right aryepiglottic fold and asymmetric enlargement of the right laryngeal ventricle and piriform sinus. This has progressed in the interval. 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 PMH includes: asthma, broken ribs, dyspnea, HTN  Subjective: alert and cooperative  Recommendations for follow up therapy are one component of a multi-disciplinary discharge planning process, led by the attending physician.  Recommendations may be updated based on patient status, additional functional criteria and insurance authorization. Assessment / Plan / Recommendation   06/15/2022    3:00 PM Clinical Impressions Clinical Impression Pt has a mild pharyngeal dysphagia with reduced base of tongue retraction and epiglottic inversion. There is mild vallecular residue, noted most with more solid foods. Laryngeal vestibule closure is relatively intact considering location of mass. Penetration occurred with thin liquids before the swallow, but it was trace. What did not clear spontaneously was cleared easily with a cued throat clear. Recommend continuing with regular diet and thin liquids with use of PMV  for any PO intake and use of intermittent throat clear. SLP Visit Diagnosis Dysphagia, unspecified (R13.10) Impact on safety and function Mild aspiration risk     06/15/2022   3:00 PM Treatment Recommendations Treatment Recommendations Therapy as outlined in treatment plan below     06/15/2022   3:00 PM Prognosis Prognosis for Safe Diet Advancement Good   06/15/2022   3:00 PM Diet Recommendations SLP Diet Recommendations Regular solids;Thin liquid Liquid Administration via Cup;Straw Medication Administration Whole meds with liquid Compensations Slow rate;Small sips/bites;Clear throat intermittently Postural Changes Seated upright at 90 degrees     06/15/2022   3:00 PM Other Recommendations Oral Care Recommendations Oral care BID Other Recommendations Place PMSV during PO intake Follow Up Recommendations Outpatient SLP Functional Status Assessment Patient has had a recent decline in their functional status and demonstrates the ability to make significant improvements in function in a reasonable and predictable amount of time.   06/15/2022   3:00 PM Frequency and Duration  Speech Therapy Frequency (ACUTE ONLY) min 2x/week Treatment Duration 2 weeks      No data to display       06/15/2022   3:00 PM Pharyngeal Phase Pharyngeal Phase Impaired Pharyngeal- Thin Teaspoon Reduced epiglottic inversion;Reduced anterior laryngeal mobility;Reduced tongue base retraction;Pharyngeal residue - valleculae  Pharyngeal- Thin Cup Reduced epiglottic inversion;Reduced anterior laryngeal mobility;Reduced tongue base retraction;Pharyngeal residue - valleculae Pharyngeal- Thin Straw Reduced epiglottic inversion;Reduced anterior laryngeal mobility;Reduced tongue base retraction;Pharyngeal residue - valleculae;Penetration/Aspiration before swallow Pharyngeal Material enters airway, remains ABOVE vocal cords and not ejected out Pharyngeal- Puree Reduced anterior laryngeal mobility;Reduced tongue base retraction;Pharyngeal residue - valleculae Pharyngeal- Regular Reduced anterior laryngeal mobility;Reduced tongue base retraction;Pharyngeal residue - valleculae Pharyngeal- Pill Reduced anterior laryngeal mobility;Reduced tongue base retraction;Pharyngeal residue - valleculae    06/15/2022   3:00 PM Cervical Esophageal Phase  Cervical Esophageal Phase [facility] Osie Bond., M.A. [place] [facility] Office [phone] Secure chat preferred 06/15/2022, 3:17 PM                     CT CHEST W CONTRAST  Result Date: 06/14/2022 CLINICAL DATA:  Head neck cancer, for staging EXAM: CT CHEST WITH CONTRAST TECHNIQUE: Multidetector CT imaging of the chest was performed during intravenous contrast administration. RADIATION DOSE REDUCTION: This  exam was performed according to the departmental dose-optimization program which includes automated exposure control, adjustment of the mA and/or kV according to patient size and/or use of iterative reconstruction technique. CONTRAST:  29m OMNIPAQUE IOHEXOL 350 MG/ML SOLN COMPARISON:  Chest radiograph dated 06/13/2022. CTA chest dated 01/06/2022. FINDINGS: Cardiovascular: Heart is normal in size.  No pericardial effusion. No evidence of thoracic aortic aneurysm. Mediastinum/Nodes: No suspicious mediastinal lymphadenopathy. Retrosternal fluid/gas, likely reflecting postprocedural changes in the setting of recent tracheostomy. Visualized thyroid is unremarkable. Lungs/Pleura: Tracheostomy  in satisfactory position. Mild centrilobular emphysematous changes, upper lung predominant. No focal consolidation. 3 mm nodule at the left lung apex (series 4/image 32), stable versus mildly improved. No new/suspicious pulmonary nodules. No pleural effusion or pneumothorax. Upper Abdomen: Visualized upper abdomen is grossly unremarkable. Musculoskeletal: Visualized osseous structures are within normal limits. IMPRESSION: Tracheostomy in satisfactory position. Associated postprocedural changes. No findings suspicious for metastatic disease in the chest. 3 mm nodule at the left lung apex, stable versus mildly improved, benign. No follow-up is recommended. Emphysema (ICD10-J43.9). Electronically Signed   By: SJulian HyM.D.   On: 06/14/2022 21:55   DG Chest Port 1 View  Result Date: 06/13/2022 CLINICAL DATA:  Status post tracheostomy EXAM: PORTABLE CHEST 1 VIEW COMPARISON:  06/11/2022 FINDINGS: Tracheostomy tube is noted with tip 5.5 cm above the carina. The cardiomediastinal silhouette is unremarkable. There is no evidence of focal airspace disease, pulmonary edema, suspicious pulmonary nodule/mass, pleural effusion, or pneumothorax. No acute bony abnormalities are identified. Remote rib fractures again identified. IMPRESSION: Tracheostomy tube with tip 5.5 cm above the carina. No significant abnormalities. Electronically Signed   By: JMargarette CanadaM.D.   On: 06/13/2022 12:03   DG Chest 2 View  Result Date: 06/11/2022 CLINICAL DATA:  Shortness of breath EXAM: CHEST - 2 VIEW COMPARISON:  06/10/2022 FINDINGS: The heart size and mediastinal contours are within normal limits. Both lungs are clear. The visualized skeletal structures are unremarkable. IMPRESSION: No active cardiopulmonary disease. Electronically Signed   By: NDavina PokeD.O.   On: 06/11/2022 13:50   DG Wrist 2 Views Left  Result Date: 06/10/2022 CLINICAL DATA:  Fall pain EXAM: LEFT WRIST - 2 VIEW COMPARISON:  None Available.  FINDINGS: There is no evidence of fracture or dislocation. There is no evidence of arthropathy or other focal bone abnormality. Soft tissues are unremarkable. IMPRESSION: Negative. Electronically Signed   By: KDonavan FoilM.D.   On: 06/10/2022 16:27   CT SOFT TISSUE NECK W CONTRAST  Result Date: 06/10/2022 CLINICAL DATA:  Epiglottitis or tonsillitis. Follow-up previous abnormality right vocal cord. EXAM: CT NECK WITH CONTRAST TECHNIQUE: Multidetector CT imaging of the neck was performed using the standard protocol following the bolus administration of intravenous contrast. RADIATION DOSE REDUCTION: This exam was performed according to the departmental dose-optimization program which includes automated exposure control, adjustment of the mA and/or kV according to patient size and/or use of iterative reconstruction technique. CONTRAST:  776mOMNIPAQUE IOHEXOL 300 MG/ML  SOLN COMPARISON:  CT neck 01/06/2022 FINDINGS: Pharynx and larynx: Image quality degraded by extensive motion. 2 attempts were made to scan the neck both degraded by motion. Tonsils are symmetric.  No mass or abscess.  Epiglottis is normal. There is asymmetric thickening of the right aryepiglottic fold which has progressed in the interval. There is asymmetric enlargement of the right laryngeal ventricle and piriform sinus, with progression from the prior study. No airway compromise Salivary glands: No inflammation, mass, or stone. Thyroid: Negative Lymph nodes: No enlarged  lymph nodes in the neck. Vascular: Normal vascular enhancement. Mild atherosclerotic disease in the carotid artery bilaterally. Limited intracranial: Not imaged Visualized orbits: Negative Mastoids and visualized paranasal sinuses: Mild mucosal edema right and left maxillary sinus. Remaining sinuses clear. Mastoid clear. Skeleton: No acute abnormality Upper chest: Lung apices clear bilaterally Other: None IMPRESSION: 1. Image quality degraded by extensive motion. 2. Asymmetric  thickening of the right aryepiglottic fold and asymmetric enlargement of the right laryngeal ventricle and piriform sinus. This has progressed in the interval. 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 Recommend laryngoscopy. 3. No tonsillar abscess. No adenopathy in the neck. 4. Mild atherosclerotic disease in the carotid artery bilaterally. Electronically Signed   By: Franchot Gallo M.D.   On: 06/10/2022 13:38   DG Chest Port 1 View  Result Date: 06/10/2022 CLINICAL DATA:  Dyspnea EXAM: PORTABLE CHEST 1 VIEW COMPARISON:  01/06/2022 FINDINGS: Left basilar opacity likely represents a confluence of vascular and osseous shadows. The lungs are otherwise clear. No pneumothorax or pleural effusion. Cardiac size within normal limits. Pulmonary vascularity is normal. No acute bone abnormality. Healed bilateral rib fractures noted. IMPRESSION: 1. No active disease. Electronically Signed   By: Fidela Salisbury M.D.   On: 06/10/2022 01:45      IMPRESSION/PLAN:  This is a delightful patient with head and neck cancer. I *** recommend radiotherapy for this patient.  We discussed the potential risks, benefits, and side effects of radiotherapy. We talked in detail about acute and late effects. We discussed that some of the most bothersome acute effects may be mucositis, dysgeusia, salivary changes, skin irritation, hair loss, dehydration, weight loss and fatigue. We talked about late effects which include but are not necessarily limited to dysphagia, hypothyroidism, nerve injury, vascular injury, spinal cord injury, xerostomia, trismus, neck edema, and potential injury to any of the tissues in the head and neck region. No guarantees of treatment were given. A consent form was signed and placed in the patient's medical record. The patient is enthusiastic about proceeding with treatment. I look forward to participating in the patient's care.    Simulation (treatment planning) will take place  ***  We also discussed that the treatment of head and neck cancer is a multidisciplinary process to maximize treatment outcomes and quality of life. For this reason the following referrals have been or will be made:  *** Medical oncology to discuss chemotherapy   *** Dentistry for dental evaluation, possible extractions in the radiation fields, and /or advice on reducing risk of cavities, osteoradionecrosis, or other oral issues.  *** Nutritionist for nutrition support during and after treatment.  *** Speech language pathology for swallowing and/or speech therapy.  *** Social work for social support.   *** Physical therapy due to risk of lymphedema in neck and deconditioning.  *** Baseline labs including TSH.  On date of service, in total, I spent *** minutes on this encounter. Patient was seen in person.  __________________________________________   Eppie Gibson, MD  This document serves as a record of services personally performed by Eppie Gibson, MD. It was created on her behalf by Roney Mans, a trained medical scribe. The creation of this record is based on the scribe's personal observations and the provider's statements to them. This document has been checked and approved by the attending provider.

## 2022-07-08 ENCOUNTER — Ambulatory Visit
Admission: RE | Admit: 2022-07-08 | Discharge: 2022-07-08 | Disposition: A | Discharge status: Home / Self Care | Payer: Medicaid Other | Source: Ambulatory Visit | Attending: Radiation Oncology | Admitting: Radiation Oncology

## 2022-07-08 ENCOUNTER — Telehealth: Payer: Self-pay | Admitting: Radiation Oncology

## 2022-07-08 ENCOUNTER — Telehealth: Payer: Self-pay

## 2022-07-08 ENCOUNTER — Ambulatory Visit: Admission: RE | Admit: 2022-07-08 | Payer: Medicaid Other | Source: Ambulatory Visit

## 2022-07-08 DIAGNOSIS — C329 Malignant neoplasm of larynx, unspecified: Secondary | ICD-10-CM

## 2022-07-08 NOTE — Telephone Encounter (Signed)
Pt called Rn back and stated he overslept due to pt taking pain medication. He stated he would like to reschedule as soon as possible. Rn instructed pt we would be in touch to reschedule his consult soon.

## 2022-07-08 NOTE — Telephone Encounter (Signed)
LVM to r/s missed appt with Dr. Isidore Moos

## 2022-07-08 NOTE — Telephone Encounter (Signed)
Rn called pt when he did not show up for his 8:30 nurse eval. Rn left message at 8:45 and 9 am. No answer with either call.

## 2022-07-09 ENCOUNTER — Telehealth: Payer: Self-pay | Admitting: Radiation Oncology

## 2022-07-09 NOTE — Telephone Encounter (Signed)
LVM to r/s missed appt with Dr. Isidore Moos

## 2022-07-09 NOTE — Progress Notes (Incomplete)
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 male with medical history significant of HTN,COPD, newly diagnosed right laryngeal mass presenting with persistent shortness of breath, cough.   Pt was admitted to [facility] yesterday for respiratory distress requiring Bipap. 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 Biopsy and tracheostomy was recommended and he originally had plans to transfer from [facility] to ICU service here at [facility]. However, pt eventually wanted to do this outpatient and left AMA.    He presents today with continued shortness of breath and now agreeable to further intervention. Says he left because he wanted to discuss with family. Shortness of breath is worse with exertion. He has hoarseness of voice that has been ongoing for at least a year. Has 20 year half a pack smoking hx.    In the ED, temperature was 98.67F, HR of 110, BP of 146/84 on room air.    WBC of 10.6, hemoglobin of 14.2   Sodium 131, potassium 3.8, creatinine of 0.60   Repeat CXR today was negative.   EDP did discuss with CCM who feels the patient is stable for stepdown unit tonight and can be consult following his tracheostomy.   Biopsies revealed:  06-13-22 FINAL MICROSCOPIC DIAGNOSIS:   A.   LARYNGEAL MASS, RIGHT, BIOPSY:  -    Invasive squamous cell carcinoma, keratinizing.  COMMENT:  Dr. Saralyn Pilar has reviewed the slides (intradepartmental QA review for  malignancy).  GROSS DESCRIPTION:  Received fresh are 3 soft tan-red tissue fragments measuring 0.3 to 0.4  cm in greatest dimension.  The specimen is submitted in toto.  (GRP  06/15/2022)      Nutrition Status  Yes No Comments  Weight changes? '[]'$   '[]'$      Swallowing concerns? '[]'$   '[]'$      PEG? '[]'$   '[]'$        Referrals Yes No Comments  Social Work? '[]'$   '[]'$      Dentistry? '[]'$   '[]'$      Swallowing therapy? '[]'$   '[]'$      Nutrition? '[]'$   '[]'$      Med/Onc? '[]'$   '[]'$        Safety Issues Yes No Comments  Prior radiation? '[]'$   '[]'$      Pacemaker/ICD? '[]'$   '[]'$      Possible current pregnancy? '[]'$   '[]'$      Is the patient on methotrexate? '[]'$   '[]'$        Tobacco/Marijuana/Snuff/ETOH use: yes, smoker   Past/Anticipated interventions by otolaryngology, if any: Dr. Sabino Gasser 06-13-22 Pre-Op Diagnosis: LARYNGEAL MASS  Post-Op Diagnosis: LARYNGEAL MASS  Procedure: Procedure(s): DIRECT LARYNGOSCOPY WITH BIOPSY AWAKE TRACHEOSTOMY  Anesthesia: Monitor Anesthesia Care  Surgeon(s): Pamala Hurry, MD   Past/Anticipated interventions by medical oncology, if any:  Pt to have PET scan on 12-8  Dr. 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   We have discussed his PET/CT imaging which did not show any evidence of metastatic disease or pathologic lymphadenopathy.  I agree that he should be  considered for surgical resection and should follow-up with the ENT team at [facility] for recommendations.  If he is not a surgical candidate, we can certainly consider concurrent chemo RT as choice of treatment.  I briefly discussed with him about chemotherapy, schedule, toxicity which includes but not limited to fatigue, nausea, vomiting, increased risk of infections, neuropathy, ototoxicity and nephrotoxicity.  He is willing to consider all options.  He will follow-up with Dr. Nicolette Bang and return to clinic with for additional recommendations in about a week or 2.   No orders of the defined types were placed in this encounter.        Current Complaints / other details:  ***

## 2022-07-13 ENCOUNTER — Other Ambulatory Visit (HOSPITAL_COMMUNITY): Payer: Medicaid Other | Admitting: Dentistry

## 2022-07-13 DIAGNOSIS — C329 Malignant neoplasm of larynx, unspecified: Secondary | ICD-10-CM | POA: Diagnosis not present

## 2022-07-13 DIAGNOSIS — Z720 Tobacco use: Secondary | ICD-10-CM | POA: Diagnosis not present

## 2022-07-13 DIAGNOSIS — Z930 Tracheostomy status: Secondary | ICD-10-CM | POA: Diagnosis not present

## 2022-07-14 ENCOUNTER — Inpatient Hospital Stay (HOSPITAL_BASED_OUTPATIENT_CLINIC_OR_DEPARTMENT_OTHER): Payer: Medicaid Other | Admitting: Hematology and Oncology

## 2022-07-14 ENCOUNTER — Encounter: Payer: Self-pay | Admitting: Hematology and Oncology

## 2022-07-14 DIAGNOSIS — C329 Malignant neoplasm of larynx, unspecified: Secondary | ICD-10-CM | POA: Diagnosis not present

## 2022-07-14 NOTE — Assessment & Plan Note (Signed)
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  We have discussed his PET/CT imaging which did not show any evidence of metastatic disease or pathologic lymphadenopathy.  I agree that he should be considered for surgical resection and should follow-up with the ENT team at [facility] for recommendations.  He has been to the ENT team here and would like to proceed with surgery.  At this time since he most likely will not need adjuvant chemotherapy, we will follow-up with him as needed.  He was encouraged to keep appointment with Dr. Isidore Moos tomorrow.

## 2022-07-14 NOTE — Progress Notes (Signed)
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  We have discussed his PET/CT imaging which did not show any evidence of metastatic disease or pathologic lymphadenopathy.  I agree that he should be considered for surgical resection and should follow-up with the ENT team at [facility] for recommendations.  He has been to the ENT team here and would like to proceed with surgery.  At this time since he most likely will not need adjuvant chemotherapy, we will follow-up with him as needed.  He was encouraged to keep appointment with Dr. Isidore Moos tomorrow.  No orders of the defined types were placed in this encounter.    HISTORY OF PRESENTING ILLNESS:  Daniel Marshall 46 y.o. male is here because of SCC larynx.  This is a 46 year old male patient with past medical history significant for smoking and alcohol intake who was seen by Dr. Rutherford Limerick in ENT with chief complaint of hoarseness of voice.  He was found to have right supraglottic/glottic squamous cell carcinoma admitted for tracheostomy and had biopsy at the same time.  He had PET/CT 3 days ago which did not show any evidence of lymphadenopathy or metastatic disease.  He has been following up with the ENT team of [facility], Dr. Nicolette Bang for surgical recommendations.  He has a follow-up appointment with them tomorrow.  He does not see a doctor on a regular basis.  He has newly diagnosed hypertension and has been taking antihypertensive otherwise denies any complaints.  Today he reports a lot of confusion all  recommendations from both [facility] as well as colon.  Tells me that he does not quite understand why he has to repeat another scan to [place] if he just had a PET/CT.  He also appears overwhelmed by all his appointments which is understandably.  His last cigarette was on Saturday. He drinks beer on a regular basis.  Staging showed hypermetabolic asymmetric soft tissue thickening along the right vocal cord compatible with prior history of laryngeal cancer.  He is here for telephone visit today to review recommendations.  He apparently spoke to Dr. Rennis Chris from ENT yesterday and wants to pursue surgery.  He already spoke to the ENT team at [facility]. No other concerning review of systems reported today.   MEDICAL HISTORY:  Past Medical History:  Diagnosis Date   Asthma    as a child   Broken ribs    Dyspnea    Hypertension     SURGICAL HISTORY: Past Surgical History:  Procedure Laterality Date   DIRECT LARYNGOSCOPY N/A 06/13/2022   Procedure: DIRECT LARYNGOSCOPY WITH BIOPSY;  Surgeon: Jenetta Downer, MD;  Location: [place] OR;  Service: ENT;  Laterality: N/A;   NO PAST SURGERIES     TRACHEOSTOMY TUBE PLACEMENT N/A 06/13/2022   Procedure: AWAKE TRACHEOSTOMY;  Surgeon: Jenetta Downer, MD;  Location: [place];  Service: ENT;  Laterality: N/A;    SOCIAL HISTORY: Social History   Socioeconomic History   Marital status: Single    Spouse name: Not on file   Number of children: Not on file  Years of education: Not on file   Highest education level: Not on file  Occupational History   Not on file  Tobacco Use   Smoking status: Every Day    Types: Cigarettes   Smokeless tobacco: Never  Substance and Sexual Activity   Alcohol use: Yes    Comment: socially   Drug use: Yes    Types: Marijuana   Sexual activity: Not on file  Other Topics Concern   Not on file  Social History Narrative   Not on file   Social Determinants of Health   Financial Resource Strain: Not on  file  Food Insecurity: No Food Insecurity (06/14/2022)   Hunger Vital Sign    Worried About Running Out of Food in the Last Year: Never true    Ran Out of Food in the Last Year: Never true  Transportation Needs: No Transportation Needs (06/14/2022)   PRAPARE - Hydrologist (Medical): No    Lack of Transportation (Non-Medical): No  Physical Activity: Not on file  Stress: Not on file  Social Connections: Not on file  Intimate Partner Violence: Not At Risk (06/14/2022)   Humiliation, Afraid, Rape, and Kick questionnaire    Fear of Current or Ex-Partner: No    Emotionally Abused: No    Physically Abused: No    Sexually Abused: No    FAMILY HISTORY: Family History  Family history unknown: Yes    ALLERGIES:  has No Known Allergies.  MEDICATIONS:  Current Outpatient Medications  Medication Sig Dispense Refill   ibuprofen (ADVIL) 200 MG tablet Take 200-600 mg by mouth every 6 (six) hours as needed for fever or headache (pain).     losartan (COZAAR) 50 MG tablet Take 1 tablet (50 mg total) by mouth daily. 30 tablet 0   No current facility-administered medications for this visit.     PHYSICAL EXAMINATION: ECOG PERFORMANCE STATUS: 1 - Symptomatic but completely ambulatory  There were no vitals filed for this visit.  There were no vitals filed for this visit.  Physical examination not done, telephone visit  LABORATORY DATA:  I have reviewed the data as listed Lab Results  Component Value Date   WBC 9.3 06/19/2022   HGB 13.4 06/19/2022   HCT 38.1 (L) 06/19/2022   MCV 85.6 06/19/2022   PLT 317 06/19/2022     Chemistry      Component Value Date/Time   NA 132 (L) 06/19/2022 0641   NA 135 11/25/2017 1621   K 4.2 06/19/2022 0641   CL 95 (L) 06/19/2022 0641   CO2 25 06/19/2022 0641   BUN 6 06/19/2022 0641   BUN 15 11/25/2017 1621   CREATININE 0.85 06/19/2022 0641   CREATININE 0.93 05/05/2017 1445      Component Value Date/Time   CALCIUM  9.1 06/19/2022 0641   ALKPHOS 74 06/11/2022 1348   AST 67 (H) 06/11/2022 1348   ALT 31 06/11/2022 1348   BILITOT 0.6 06/11/2022 1348       RADIOGRAPHIC STUDIES: I have personally reviewed the radiological images as listed and agreed with the findings in the report. NM PET Image Initial (PI) Skull Base To Thigh  Result Date: 07/06/2022 CLINICAL DATA:  Initial treatment strategy for squamous cell carcinoma of the larynx. EXAM: NUCLEAR MEDICINE PET SKULL BASE TO THIGH TECHNIQUE: 7.5 mCi F-18 FDG was injected intravenously. Full-ring PET imaging was performed from the skull base to thigh after the radiotracer. CT data was obtained and used for attenuation  correction and anatomic localization. Fasting blood glucose: 92 mg/dl COMPARISON:  CT neck and chest 06/10/2022. FINDINGS: Mediastinal blood pool activity: SUV max 2.9 Liver activity: SUV max NA NECK: Asymmetric right-sided soft tissue thickening involving the right vocal cord, SUV max 14.7. No hypermetabolic lymph nodes. Incidental CT findings: None. CHEST: No abnormal hypermetabolism. Incidental CT findings: Tracheostomy. Coronary artery calcification. Heart is at the upper limits of normal in size to mildly enlarged. No pericardial or pleural effusion. Mild centrilobular emphysema. Vague patchy peribronchovascular ground-glass in the anterior segment right upper lobe is new in the interval and therefore infectious/inflammatory in etiology. ABDOMEN/PELVIS: No abnormal hypermetabolism. Incidental CT findings: Liver, gallbladder, adrenal glands unremarkable. Tiny right renal stones. Kidneys, spleen, pancreas, stomach and bowel are otherwise grossly unremarkable. Atherosclerotic calcification of the aorta. SKELETON: No abnormal osseous hypermetabolism. Incidental CT findings: Degenerative changes in the spine.  Old bilateral rib fractures. IMPRESSION: 1. Hypermetabolic asymmetric soft tissue thickening along the right vocal cord, compatible with the  provided history of laryngeal cancer. No evidence of metastatic disease. 2. Tiny right renal stones. 3. Aortic atherosclerosis (ICD10-I70.0). Coronary artery calcification. 4.  Emphysema (ICD10-J43.9). Electronically Signed   By: Lorin Picket M.D.   On: 07/06/2022 08:43   DG Swallowing Func-Speech Pathology  Result Date: 06/15/2022 Table formatting from the original result was not included. Images from the original result were not included. Objective Swallowing Evaluation: Type of Study: MBS-Modified Barium Swallow Study  Patient Details Name: Daniel Marshall MRN: [id number] Date of Birth: 1975-12-25 Today's Date: 06/15/2022 Time: SLP Start Time (ACUTE ONLY): 1212 -SLP Stop Time (ACUTE ONLY): 1225 SLP Time Calculation (min) (ACUTE ONLY): 13 min Past Medical History: Past Medical History: Diagnosis Date  Asthma   as a child  Broken ribs   Dyspnea   Hypertension  Past Surgical History: Past Surgical History: Procedure Laterality Date  DIRECT LARYNGOSCOPY N/A 06/13/2022  Procedure: DIRECT LARYNGOSCOPY WITH BIOPSY;  Surgeon: Jenetta Downer, MD;  Location: [place];  Service: ENT;  Laterality: N/A;  NO PAST SURGERIES    TRACHEOSTOMY TUBE PLACEMENT N/A 06/13/2022  Procedure: AWAKE TRACHEOSTOMY;  Surgeon: Jenetta Downer, MD;  Location: [place];  Service: ENT;  Laterality: N/A; HPI: Pt is a 46 yo male who presented to [facility] 11/15 with several days of cough and dyspnea. 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 Pt returned to [facility] ED on 11/16 and 11/18 pt underwent elective trach and laryngeal mass biopsies. CT showed asymmetric thickening of the right aryepiglottic fold and asymmetric enlargement of the right laryngeal ventricle and piriform sinus. This has progressed in the interval. 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 PMH includes: asthma, broken ribs, dyspnea, HTN  Subjective: alert and cooperative  Recommendations for follow up therapy are one  component of a multi-disciplinary discharge planning process, led by the attending physician.  Recommendations may be updated based on patient status, additional functional criteria and insurance authorization. Assessment / Plan / Recommendation   06/15/2022   3:00 PM Clinical Impressions Clinical Impression Pt has a mild pharyngeal dysphagia with reduced base of tongue retraction and epiglottic inversion. There is mild vallecular residue, noted most with more solid foods. Laryngeal vestibule closure is relatively intact considering location of mass. Penetration occurred with thin liquids before the swallow, but it was trace. What did not clear spontaneously was cleared easily with a cued throat clear. Recommend continuing with regular diet and thin liquids with use of PMV  for  any PO intake and use of intermittent throat clear. SLP Visit Diagnosis Dysphagia, unspecified (R13.10) Impact on safety and function Mild aspiration risk     06/15/2022   3:00 PM Treatment Recommendations Treatment Recommendations Therapy as outlined in treatment plan below     06/15/2022   3:00 PM Prognosis Prognosis for Safe Diet Advancement Good   06/15/2022   3:00 PM Diet Recommendations SLP Diet Recommendations Regular solids;Thin liquid Liquid Administration via Cup;Straw Medication Administration Whole meds with liquid Compensations Slow rate;Small sips/bites;Clear throat intermittently Postural Changes Seated upright at 90 degrees     06/15/2022   3:00 PM Other Recommendations Oral Care Recommendations Oral care BID Other Recommendations Place PMSV during PO intake Follow Up Recommendations Outpatient SLP Functional Status Assessment Patient has had a recent decline in their functional status and demonstrates the ability to make significant improvements in function in a reasonable and predictable amount of time.   06/15/2022   3:00 PM Frequency and Duration  Speech Therapy Frequency (ACUTE ONLY) min 2x/week Treatment Duration 2 weeks       No data to display       06/15/2022   3:00 PM Pharyngeal Phase Pharyngeal Phase Impaired Pharyngeal- Thin Teaspoon Reduced epiglottic inversion;Reduced anterior laryngeal mobility;Reduced tongue base retraction;Pharyngeal residue - valleculae Pharyngeal- Thin Cup Reduced epiglottic inversion;Reduced anterior laryngeal mobility;Reduced tongue base retraction;Pharyngeal residue - valleculae Pharyngeal- Thin Straw Reduced epiglottic inversion;Reduced anterior laryngeal mobility;Reduced tongue base retraction;Pharyngeal residue - valleculae;Penetration/Aspiration before swallow Pharyngeal Material enters airway, remains ABOVE vocal cords and not ejected out Pharyngeal- Puree Reduced anterior laryngeal mobility;Reduced tongue base retraction;Pharyngeal residue - valleculae Pharyngeal- Regular Reduced anterior laryngeal mobility;Reduced tongue base retraction;Pharyngeal residue - valleculae Pharyngeal- Pill Reduced anterior laryngeal mobility;Reduced tongue base retraction;Pharyngeal residue - valleculae    06/15/2022   3:00 PM Cervical Esophageal Phase  Cervical Esophageal Phase [facility] Osie Bond., M.A. [place] [facility] Office [phone] Secure chat preferred 06/15/2022, 3:17 PM                     CT CHEST W CONTRAST  Result Date: 06/14/2022 CLINICAL DATA:  Head neck cancer, for staging EXAM: CT CHEST WITH CONTRAST TECHNIQUE: Multidetector CT imaging of the chest was performed during intravenous contrast administration. RADIATION DOSE REDUCTION: This exam was performed according to the departmental dose-optimization program which includes automated exposure control, adjustment of the mA and/or kV according to patient size and/or use of iterative reconstruction technique. CONTRAST:  47m OMNIPAQUE IOHEXOL 350 MG/ML SOLN COMPARISON:  Chest radiograph dated 06/13/2022. CTA chest dated 01/06/2022. FINDINGS: Cardiovascular: Heart is normal in size.  No pericardial effusion. No evidence of thoracic  aortic aneurysm. Mediastinum/Nodes: No suspicious mediastinal lymphadenopathy. Retrosternal fluid/gas, likely reflecting postprocedural changes in the setting of recent tracheostomy. Visualized thyroid is unremarkable. Lungs/Pleura: Tracheostomy in satisfactory position. Mild centrilobular emphysematous changes, upper lung predominant. No focal consolidation. 3 mm nodule at the left lung apex (series 4/image 32), stable versus mildly improved. No new/suspicious pulmonary nodules. No pleural effusion or pneumothorax. Upper Abdomen: Visualized upper abdomen is grossly unremarkable. Musculoskeletal: Visualized osseous structures are within normal limits. IMPRESSION: Tracheostomy in satisfactory position. Associated postprocedural changes. No findings suspicious for metastatic disease in the chest. 3 mm nodule at the left lung apex, stable versus mildly improved, benign. No follow-up is recommended. Emphysema (ICD10-J43.9). Electronically Signed   By: SJulian HyM.D.   On: 06/14/2022 21:55    All questions were answered. The patient knows to call the clinic with any problems, questions or  concerns. I spent 6 minutes in the care of this patient including H and P, review of records, counseling and coordination of care.  I connected with  Gee L Bubeck on 07/14/22 by a telephone application and verified that I am speaking with the correct person using two identifiers.   I discussed the limitations of evaluation and management by telemedicine. The patient expressed understanding and agreed to proceed.      Benay Pike, MD 07/14/2022 1:45 PM

## 2022-07-14 NOTE — Progress Notes (Incomplete)
Radiation Oncology         [phone] ________________________________  Initial Outpatient Consultation  Name: Daniel Marshall MRN: [id number]  Date: 07/15/2022  DOB: July 22, 1976  CC:Pcp, No  Jenetta Downer, MD   REFERRING PHYSICIAN: Jenetta Downer, MD  DIAGNOSIS:    ICD-10-CM   1. 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 male who was diagnosed with COPD in 12/2021 after mild emphysematous changes noted on a chest CT performed in the ED on 01/06/22 (presented then with SOB, throat tightness, and hoarseness). He was then referred to ENT for a laryngoscopy due to an abnormal neck CT (performed in the ED on 01/06/22) which showed concern for a right vocal cord abnormality. However he was lost to follow up.   In more recent history, the patient presented to the ED on 06/10/22 with the cc of worsening SOB x 3 days, and an associated non-productive cough. On arrival to the ED, he was noted to have wheezing with poor air movement and he was placed on a BiPAP (soon after transitioned to a nasal cannula). He was admitted to [facility] and started on solumedrol, duonebs, and empiric ceftriaxone/ azithro.  He however developed worsening SOB and required BiPAP again, and was later able to transition back to room air. CT soft tissues was repeated which showed asymmetric thickening of the right aryepiglottic fold and asymmetric enlargement of the right laryngeal ventricle and piriform sinus, (progressed since prior imaging). ENT was later consulted and performed a bedside fiberoptic exam which revealed a right larynx mass with vocal cord paresis and a narrowed airway, however this exam was further limited due to gagging and coughing. The patient was strongly advised to transfer to [facility] and undergo biopsy and tracheostomy by ENT, however  the patient opted to leave AMA on the day of his admission.  The patient returned to the ED the following day (06/11/22) with ongoing SOB, and he agreed to admission and further intervention.   Direct laryngoscopy for biopsy of the right laryngeal mass on 06/13/22 while inpatient revealed invasive squamous cell carcinoma (keratinizing). He also underwent a tracheostomy at this time.   Chest CT with contrast performed while inpatient on 06/14/22 did not show any evidence of further metastatic disease in the chest, and stability of a 3 mm nodule at the left lung apex.  Following discharge, the patient was accordingly referred to Dr. Sabino Gasser (Otolaryngology) on 06/26/22. During this visit, the patient denied any issues maintaining trach. In terms of treatment moving forward, Dr. Sabino Gasser discussed the roles of definitive chemoradiation and laryngectomy pending further discussion with both medical and surgical oncology.   PET scan performed on 07/03/22 revealing hypermetabolic asymmetric soft tissue thickening along the right vocal cord, compatible with the patient's diagnosis of laryngeal cancer.    Following discussion with Dr. Chryl Heck on 07/06/46, and based on PET findings which show no evidence of metastatic disease, the patient is open to considering surgical resectioning (pending further discussion with ENT). If he is deemed to not be a good surgical candidate, Dr. Chryl Heck may consider him for concurrent chemoradiation.   Pertinent imaging thus far includes a CT of the neck and thyroid performed on 07/07/22 at [facility] which showed abnormal soft tissue appearing to involve the right aryepiglottic fold, circumferential involvement about the false cords,  and inferiorly about the right true vocal cord where it appears inseparable from the adjacent arytenoid cartilage. Findings are overall worrisome for laryngeal neoplasm with transglottic extension. However, evaluation for extralaryngeal neoplasm was  substantially limited on this study given motion artifact. CT otherwise showed no gross nodular soft tissue external to the cartilage suggestive of definitive extra laryngeal extension, progressive lymphadenopathy, or pathologically enlarged nodes within the neck.   The patient was able to meet with Dr. Chryl Heck yesterday ***.  Swallowing issues, if any: throat tightness and hoarseness  - S/p tracheostomy on 06/13/22 (trach changed on 07/13/22 with Dr. Sabino Gasser)  - Barium swallow study on 06/15/22 showed mild pharyngeal dysphagia   Weight Changes: none  Pain status: ***  Other symptoms: hoarseness x 1 year, SOB with exertion and at rest, non-productive cough   Tobacco history, if any: smoked half a pack for over 20 years  ETOH abuse, if any: reports current/recent alcohol use  Prior cancers, if any: none  PREVIOUS RADIATION THERAPY: No  PAST MEDICAL HISTORY:  has a past medical history of Asthma, Broken ribs, Dyspnea, and Hypertension.    PAST SURGICAL HISTORY: Past Surgical History:  Procedure Laterality Date   DIRECT LARYNGOSCOPY N/A 06/13/2022   Procedure: DIRECT LARYNGOSCOPY WITH BIOPSY;  Surgeon: Jenetta Downer, MD;  Location: [place];  Service: ENT;  Laterality: N/A;   NO PAST SURGERIES     TRACHEOSTOMY TUBE PLACEMENT N/A 06/13/2022   Procedure: AWAKE TRACHEOSTOMY;  Surgeon: Jenetta Downer, MD;  Location: [place];  Service: ENT;  Laterality: N/A;    FAMILY HISTORY: Family history is unknown by patient.  SOCIAL HISTORY:  reports that he has been smoking cigarettes. He has never used smokeless tobacco. He reports current alcohol use. He reports current drug use. Drug: Marijuana.  ALLERGIES: Patient has no known allergies.  MEDICATIONS:  Current Outpatient Medications  Medication Sig Dispense Refill   ibuprofen (ADVIL) 200 MG tablet Take 200-600 mg by mouth every 6 (six) hours as needed for fever or headache (pain).     losartan (COZAAR) 50 MG tablet Take 1 tablet (50 mg total)  by mouth daily. 30 tablet 0   No current facility-administered medications for this encounter.    REVIEW OF SYSTEMS:  Notable for that above.   PHYSICAL EXAM:  vitals were not taken for this visit.   General: Alert and oriented, in no acute distress HEENT: Head is normocephalic. Extraocular movements are intact. Oropharynx is notable for ***. Neck: Neck is notable for *** Heart: Regular in rate and rhythm with no murmurs, rubs, or gallops. Chest: Clear to auscultation bilaterally, with no rhonchi, wheezes, or rales. Abdomen: Soft, nontender, nondistended, with no rigidity or guarding. Extremities: No cyanosis or edema. Lymphatics: see Neck Exam Skin: No concerning lesions. Musculoskeletal: symmetric strength and muscle tone throughout. Neurologic: Cranial nerves II through XII are grossly intact. No obvious focalities. Speech is fluent. Coordination is intact. Psychiatric: Judgment and insight are intact. Affect is appropriate.   ECOG = ***  0 - Asymptomatic (Fully active, able to carry on all predisease activities without restriction)  1 - Symptomatic but completely ambulatory (Restricted in physically strenuous activity but ambulatory and able to carry out work of a light or sedentary nature. For example, light housework, office work)  2 - Symptomatic, <50% in bed during the day (Ambulatory and capable of all self care but unable to carry out any work activities. Up and about more than 50% of waking hours)  3 - Symptomatic, >50% in  bed, but not bedbound (Capable of only limited self-care, confined to bed or chair 50% or more of waking hours)  4 - Bedbound (Completely disabled. Cannot carry on any self-care. Totally confined to bed or chair)  5 - Death   Eustace Pen MM, Creech RH, Tormey DC, et al. [phone]). "Toxicity and response criteria of the [facility]". [place] Oncol. 5 (6): 649-55   LABORATORY DATA:  Lab Results  Component Value Date   WBC 9.3  06/19/2022   HGB 13.4 06/19/2022   HCT 38.1 (L) 06/19/2022   MCV 85.6 06/19/2022   PLT 317 06/19/2022   CMP     Component Value Date/Time   NA 132 (L) 06/19/2022 0641   NA 135 11/25/2017 1621   K 4.2 06/19/2022 0641   CL 95 (L) 06/19/2022 0641   CO2 25 06/19/2022 0641   GLUCOSE 99 06/19/2022 0641   BUN 6 06/19/2022 0641   BUN 15 11/25/2017 1621   CREATININE 0.85 06/19/2022 0641   CREATININE 0.93 05/05/2017 1445   CALCIUM 9.1 06/19/2022 0641   PROT 7.8 06/11/2022 1348   ALBUMIN 3.5 06/11/2022 1348   AST 67 (H) 06/11/2022 1348   ALT 31 06/11/2022 1348   ALKPHOS 74 06/11/2022 1348   BILITOT 0.6 06/11/2022 1348   GFRNONAA >60 06/19/2022 0641   GFRNONAA 102 05/05/2017 1445   GFRAA >60 01/18/2018 0442   GFRAA 118 05/05/2017 1445      Lab Results  Component Value Date   TSH 1.87 05/05/2017     RADIOGRAPHY: NM PET Image Initial (PI) Skull Base To Thigh  Result Date: 07/06/2022 CLINICAL DATA:  Initial treatment strategy for squamous cell carcinoma of the larynx. EXAM: NUCLEAR MEDICINE PET SKULL BASE TO THIGH TECHNIQUE: 7.5 mCi F-18 FDG was injected intravenously. Full-ring PET imaging was performed from the skull base to thigh after the radiotracer. CT data was obtained and used for attenuation correction and anatomic localization. Fasting blood glucose: 92 mg/dl COMPARISON:  CT neck and chest 06/10/2022. FINDINGS: Mediastinal blood pool activity: SUV max 2.9 Liver activity: SUV max NA NECK: Asymmetric right-sided soft tissue thickening involving the right vocal cord, SUV max 14.7. No hypermetabolic lymph nodes. Incidental CT findings: None. CHEST: No abnormal hypermetabolism. Incidental CT findings: Tracheostomy. Coronary artery calcification. Heart is at the upper limits of normal in size to mildly enlarged. No pericardial or pleural effusion. Mild centrilobular emphysema. Vague patchy peribronchovascular ground-glass in the anterior segment right upper lobe is new in the interval  and therefore infectious/inflammatory in etiology. ABDOMEN/PELVIS: No abnormal hypermetabolism. Incidental CT findings: Liver, gallbladder, adrenal glands unremarkable. Tiny right renal stones. Kidneys, spleen, pancreas, stomach and bowel are otherwise grossly unremarkable. Atherosclerotic calcification of the aorta. SKELETON: No abnormal osseous hypermetabolism. Incidental CT findings: Degenerative changes in the spine.  Old bilateral rib fractures. IMPRESSION: 1. Hypermetabolic asymmetric soft tissue thickening along the right vocal cord, compatible with the provided history of laryngeal cancer. No evidence of metastatic disease. 2. Tiny right renal stones. 3. Aortic atherosclerosis (ICD10-I70.0). Coronary artery calcification. 4.  Emphysema (ICD10-J43.9). Electronically Signed   By: Lorin Picket M.D.   On: 07/06/2022 08:43   DG Swallowing Func-Speech Pathology  Result Date: 06/15/2022 Table formatting from the original result was not included. Images from the original result were not included. Objective Swallowing Evaluation: Type of Study: MBS-Modified Barium Swallow Study  Patient Details Name: TASEEN MARASIGAN MRN: [id number] Date of Birth: November 01, 1975 Today's Date: 06/15/2022 Time: SLP Start Time (ACUTE ONLY): 1212 -  SLP Stop Time (ACUTE ONLY): 1225 SLP Time Calculation (min) (ACUTE ONLY): 13 min Past Medical History: Past Medical History: Diagnosis Date  Asthma   as a child  Broken ribs   Dyspnea   Hypertension  Past Surgical History: Past Surgical History: Procedure Laterality Date  DIRECT LARYNGOSCOPY N/A 06/13/2022  Procedure: DIRECT LARYNGOSCOPY WITH BIOPSY;  Surgeon: Jenetta Downer, MD;  Location: [place];  Service: ENT;  Laterality: N/A;  NO PAST SURGERIES    TRACHEOSTOMY TUBE PLACEMENT N/A 06/13/2022  Procedure: AWAKE TRACHEOSTOMY;  Surgeon: Jenetta Downer, MD;  Location: [place];  Service: ENT;  Laterality: N/A; HPI: Pt is a 46 yo male who presented to [facility] 11/15 with several days of cough and dyspnea.  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 Pt returned to [facility] ED on 11/16 and 11/18 pt underwent elective trach and laryngeal mass biopsies. CT showed asymmetric thickening of the right aryepiglottic fold and asymmetric enlargement of the right laryngeal ventricle and piriform sinus. This has progressed in the interval. 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 PMH includes: asthma, broken ribs, dyspnea, HTN  Subjective: alert and cooperative  Recommendations for follow up therapy are one component of a multi-disciplinary discharge planning process, led by the attending physician.  Recommendations may be updated based on patient status, additional functional criteria and insurance authorization. Assessment / Plan / Recommendation   06/15/2022   3:00 PM Clinical Impressions Clinical Impression Pt has a mild pharyngeal dysphagia with reduced base of tongue retraction and epiglottic inversion. There is mild vallecular residue, noted most with more solid foods. Laryngeal vestibule closure is relatively intact considering location of mass. Penetration occurred with thin liquids before the swallow, but it was trace. What did not clear spontaneously was cleared easily with a cued throat clear. Recommend continuing with regular diet and thin liquids with use of PMV  for any PO intake and use of intermittent throat clear. SLP Visit Diagnosis Dysphagia, unspecified (R13.10) Impact on safety and function Mild aspiration risk     06/15/2022   3:00 PM Treatment Recommendations Treatment Recommendations Therapy as outlined in treatment plan below     06/15/2022   3:00 PM Prognosis Prognosis for Safe Diet Advancement Good   06/15/2022   3:00 PM Diet Recommendations SLP Diet Recommendations Regular solids;Thin liquid Liquid Administration via Cup;Straw Medication Administration Whole meds with liquid Compensations Slow rate;Small sips/bites;Clear throat  intermittently Postural Changes Seated upright at 90 degrees     06/15/2022   3:00 PM Other Recommendations Oral Care Recommendations Oral care BID Other Recommendations Place PMSV during PO intake Follow Up Recommendations Outpatient SLP Functional Status Assessment Patient has had a recent decline in their functional status and demonstrates the ability to make significant improvements in function in a reasonable and predictable amount of time.   06/15/2022   3:00 PM Frequency and Duration  Speech Therapy Frequency (ACUTE ONLY) min 2x/week Treatment Duration 2 weeks      No data to display       06/15/2022   3:00 PM Pharyngeal Phase Pharyngeal Phase Impaired Pharyngeal- Thin Teaspoon Reduced epiglottic inversion;Reduced anterior laryngeal mobility;Reduced tongue base retraction;Pharyngeal residue - valleculae Pharyngeal- Thin Cup Reduced epiglottic inversion;Reduced anterior laryngeal mobility;Reduced tongue base retraction;Pharyngeal residue - valleculae Pharyngeal- Thin Straw Reduced epiglottic inversion;Reduced anterior laryngeal mobility;Reduced tongue base retraction;Pharyngeal residue - valleculae;Penetration/Aspiration before swallow Pharyngeal Material enters airway, remains ABOVE vocal cords and not ejected out Pharyngeal- Puree Reduced anterior laryngeal  mobility;Reduced tongue base retraction;Pharyngeal residue - valleculae Pharyngeal- Regular Reduced anterior laryngeal mobility;Reduced tongue base retraction;Pharyngeal residue - valleculae Pharyngeal- Pill Reduced anterior laryngeal mobility;Reduced tongue base retraction;Pharyngeal residue - valleculae    06/15/2022   3:00 PM Cervical Esophageal Phase  Cervical Esophageal Phase [facility] Osie Bond., M.A. [place] [facility] Office [phone] Secure chat preferred 06/15/2022, 3:17 PM                     CT CHEST W CONTRAST  Result Date: 06/14/2022 CLINICAL DATA:  Head neck cancer, for staging EXAM: CT CHEST WITH CONTRAST TECHNIQUE:  Multidetector CT imaging of the chest was performed during intravenous contrast administration. RADIATION DOSE REDUCTION: This exam was performed according to the departmental dose-optimization program which includes automated exposure control, adjustment of the mA and/or kV according to patient size and/or use of iterative reconstruction technique. CONTRAST:  23m OMNIPAQUE IOHEXOL 350 MG/ML SOLN COMPARISON:  Chest radiograph dated 06/13/2022. CTA chest dated 01/06/2022. FINDINGS: Cardiovascular: Heart is normal in size.  No pericardial effusion. No evidence of thoracic aortic aneurysm. Mediastinum/Nodes: No suspicious mediastinal lymphadenopathy. Retrosternal fluid/gas, likely reflecting postprocedural changes in the setting of recent tracheostomy. Visualized thyroid is unremarkable. Lungs/Pleura: Tracheostomy in satisfactory position. Mild centrilobular emphysematous changes, upper lung predominant. No focal consolidation. 3 mm nodule at the left lung apex (series 4/image 32), stable versus mildly improved. No new/suspicious pulmonary nodules. No pleural effusion or pneumothorax. Upper Abdomen: Visualized upper abdomen is grossly unremarkable. Musculoskeletal: Visualized osseous structures are within normal limits. IMPRESSION: Tracheostomy in satisfactory position. Associated postprocedural changes. No findings suspicious for metastatic disease in the chest. 3 mm nodule at the left lung apex, stable versus mildly improved, benign. No follow-up is recommended. Emphysema (ICD10-J43.9). Electronically Signed   By: SJulian HyM.D.   On: 06/14/2022 21:55      IMPRESSION/PLAN:  This is a delightful patient with head and neck cancer. I *** recommend radiotherapy for this patient.  We discussed the potential risks, benefits, and side effects of radiotherapy. We talked in detail about acute and late effects. We discussed that some of the most bothersome acute effects may be mucositis, dysgeusia, salivary  changes, skin irritation, hair loss, dehydration, weight loss and fatigue. We talked about late effects which include but are not necessarily limited to dysphagia, hypothyroidism, nerve injury, vascular injury, spinal cord injury, xerostomia, trismus, neck edema, and potential injury to any of the tissues in the head and neck region. No guarantees of treatment were given. A consent form was signed and placed in the patient's medical record. The patient is enthusiastic about proceeding with treatment. I look forward to participating in the patient's care.    Simulation (treatment planning) will take place ***  We also discussed that the treatment of head and neck cancer is a multidisciplinary process to maximize treatment outcomes and quality of life. For this reason the following referrals have been or will be made:  *** Medical oncology to discuss chemotherapy   *** Dentistry for dental evaluation, possible extractions in the radiation fields, and /or advice on reducing risk of cavities, osteoradionecrosis, or other oral issues.  *** Nutritionist for nutrition support during and after treatment.  *** Speech language pathology for swallowing and/or speech therapy.  *** Social work for social support.   *** Physical therapy due to risk of lymphedema in neck and deconditioning.  *** Baseline labs including TSH.  On date of service, in total, I spent *** minutes on this encounter. Patient was  seen in person.  __________________________________________   Eppie Gibson, MD  This document serves as a record of services personally performed by Eppie Gibson, MD. It was created on her behalf by Roney Mans, a trained medical scribe. The creation of this record is based on the scribe's personal observations and the provider's statements to them. This document has been checked and approved by the attending provider.

## 2022-07-15 ENCOUNTER — Ambulatory Visit
Admission: RE | Admit: 2022-07-15 | Discharge: 2022-07-15 | Disposition: A | Discharge status: Home / Self Care | Payer: Medicaid Other | Source: Ambulatory Visit | Attending: Radiation Oncology | Admitting: Radiation Oncology

## 2022-07-15 ENCOUNTER — Ambulatory Visit: Admission: RE | Admit: 2022-07-15 | Payer: Medicaid Other | Source: Ambulatory Visit

## 2022-07-15 ENCOUNTER — Telehealth: Payer: Self-pay

## 2022-07-15 DIAGNOSIS — C329 Malignant neoplasm of larynx, unspecified: Secondary | ICD-10-CM

## 2022-07-15 NOTE — Telephone Encounter (Signed)
RN Anderson Malta called pt when he did not show up for his appointment for nurse eval. Rn got no answer and voicemail was left for pt. Rn will attempt to call back later as well.

## 2022-07-15 NOTE — Telephone Encounter (Signed)
Geoffry Paradise Rn called pt and left second voicemail for pt who was a no call/ no show for his nurse eval and consult.

## 2022-07-16 ENCOUNTER — Inpatient Hospital Stay: Payer: Medicaid Other | Admitting: Licensed Clinical Social Worker

## 2022-07-16 ENCOUNTER — Telehealth: Payer: Self-pay | Admitting: Radiation Oncology

## 2022-07-16 NOTE — Progress Notes (Signed)
[place] Work  Initial Assessment   Daniel Marshall is a 46 y.o. year old male contacted by phone. Clinical Social Work was referred by new patient protocol for assessment of psychosocial needs.   SDOH (Social Determinants of Health) assessments performed: Yes SDOH Interventions    Flowsheet Row Clinical Support from 07/16/2022 in [facility]  SDOH Interventions   Food Insecurity Interventions Other (Comment)  [food pantry information]  Transportation Interventions Payor Benefit       SDOH Screenings   Food Insecurity: Food Insecurity Present (07/16/2022)  Housing: Low Risk  (06/14/2022)  Transportation Needs: Unmet Transportation Needs (07/16/2022)  Utilities: Not At Risk (06/14/2022)  Depression (PHQ2-9): Low Risk  (05/21/2019)  Tobacco Use: High Risk (07/14/2022)     Distress Screen completed: No     No data to display            Family/Social Information:  Housing Arrangement: patient lives with his significant other Family members/support persons in your life? Family Transportation concerns: yes, has been paying people for rides  Employment: Limited work recently .  Income source: Employment Financial concerns: Yes, due to illness and/or loss of work during treatment Type of concern: Archivist access concerns: yes Religious or spiritual practice: Not known Services Currently in place:  [employer] Medicaid  Coping/ Adjustment to diagnosis: Patient understands treatment plan and what happens next? yes, he has chosen to pursue surgery for treatment. CSW sent message to [facility] team to notify of pt's decision    SUMMARY: Current SDOH Barriers:  Transportation and Limited access to food  Clinical Social Work Clinical Goal(s):  Explore community resource options for unmet needs related to:  Chief Executive Officer Insecurity   Interventions: Discussed common feeling and emotions when being diagnosed  with cancer, and the importance of support during treatment Informed patient of the support team roles and support services at [facility] Provided [place] contact information and encouraged patient to call with any questions or concerns Provided information on local food pantries  and Medicaid transportation. Pt confirmed he understands how to access transportation   Follow Up Plan: Patient will contact CSW with any support or resource needs Patient verbalizes understanding of plan: Yes    Greycen Felter E Matasha Smigelski, LCSW   Patient is participating in a Managed Medicaid Plan:  Yes

## 2022-07-16 NOTE — Telephone Encounter (Signed)
12/21 '@8'$ :26 am Left voicemail for patient to call our office to be reschedule for his missed appointment on yesterday.

## 2022-07-17 ENCOUNTER — Telehealth: Payer: Self-pay | Admitting: Radiation Oncology

## 2022-07-17 NOTE — Telephone Encounter (Signed)
12/22 @ 8:42 am Left voicemail for patient to call our office to be reschedule for his missed appointment on 12/20, with Dr. Isidore Moos.

## 2022-07-27 HISTORY — PX: LARYNGECTOMY: SUR815

## 2022-08-07 DIAGNOSIS — R1310 Dysphagia, unspecified: Secondary | ICD-10-CM | POA: Diagnosis not present

## 2022-08-07 DIAGNOSIS — Z9981 Dependence on supplemental oxygen: Secondary | ICD-10-CM | POA: Diagnosis not present

## 2022-08-07 DIAGNOSIS — D62 Acute posthemorrhagic anemia: Secondary | ICD-10-CM | POA: Diagnosis not present

## 2022-08-07 DIAGNOSIS — C329 Malignant neoplasm of larynx, unspecified: Secondary | ICD-10-CM | POA: Diagnosis not present

## 2022-08-07 DIAGNOSIS — I10 Essential (primary) hypertension: Secondary | ICD-10-CM | POA: Diagnosis not present

## 2022-08-07 DIAGNOSIS — Z930 Tracheostomy status: Secondary | ICD-10-CM | POA: Diagnosis not present

## 2022-08-07 DIAGNOSIS — F17200 Nicotine dependence, unspecified, uncomplicated: Secondary | ICD-10-CM | POA: Diagnosis not present

## 2022-08-07 DIAGNOSIS — D630 Anemia in neoplastic disease: Secondary | ICD-10-CM | POA: Diagnosis not present

## 2022-08-07 DIAGNOSIS — C320 Malignant neoplasm of glottis: Secondary | ICD-10-CM | POA: Diagnosis not present

## 2022-08-07 DIAGNOSIS — J984 Other disorders of lung: Secondary | ICD-10-CM | POA: Diagnosis not present

## 2022-08-07 DIAGNOSIS — Z4659 Encounter for fitting and adjustment of other gastrointestinal appliance and device: Secondary | ICD-10-CM | POA: Diagnosis not present

## 2022-08-07 DIAGNOSIS — Z9002 Acquired absence of larynx: Secondary | ICD-10-CM | POA: Diagnosis not present

## 2022-08-08 DIAGNOSIS — R0689 Other abnormalities of breathing: Secondary | ICD-10-CM | POA: Diagnosis not present

## 2022-08-08 DIAGNOSIS — I10 Essential (primary) hypertension: Secondary | ICD-10-CM | POA: Diagnosis not present

## 2022-08-08 DIAGNOSIS — Z9002 Acquired absence of larynx: Secondary | ICD-10-CM | POA: Diagnosis not present

## 2022-08-08 DIAGNOSIS — C329 Malignant neoplasm of larynx, unspecified: Secondary | ICD-10-CM | POA: Diagnosis not present

## 2022-08-08 DIAGNOSIS — R1310 Dysphagia, unspecified: Secondary | ICD-10-CM | POA: Diagnosis not present

## 2022-08-08 DIAGNOSIS — D62 Acute posthemorrhagic anemia: Secondary | ICD-10-CM | POA: Diagnosis not present

## 2022-08-09 DIAGNOSIS — C329 Malignant neoplasm of larynx, unspecified: Secondary | ICD-10-CM | POA: Diagnosis not present

## 2022-08-09 DIAGNOSIS — D62 Acute posthemorrhagic anemia: Secondary | ICD-10-CM | POA: Diagnosis not present

## 2022-08-09 DIAGNOSIS — R0689 Other abnormalities of breathing: Secondary | ICD-10-CM | POA: Diagnosis not present

## 2022-08-09 DIAGNOSIS — I10 Essential (primary) hypertension: Secondary | ICD-10-CM | POA: Diagnosis not present

## 2022-08-09 DIAGNOSIS — Z9002 Acquired absence of larynx: Secondary | ICD-10-CM | POA: Diagnosis not present

## 2022-08-09 DIAGNOSIS — R1310 Dysphagia, unspecified: Secondary | ICD-10-CM | POA: Diagnosis not present

## 2022-08-17 DIAGNOSIS — C329 Malignant neoplasm of larynx, unspecified: Secondary | ICD-10-CM | POA: Diagnosis not present

## 2022-08-18 ENCOUNTER — Encounter: Payer: Self-pay | Admitting: Nurse Practitioner

## 2022-08-18 ENCOUNTER — Ambulatory Visit: Payer: Medicaid Other | Admitting: Nurse Practitioner

## 2022-08-18 VITALS — BP 119/55 | HR 112 | Ht 65.75 in | Wt 161.4 lb

## 2022-08-18 DIAGNOSIS — Z131 Encounter for screening for diabetes mellitus: Secondary | ICD-10-CM | POA: Insufficient documentation

## 2022-08-18 DIAGNOSIS — Z930 Tracheostomy status: Secondary | ICD-10-CM | POA: Diagnosis not present

## 2022-08-18 DIAGNOSIS — Z09 Encounter for follow-up examination after completed treatment for conditions other than malignant neoplasm: Secondary | ICD-10-CM | POA: Insufficient documentation

## 2022-08-18 DIAGNOSIS — J449 Chronic obstructive pulmonary disease, unspecified: Secondary | ICD-10-CM | POA: Insufficient documentation

## 2022-08-18 DIAGNOSIS — I10 Essential (primary) hypertension: Secondary | ICD-10-CM | POA: Insufficient documentation

## 2022-08-18 DIAGNOSIS — C329 Malignant neoplasm of larynx, unspecified: Secondary | ICD-10-CM | POA: Insufficient documentation

## 2022-08-18 LAB — POCT GLYCOSYLATED HEMOGLOBIN (HGB A1C): Hemoglobin A1C: 5.3 % (ref 4.0–5.6)

## 2022-08-18 NOTE — Progress Notes (Addendum)
New Patient Office Visit  Subjective:  Patient ID: Daniel Marshall, male    DOB: 1975/09/13  Age: 47 y.o. 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  He is accompanied by Lemmie Evens, his Family member.  He was recently admitted at [facility] for 08/07/2022 to 08/14/2022 for for a total laryngectomy and selective neck dissection with Dobbhoff tube placement.  He is currently n.p.o. has esophagram planned for 1/24.  He is on Osmolite 1.5 at 240 mill 6 times daily to provide nutrition.  Free water 90 minutes before and after each bolus.   He is established with oncology, he may need adjuvant chemotherapy after surgery per oncology, he was encouraged to call oncology office and schedule an appointment with them.  He Is established with ENT Dr Jenetta Downer .  Today patient denies fever, chills, malaise, chest pain, shortness of breath.   Has upcoming appointment with surgeon tomorrow.   Due for flu vaccine, colon cancer screening .  Patient declined flu vaccine today he would like to hold off on colon cancer screening referral at this time.    COPD . former cigarette smoker Smoked 1 pack of cigarettes daily for 15 to 16 years.  Quit in November 2023.  He currently denies shortness of breath, wheezing.   Past Medical History:  Diagnosis Date   Asthma     as a child   Broken ribs    Cancer ([place])    Dyspnea    Hypertension     Past Surgical History:  Procedure Laterality Date   DIRECT LARYNGOSCOPY N/A 06/13/2022   Procedure: DIRECT LARYNGOSCOPY WITH BIOPSY;  Surgeon: Jenetta Downer, MD;  Location[facility] OR;  Service: ENT;  Laterality: N/A;   LARYNGECTOMY  2024   NO PAST SURGERIES     TRACHEOSTOMY TUBE PLACEMENT N/A 06/13/2022   Procedure: AWAKE TRACHEOSTOMY;  Surgeon: Jenetta Downer, MD;  Location: [facility] OR;  Service: ENT;  Laterality: N/A;    Family History  Problem Relation Age of Onset   Diabetes Mother    High blood pressure Mother    Bone cancer Maternal Grandfather    Lung cancer Neg Hx    Prostate cancer Neg Hx     Social History   Socioeconomic History   Marital status: Single    Spouse name: Not on file   Number of children: 1   Years of education: Not on file   Highest education level: Not on file  Occupational History   Not on file  Tobacco Use   Smoking status: Former    Types: Cigarettes   Smokeless tobacco: Never   Tobacco comments:    Quit 05/2022 smoked for 15-16 smoked one pack per day   Substance and Sexual Activity   Alcohol use: Not Currently    Comment: socially   Drug use: Not Currently  Types: Marijuana   Sexual activity: Yes  Other Topics Concern   Not on file  Social History Narrative   Lives with his cousin right now   Social Determinants of Health   Financial Resource Strain: Not on file  Food Insecurity: Food Insecurity Present (07/16/2022)   Hunger Vital Sign    Worried About Running Out of Food in the Last Year: Sometimes true    Ran Out of Food in the Last Year: Never true  Transportation Needs: Unmet Transportation Needs (07/16/2022)   PRAPARE - Hydrologist (Medical): Yes    Lack of Transportation (Non-Medical): No  Physical Activity: Not on file  Stress: Not on file  Social Connections: Not on file  Intimate Partner Violence: Not At Risk  (06/14/2022)   Humiliation, Afraid, Rape, and Kick questionnaire    Fear of Current or Ex-Partner: No    Emotionally Abused: No    Physically Abused: No    Sexually Abused: No    ROS Review of Systems  Constitutional:  Negative for activity change, appetite change, chills, diaphoresis, fatigue and fever.  Respiratory:  Negative for apnea, cough, choking, chest tightness, shortness of breath, wheezing and stridor.   Cardiovascular:  Negative for chest pain, palpitations and leg swelling.  Gastrointestinal:  Negative for abdominal distention, abdominal pain and anal bleeding.  Genitourinary: Negative.  Negative for difficulty urinating, dysuria and enuresis.  Neurological: Negative.  Negative for dizziness, seizures, facial asymmetry, light-headedness, numbness and headaches.  Psychiatric/Behavioral: Negative.  Negative for agitation, behavioral problems, confusion and decreased concentration.     Objective:   Today's Vitals: BP (!) 119/55   Pulse (!) 112   Ht 5' 5.75" (1.67 m)   Wt 161 lb 6.4 oz (73.2 kg)   SpO2 98%   BMI 26.25 kg/m   Physical Exam Constitutional:      General: He is not in acute distress.    Appearance: Normal appearance. He is not ill-appearing, toxic-appearing or diaphoretic.  Eyes:     General: No scleral icterus.       Right eye: No discharge.        Left eye: No discharge.  Neck:     Comments: Surgical incision well approximated with staples.  No redness, drainage noted.  Cardiovascular:     Rate and Rhythm: Normal rate and regular rhythm.     Pulses: Normal pulses.     Heart sounds: Normal heart sounds. No murmur heard.    No friction rub. No gallop.  Pulmonary:     Effort: No respiratory distress.     Breath sounds: No stridor. No wheezing, rhonchi or rales.     Comments: Tracheostomy capped, dressing clean and dry.  Chest:     Chest wall: No tenderness.  Abdominal:     General: There is no distension.     Palpations: Abdomen is soft.      Tenderness: There is no abdominal tenderness.  Musculoskeletal:        General: No swelling, tenderness, deformity or signs of injury.     Right lower leg: No edema.     Left lower leg: No edema.  Skin:    General: Skin is warm and dry.     Capillary Refill: Capillary refill takes less than 2 seconds.  Neurological:     Mental Status: He is alert.  Psychiatric:        Mood and Affect: Mood normal.        Behavior: Behavior  normal.        Thought Content: Thought content normal.        Judgment: Judgment normal.     Assessment & Plan:   Problem List Items Addressed This Visit       Cardiovascular and Mediastinum   High blood pressure    BP Readings from Last 3 Encounters:  08/18/22 (!) 119/55  07/06/22 (!) 147/83  06/19/22 122/84  Currently on losartan 50 mg daily.  Stated that he has not taken the medication for the past 4 days.  Patient was encouraged to take the medication as ordered, monitor blood pressure at home and report hypotension, ,blood pressure goal is less than 140/90.         Respiratory   Malignant neoplasm of larynx [facility]) - Primary    He was recently admitted at [facility] for 08/07/2022 to 08/14/2018 for for a total laryngectomy and selective neck dissection with Dobbhoff tube placement.  He is currently n.p.o. has esophagram planned for 124.  He is on Osmolite 1.5 at 240 mill 6 times daily to provide nutrition.  Free water 90 minutes before and after each bolus.  Has upcoming appointment with surgeon tomorrow, maintain close follow-up with ENT and oncology Take Celebrex 200 mg twice daily and oxycodone 5 mg every 4 hours as needed for pain      COPD (chronic obstructive pulmonary disease) ([place])    former cigarette smoker Smoked 1 pack of cigarettes daily for 15 to 16 years.  Quit in November 2023.  He currently denies shortness of breath, wheezing        Other   Hospital discharge follow-up    He was recently admitted at [facility] for 08/07/2022 to 08/14/2018 for for a total laryngectomy and selective neck dissection with Dobbhoff tube placement.  He is currently n.p.o. has esophagram planned for 124.  He is on Osmolite 1.5 at 240 mill 6 times daily to provide nutrition.  Free water 90 minutes before and after each bolus.   He is currently established with oncology, he may need adjuvant chemotherapy after surgery per oncology, he was encouraged to call oncology office and schedule an appointment with them.  Is established with ENT Dr Jenetta Downer .  Today patient denies fever, chills, malaise, chest pain, shortness of breath.   Has upcoming appointment with surgeon tomorrow.  Hospital discharge summary, labs imaging studies were reviewed by me today. He was encouraged to maintain close follow-up with his ENT and oncologist      Tracheostomy dependence [facility])    S/p laryngectomy for malignant neoplasm of larynx.  Site clean and dry no signs of infection noted.      Screening for diabetes mellitus    Lab Results  Component Value Date   HGBA1C 5.3 08/18/2022         Relevant Orders   POCT glycosylated hemoglobin (Hb A1C) (Completed)    Outpatient Encounter Medications as of 08/18/2022  Medication Sig   [EXPIRED] acetaminophen (TYLENOL) 500 MG tablet Take 1,000 mg by mouth every 6 (six) hours as needed.   [EXPIRED] celecoxib (CELEBREX) 200 MG capsule Take 200 mg by mouth 2 (two) times daily.   chlorhexidine (PERIDEX) 0.12 % solution Use as directed 15 mLs in the mouth or throat 2 (two) times daily.   [EXPIRED] famotidine (PEPCID) 20 MG tablet Take 20 mg by mouth daily.   losartan (COZAAR) 50 MG tablet Take 1 tablet (50 mg  total) by mouth daily.   Nutritional Supplements (FEEDING SUPPLEMENT, OSMOLITE 1.5 CAL,) LIQD Place 1,000 mLs into feeding tube continuous.   [EXPIRED] oxyCODONE (OXY IR/ROXICODONE) 5 MG immediate release tablet Take 5 mg by mouth every 4 (four) hours as needed for  moderate pain.   pantoprazole (PROTONIX) 20 MG tablet Take 20 mg by mouth daily.   [EXPIRED] sucralfate (CARAFATE) 1 g tablet Take 1 g by mouth 4 (four) times daily -  with meals and at bedtime.   Water For Irrigation, Sterile (FREE WATER) SOLN Place into feeding tube.   ibuprofen (ADVIL) 200 MG tablet Take 200-600 mg by mouth every 6 (six) hours as needed for fever or headache (pain). (Patient not taking: Reported on 08/18/2022)   No facility-administered encounter medications on file as of 08/18/2022.    Follow-up: Return in about 3 months (around 11/17/2022) for HTN/CPE.   Renee Rival, FNP

## 2022-08-18 NOTE — Assessment & Plan Note (Signed)
BP Readings from Last 3 Encounters:  08/18/22 (!) 119/55  07/06/22 (!) 147/83  06/19/22 122/84  Currently on losartan 50 mg daily.  Stated that he has not taken the medication for the past 4 days.  Patient was encouraged to take the medication as ordered, monitor blood pressure at home and report hypotension, ,blood pressure goal is less than 140/90.

## 2022-08-18 NOTE — Assessment & Plan Note (Addendum)
He was recently admitted at [facility] for 08/07/2022 to 08/14/2018 for for a total laryngectomy and selective neck dissection with Dobbhoff tube placement.  He is currently n.p.o. has esophagram planned for 124.  He is on Osmolite 1.5 at 240 mill 6 times daily to provide nutrition.  Free water 90 minutes before and after each bolus.   He is currently established with oncology, he may need adjuvant chemotherapy after surgery per oncology, he was encouraged to call oncology office and schedule an appointment with them.  Is established with ENT Dr Jenetta Downer .  Today patient denies fever, chills, malaise, chest pain, shortness of breath.   Has upcoming appointment with surgeon tomorrow.  Hospital discharge summary, labs imaging studies were reviewed by me today. He was encouraged to maintain close follow-up with his ENT and oncologist

## 2022-08-18 NOTE — Patient Instructions (Addendum)
[facility] [phone] Please call this office to schedule a follow up visit.   Please follow up with all your specialists as discussed and continue your current medications    It is important that you exercise regularly at least 30 minutes 5 times a week as tolerated  Think about what you will eat, plan ahead. Choose " clean, green, fresh or frozen" over canned, processed or packaged foods which are more sugary, salty and fatty. 70 to 75% of food eaten should be vegetables and fruit. Three meals at set times with snacks allowed between meals, but they must be fruit or vegetables. Aim to eat over a 12 hour period , example 7 am to 7 pm, and STOP after  your last meal of the day. Drink water,generally about 64 ounces per day, no other drink is as healthy. Fruit juice is best enjoyed in a healthy way, by EATING the fruit.  Thanks for choosing [facility] we consider it a privelige to serve you.

## 2022-08-18 NOTE — Assessment & Plan Note (Signed)
Lab Results  Component Value Date   HGBA1C 5.3 08/18/2022

## 2022-08-18 NOTE — Assessment & Plan Note (Signed)
former cigarette smoker Smoked 1 pack of cigarettes daily for 15 to 16 years.  Quit in November 2023.  He currently denies shortness of breath, wheezing

## 2022-08-18 NOTE — Assessment & Plan Note (Addendum)
He was recently admitted at [facility] for 08/07/2022 to 08/14/2018 for for a total laryngectomy and selective neck dissection with Dobbhoff tube placement.  He is currently n.p.o. has esophagram planned for 124.  He is on Osmolite 1.5 at 240 mill 6 times daily to provide nutrition.  Free water 90 minutes before and after each bolus.  Has upcoming appointment with surgeon tomorrow, maintain close follow-up with ENT and oncology Take Celebrex 200 mg twice daily and oxycodone 5 mg every 4 hours as needed for pain

## 2022-08-18 NOTE — Assessment & Plan Note (Signed)
S/p laryngectomy for malignant neoplasm of larynx.  Site clean and dry no signs of infection noted.

## 2022-08-19 DIAGNOSIS — R490 Dysphonia: Secondary | ICD-10-CM | POA: Diagnosis not present

## 2022-08-19 DIAGNOSIS — C329 Malignant neoplasm of larynx, unspecified: Secondary | ICD-10-CM | POA: Diagnosis not present

## 2022-08-19 DIAGNOSIS — Z9002 Acquired absence of larynx: Secondary | ICD-10-CM | POA: Diagnosis not present

## 2022-08-27 DIAGNOSIS — Z430 Encounter for attention to tracheostomy: Secondary | ICD-10-CM | POA: Diagnosis not present

## 2022-09-02 DIAGNOSIS — Z483 Aftercare following surgery for neoplasm: Secondary | ICD-10-CM | POA: Diagnosis not present

## 2022-09-02 DIAGNOSIS — F17210 Nicotine dependence, cigarettes, uncomplicated: Secondary | ICD-10-CM | POA: Diagnosis not present

## 2022-09-02 DIAGNOSIS — Z9089 Acquired absence of other organs: Secondary | ICD-10-CM | POA: Diagnosis not present

## 2022-09-02 DIAGNOSIS — C329 Malignant neoplasm of larynx, unspecified: Secondary | ICD-10-CM | POA: Diagnosis not present

## 2022-09-03 ENCOUNTER — Ambulatory Visit: Payer: Medicaid Other | Admitting: Podiatry

## 2022-09-11 ENCOUNTER — Ambulatory Visit: Payer: Medicaid Other | Admitting: Podiatry

## 2022-09-11 ENCOUNTER — Encounter: Payer: Self-pay | Admitting: Podiatry

## 2022-09-11 DIAGNOSIS — M21622 Bunionette of left foot: Secondary | ICD-10-CM

## 2022-09-11 DIAGNOSIS — M7752 Other enthesopathy of left foot: Secondary | ICD-10-CM | POA: Diagnosis not present

## 2022-09-11 MED ORDER — TRIAMCINOLONE ACETONIDE 10 MG/ML IJ SUSP
10.0000 mg | Freq: Once | INTRAMUSCULAR | Status: AC
  Administered 2022-09-11: 10 mg

## 2022-09-11 NOTE — Progress Notes (Signed)
Subjective:   Patient ID: Daniel Marshall, male   DOB: 47 y.o.   MRN: [id number]   HPI Patient presents showing me that he has a lot of pain in the outside of his left foot and does have hearing impairment but is able to communicate.  Patient is not smoking currently tries to be active   Review of Systems  All other systems reviewed and are negative.       Objective:  Physical Exam Vitals and nursing note reviewed.  Constitutional:      Appearance: He is well-developed.  Pulmonary:     Effort: Pulmonary effort is normal.  Musculoskeletal:        General: Normal range of motion.  Skin:    General: Skin is warm.  Neurological:     Mental Status: He is alert.     Neurovascular status intact muscle strength was found to be adequate range of motion adequate patient found to have keratotic lesion with inflammation around the head of the fifth metatarsal left fluid buildup with pain and also was noted to have thick yellow brittle nailbeds 1-5 both feet he cannot take care of     Assessment:  Inflammatory capsulitis around the fifth MPJ left fluid buildup lesion formation nail disease    Plan:  H&P condition reviewed and went ahead today did sterile prep injected the fifth MPJ 3 mg Dexasone Kenalog 5 mg Xylocaine and I did courtesy debridement of the nailbeds.  Reappoint as needed  X-rays do indicate it appears to be around the fifth metatarsal head left no other pathology noted

## 2022-09-25 DIAGNOSIS — Z430 Encounter for attention to tracheostomy: Secondary | ICD-10-CM | POA: Diagnosis not present

## 2022-10-05 ENCOUNTER — Ambulatory Visit: Payer: Self-pay | Admitting: Nurse Practitioner

## 2022-10-12 ENCOUNTER — Encounter: Payer: Self-pay | Admitting: Nurse Practitioner

## 2022-10-12 ENCOUNTER — Ambulatory Visit: Payer: Medicaid Other | Admitting: Nurse Practitioner

## 2022-10-12 VITALS — BP 160/81 | HR 93 | Ht 66.5 in | Wt 169.4 lb

## 2022-10-12 DIAGNOSIS — C329 Malignant neoplasm of larynx, unspecified: Secondary | ICD-10-CM | POA: Diagnosis not present

## 2022-10-12 DIAGNOSIS — I10 Essential (primary) hypertension: Secondary | ICD-10-CM | POA: Diagnosis not present

## 2022-10-12 MED ORDER — BLOOD PRESSURE MONITOR AUTOMAT DEVI: 0 refills | Status: AC

## 2022-10-12 MED ORDER — LOSARTAN POTASSIUM 50 MG PO TABS: 50.0000 mg | ORAL_TABLET | Freq: Every day | ORAL | 0 refills | Status: AC

## 2022-10-12 NOTE — Assessment & Plan Note (Signed)
Neck does not appear swollen on examination today Tracheostomy clean and intact Patient was encouraged to keep her upcoming appointment with ENT. No sign of distress noted on examination today

## 2022-10-12 NOTE — Patient Instructions (Signed)
1. Primary hypertension  - losartan (COZAAR) 50 MG tablet; Take 1 tablet (50 mg total) by mouth daily.  Dispense: 90 tablet; Refill: 0  Blood pressure goal is less than 140/90  Around 3 times per week, check your blood pressure 2 times per day. once in the morning and once in the evening. The readings should be at least one minute apart. Write down these values and bring them to your next nurse visit/appointment.  When you check your BP, make sure you have been doing something calm/relaxing 5 minutes prior to checking. Both feet should be flat on the floor and you should be sitting. Use your left arm and make sure it is in a relaxed position (on a table), and that the cuff is at the approximate level/height of your heart.   2. Malignant neoplasm of larynx (HCC)      It is important that you exercise regularly at least 30 minutes 5 times a week as tolerated  Think about what you will eat, plan ahead. Choose " clean, green, fresh or frozen" over canned, processed or packaged foods which are more sugary, salty and fatty. 70 to 75% of food eaten should be vegetables and fruit. Three meals at set times with snacks allowed between meals, but they must be fruit or vegetables. Aim to eat over a 12 hour period , example 7 am to 7 pm, and STOP after  your last meal of the day. Drink water,generally about 64 ounces per day, no other drink is as healthy. Fruit juice is best enjoyed in a healthy way, by EATING the fruit.  Thanks for choosing [facility] we consider it a privelige to serve you.

## 2022-10-12 NOTE — Progress Notes (Signed)
Acute Office Visit  Subjective:     Patient ID: Daniel Marshall, male    DOB: 1975/12/18, 47 y.o.   MRN: [id number]  Chief Complaint  Patient presents with   Follow-up    Neck swelling     HPI Daniel Marshall  has a past medical history of Asthma, Asthma, Broken ribs, Cancer ([place]), COPD (chronic obstructive pulmonary disease) ([place]), Dyspnea, Hypertension, Hypertension, Malignant neoplasm of larynx ([place]), and Tracheostomy dependence ([place]).  Patient presents with complaints of swelling in the neck that has not resolved since he had surgery on 08/07/2022.  He missed his last appointment with ENT.  The appointment was rescheduled for April 26, there is a plan to get PET, CT in middle of April.  Patient denies neck pain, trouble swallowing, shortness of breath, fever, chills, malaise.  Has tracheostomy capped  Patient was able to communicate by writing.   Review of Systems  Constitutional: Negative.   HENT:  Negative for ear discharge, ear pain, hearing loss, nosebleeds and tinnitus.   Respiratory:  Positive for cough. Negative for hemoptysis, sputum production, shortness of breath and wheezing.   Cardiovascular: Negative.   Gastrointestinal: Negative.   Genitourinary: Negative.   Neurological: Negative.   Psychiatric/Behavioral: Negative.          Objective:    BP (!) 160/81   Pulse 93   Ht 5' 6.5" (1.689 m)   Wt 169 lb 6.4 oz (76.8 kg)   SpO2 99%   BMI 26.93 kg/m    Physical Exam Constitutional:      General: He is not in acute distress.    Appearance: Normal appearance. He is not ill-appearing, toxic-appearing or diaphoretic.  Eyes:     General: No scleral icterus.       Right eye: No discharge.        Left eye: No discharge.  Cardiovascular:     Rate and Rhythm: Normal rate and regular rhythm.     Pulses: Normal pulses.     Heart sounds: Normal heart sounds. No murmur heard.    No friction rub. No gallop.  Pulmonary:     Effort: No respiratory distress.     Breath  sounds: No stridor. No wheezing, rhonchi or rales.     Comments: Tracheostomy capped Chest:     Chest wall: No tenderness.  Abdominal:     General: There is no distension.     Palpations: Abdomen is soft. There is no mass.     Tenderness: There is no abdominal tenderness.  Musculoskeletal:        General: No tenderness or deformity.     Cervical back: Neck supple. No rigidity or tenderness.     Right lower leg: No edema.     Left lower leg: No edema.  Lymphadenopathy:     Cervical: No cervical adenopathy.  Neurological:     Mental Status: He is alert and oriented to person, place, and time.     Motor: No weakness.     Coordination: Coordination normal.     Gait: Gait normal.  Psychiatric:        Mood and Affect: Mood normal.        Behavior: Behavior normal.        Thought Content: Thought content normal.        Judgment: Judgment normal.     No results found for any visits on 10/12/22.      Assessment & Plan:   Problem List Items Addressed This  Visit       Cardiovascular and Mediastinum   High blood pressure - Primary    BP Readings from Last 3 Encounters:  10/12/22 (!) 160/81  08/18/22 (!) 119/55  07/06/22 (!) 147/83  Patient reports that he has been taking losartan 50 mg daily, but the medication was last dispensed in December for 30 days per review of his chart, I refilled medication today, patient will follow-up in 2 weeks for BP check  Discussed DASH diet and dietary sodium restrictions Continue to increase dietary efforts and exercise.         Relevant Medications   losartan (COZAAR) 50 MG tablet   Other Relevant Orders   Recheck vitals     Respiratory   Malignant neoplasm of larynx (HCC)    Neck does not appear swollen on examination today Tracheostomy clean and intact Patient was encouraged to keep her upcoming appointment with ENT. No sign of distress noted on examination today       Meds ordered this encounter  Medications   losartan  (COZAAR) 50 MG tablet    Sig: Take 1 tablet (50 mg total) by mouth daily.    Dispense:  90 tablet    Refill:  0   Blood Pressure Monitoring (BLOOD PRESSURE MONITOR AUTOMAT) DEVI    Sig: Please check your blood pressure daily.    Dispense:  1 each    Refill:  0    No follow-ups on file.  Renee Rival, FNP

## 2022-10-12 NOTE — Assessment & Plan Note (Signed)
BP Readings from Last 3 Encounters:  10/12/22 (!) 160/81  08/18/22 (!) 119/55  07/06/22 (!) 147/83  Patient reports that he has been taking losartan 50 mg daily, but the medication was last dispensed in December for 30 days per review of his chart, I refilled medication today, patient will follow-up in 2 weeks for BP check  Discussed DASH diet and dietary sodium restrictions Continue to increase dietary efforts and exercise.

## 2022-10-27 ENCOUNTER — Encounter: Payer: Self-pay | Admitting: Pharmacist

## 2022-10-29 DIAGNOSIS — Z430 Encounter for attention to tracheostomy: Secondary | ICD-10-CM | POA: Diagnosis not present

## 2022-11-03 ENCOUNTER — Ambulatory Visit: Payer: Medicaid Other | Admitting: Nurse Practitioner

## 2022-11-17 ENCOUNTER — Ambulatory Visit: Payer: Self-pay | Admitting: Nurse Practitioner

## 2023-01-14 ENCOUNTER — Ambulatory Visit: Payer: Medicaid Other | Admitting: Podiatry

## 2023-01-18 ENCOUNTER — Ambulatory Visit: Payer: Medicaid Other | Admitting: Podiatry

## 2023-02-25 DEATH — deceased

## 2023-04-15 ENCOUNTER — Telehealth: Payer: Self-pay

## 2023-04-15 NOTE — Patient Outreach (Signed)
Care Guide Note  04/15/2023 Name: Daniel Marshall MRN: [id number] DOB: 06/21/1976  Referred by: Donell Beers, FNP Reason for referral : patient outreach (Outreach to schedule with RPH.)   Daniel Marshall is a 47 y.o. year old male who is a primary care patient of Donell Beers, FNP. Daniel Marshall was referred to the pharmacist for assistance related to HTN.    An unsuccessful telephone outreach was attempted today to contact the patient who was referred to the pharmacy team for assistance with HTN. Additional attempts will be made to contact the patient.   Daniel Marshall [facility] Assistant-Population Health [phone]

## 2023-12-13 IMAGING — CR DG CHEST 2V
2 series · 2 of 2 positions shown · non-contrast
Comparison: 05/05/2018

CLINICAL DATA: Shortness of breath

EXAM:
CHEST - 2 VIEW

[w chest pa]
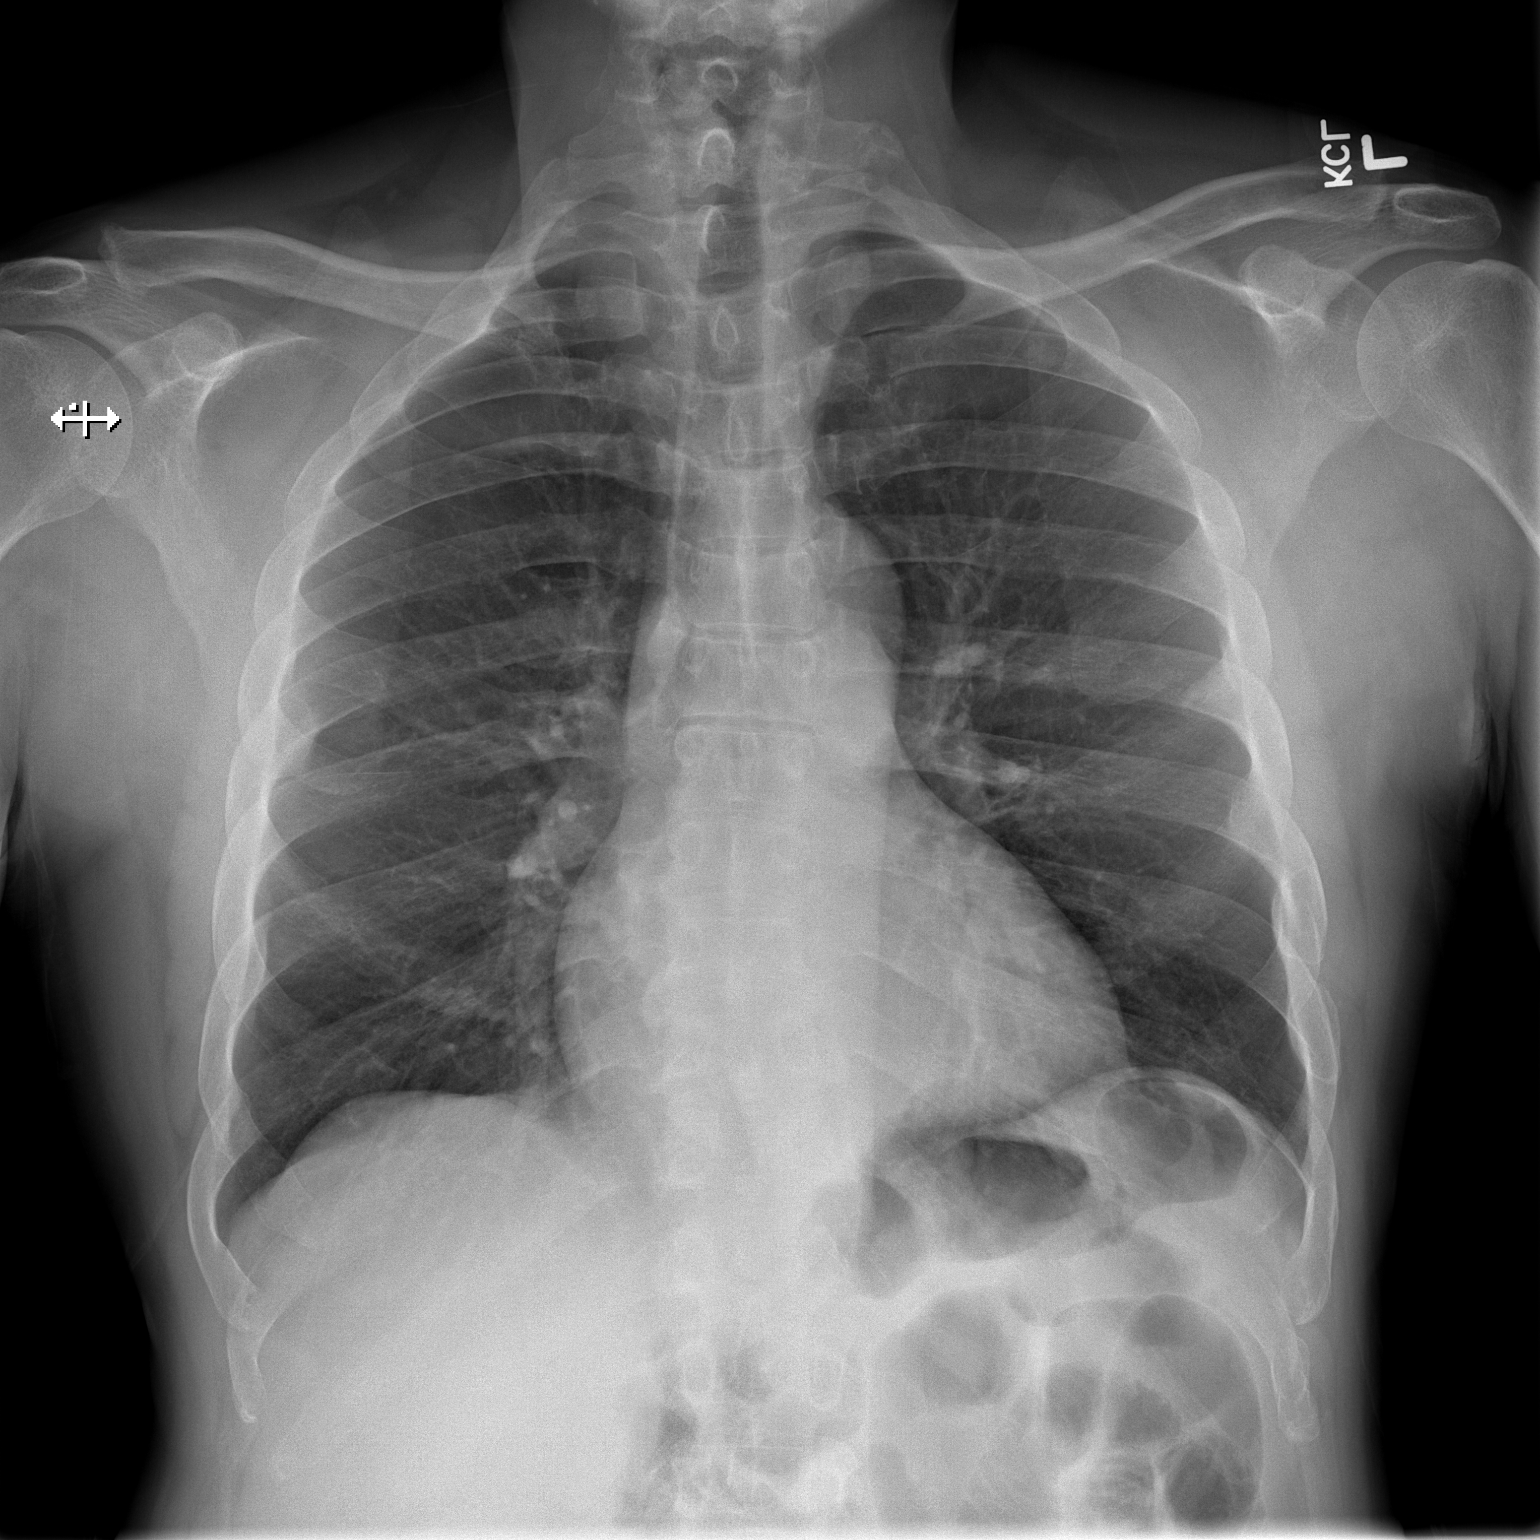

[w chest lat]
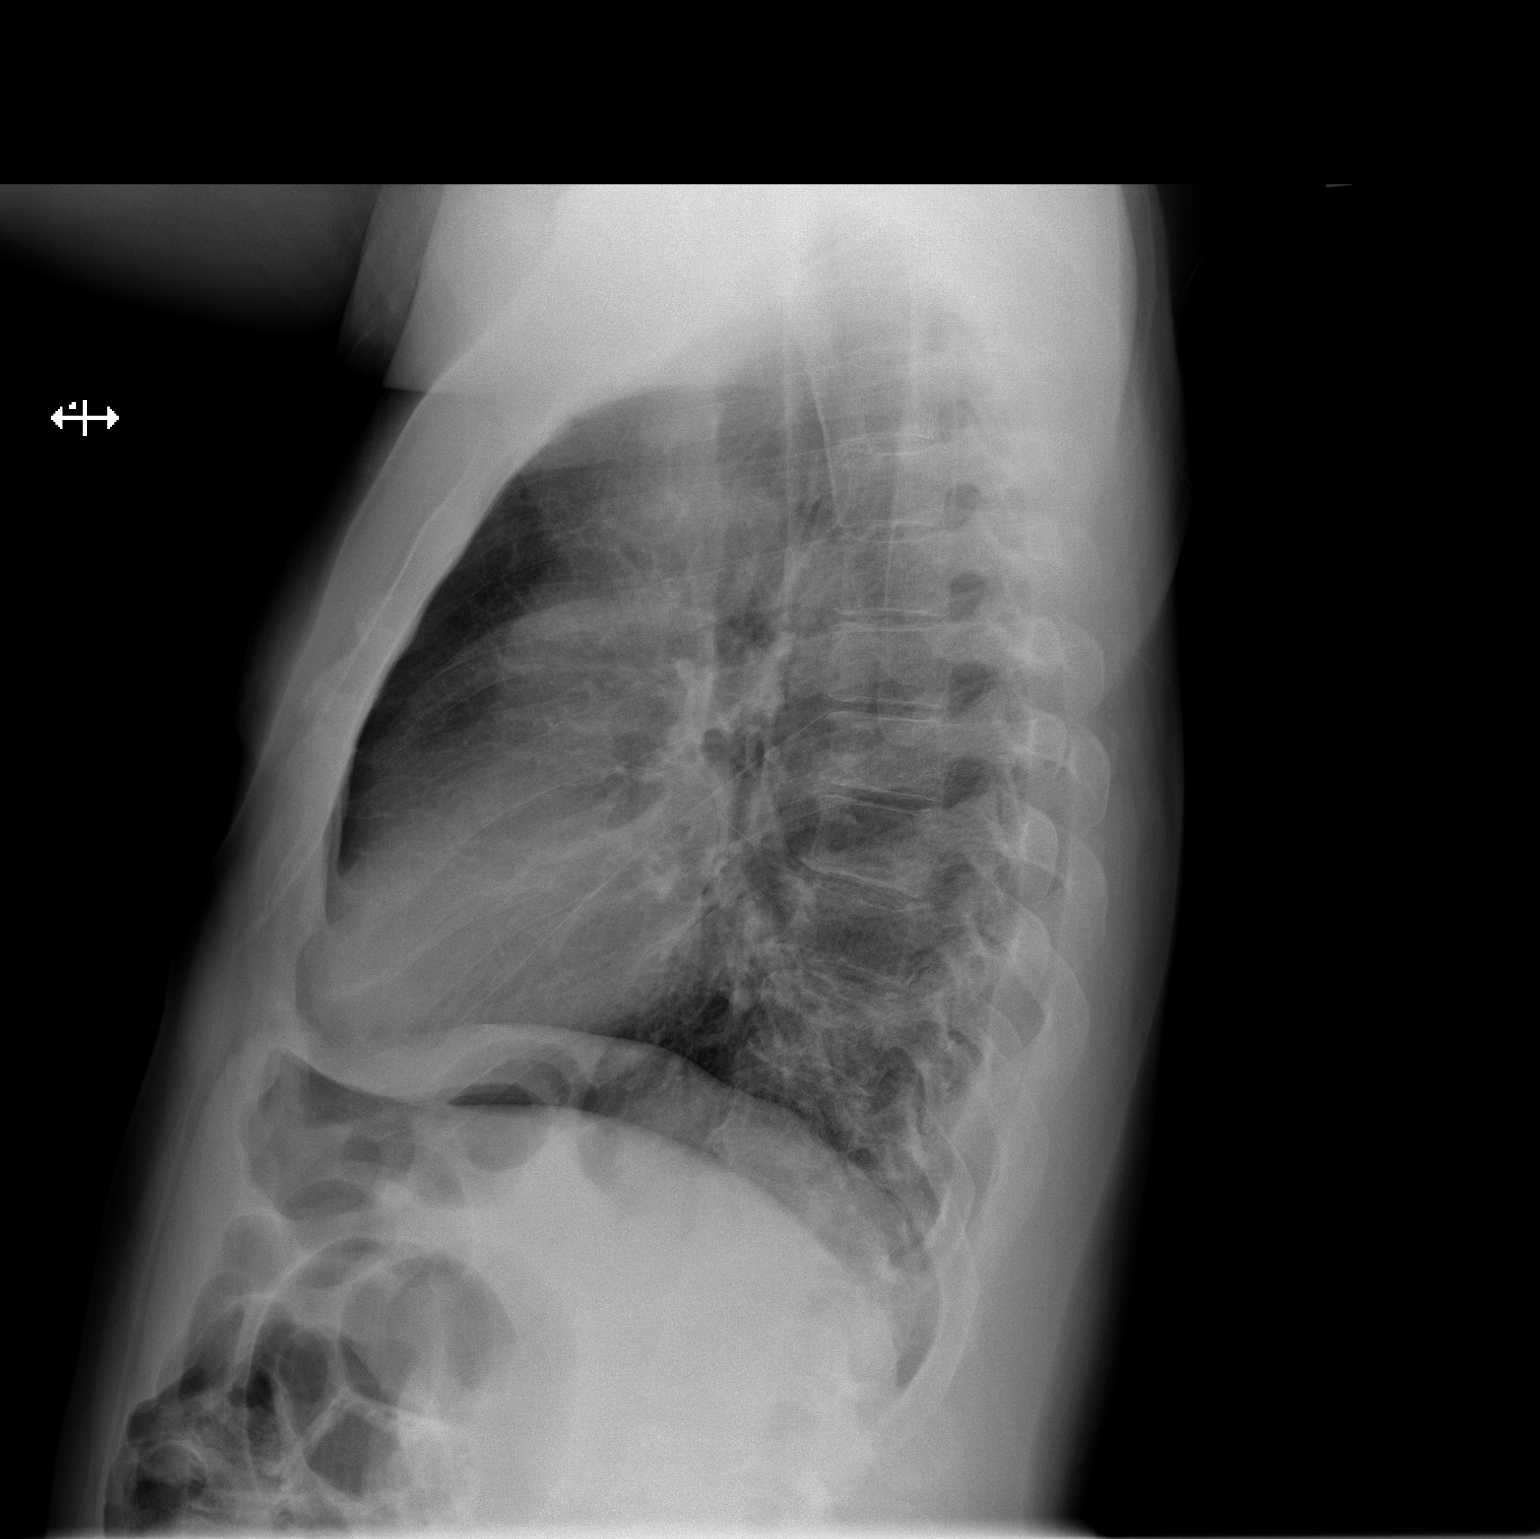

[2 of 2 positions shown; findings below may reference images not displayed]

FINDINGS: The heart size and mediastinal contours are within normal limits.
Both lungs are clear. The visualized skeletal structures are
unremarkable.
IMPRESSION: Normal study.

## 2023-12-13 IMAGING — CT CT ANGIO CHEST
2 of 7 series · 17 of 46 positions shown · IV contrast (APPLIED)
Comparison: CT neck today reported separately. CTA chest
03/07/2018.

CLINICAL DATA: 45-year-old male with shortness of breath and
hoarseness.

EXAM:
CT ANGIOGRAPHY CHEST WITH CONTRAST
TECHNIQUE: Multidetector CT imaging of the chest was performed using the
standard protocol during bolus administration of intravenous
contrast. Multiplanar CT image reconstructions and MIPs were
obtained to evaluate the vascular anatomy.

[Series 3: thins · axial · 0.77mm/px · z∈[-526,-246]mm · 15 of 322 slices shown]
[im 21/322  lung]
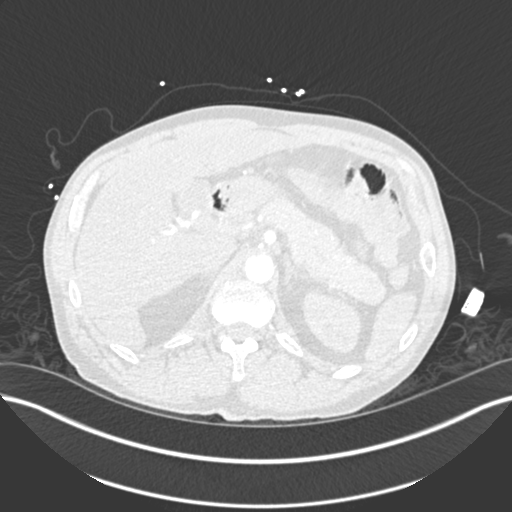
[im 41/322  soft-tissue]
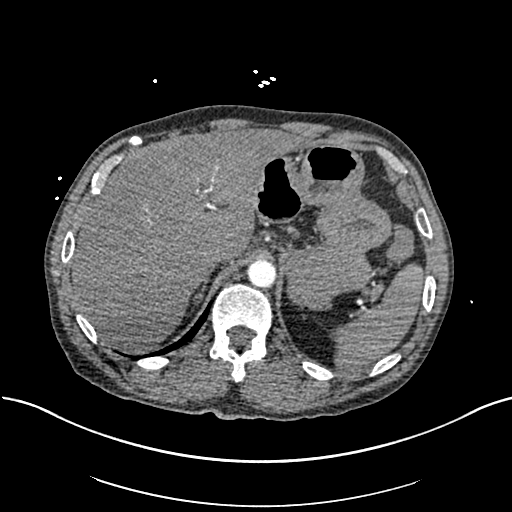
[im 61/322  lung]
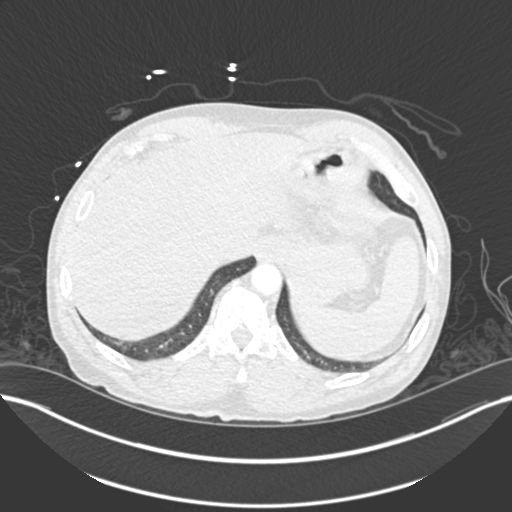
[im 81/322  soft-tissue]
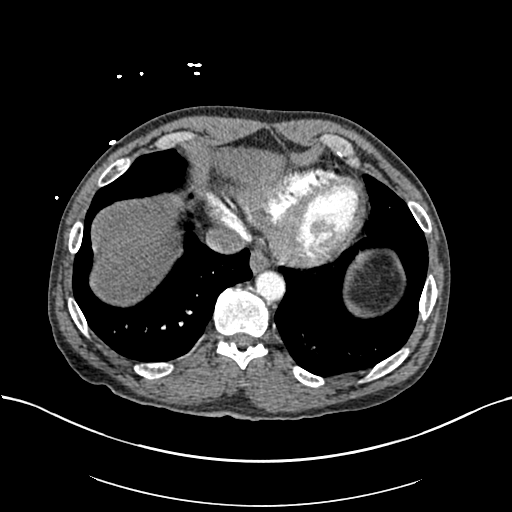
[im 101/322  lung]
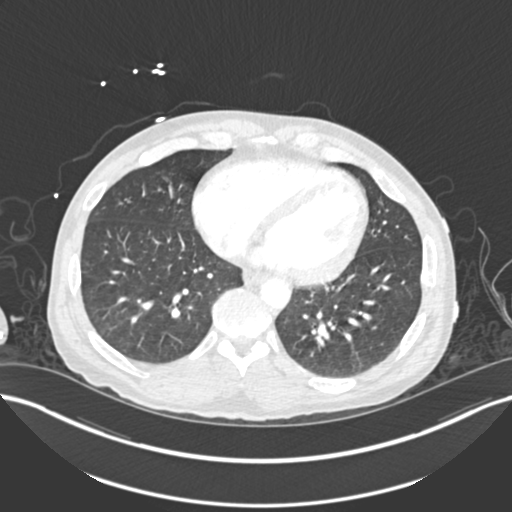
[im 121/322  soft-tissue]
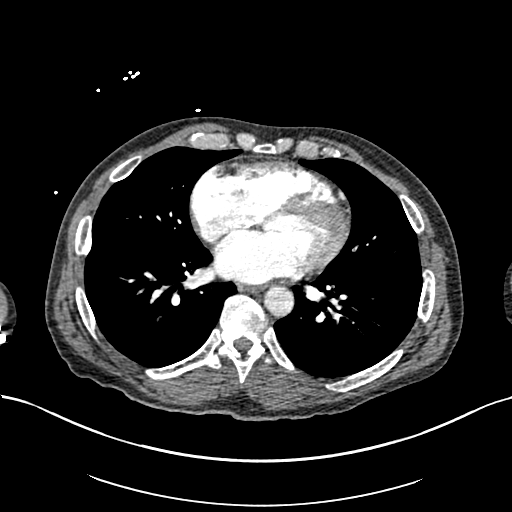
[im 141/322  lung]
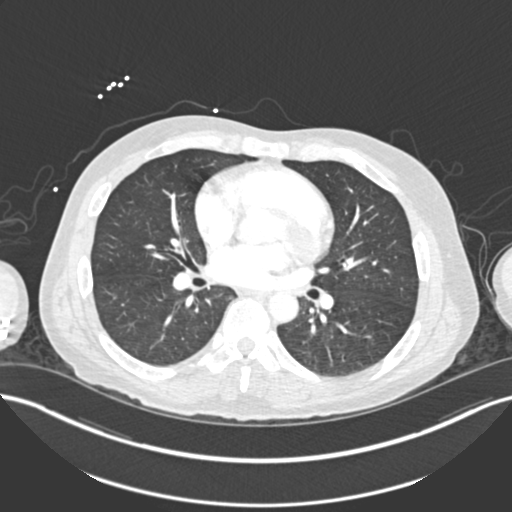
[im 161/322  soft-tissue]
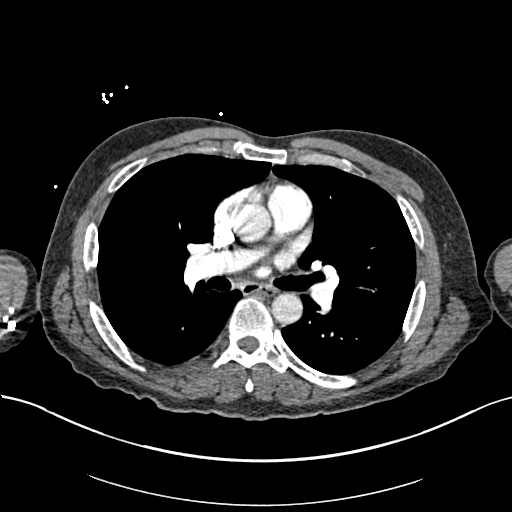
[im 181/322  lung]
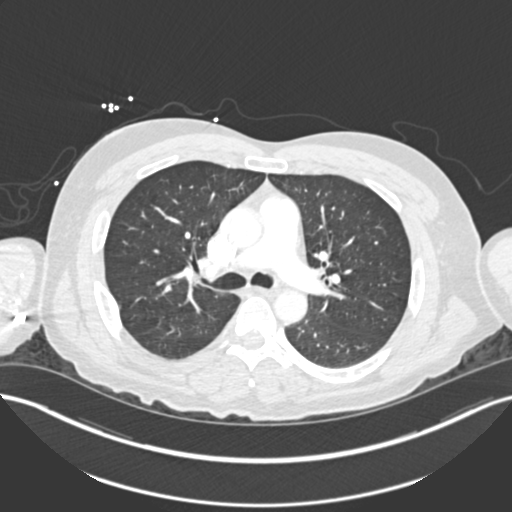
[im 201/322  soft-tissue]
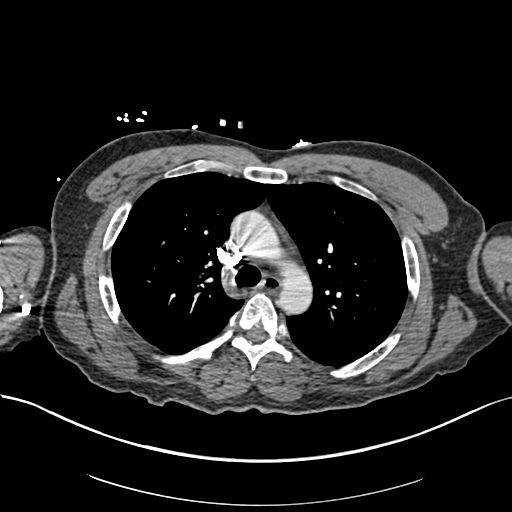
[im 221/322  lung]
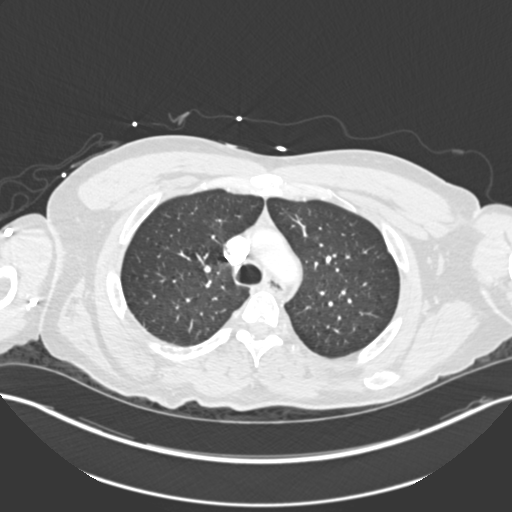
[im 241/322  soft-tissue]
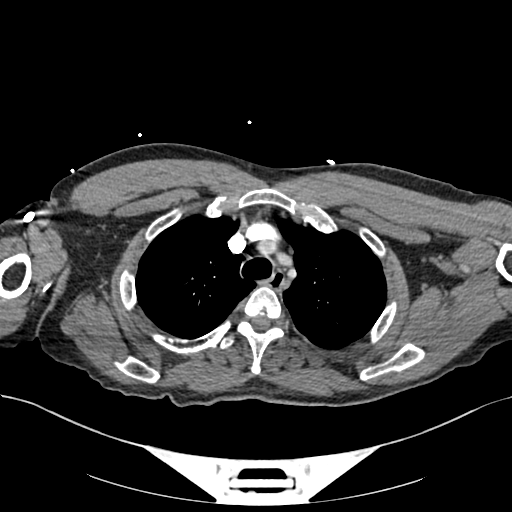
[im 261/322  lung]
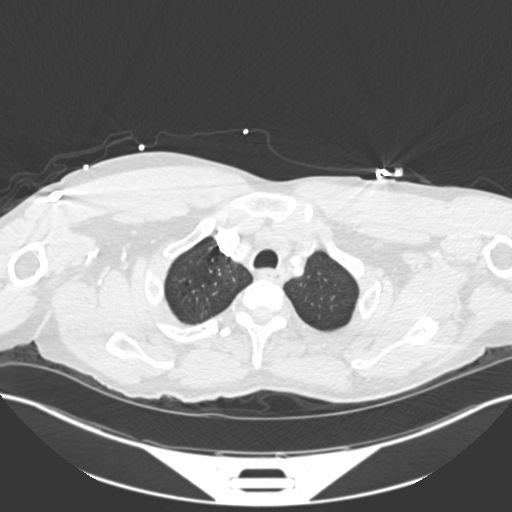
[im 281/322  soft-tissue]
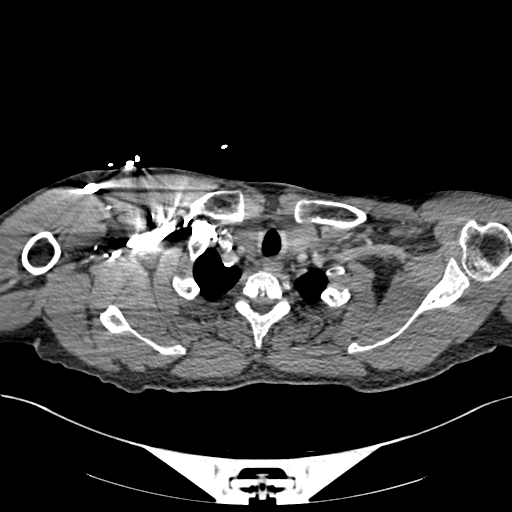
[im 301/322  lung]
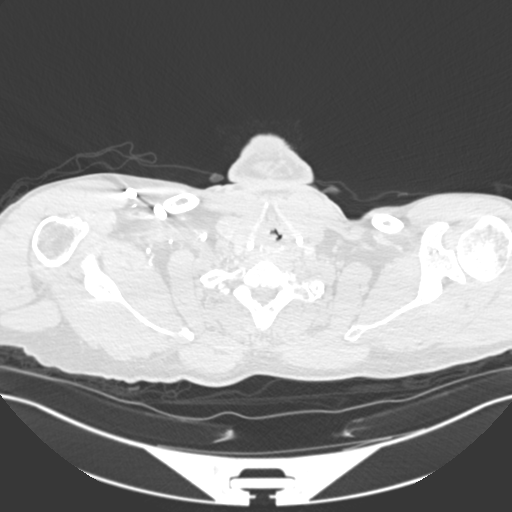

[Series 5: coronal mpr · coronal · 0.64mm/px · 2 of 82 slices shown]
[im 28/82  soft-tissue]
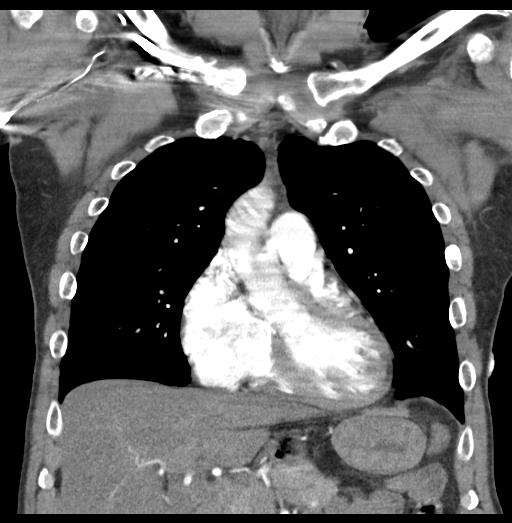
[im 55/82  soft-tissue]
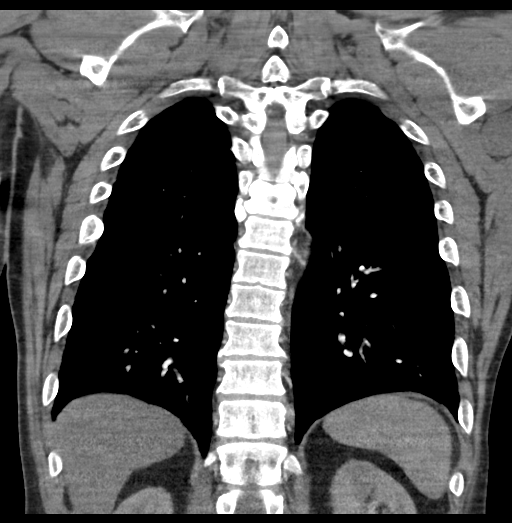

[17 of 46 positions shown; findings below may reference images not displayed]

RADIATION DOSE REDUCTION: This exam was performed according to the
departmental dose-optimization program which includes automated
exposure control, adjustment of the mA and/or kV according to
patient size and/or use of iterative reconstruction technique.

CONTRAST:  80mL OMNIPAQUE IOHEXOL 350 MG/ML SOLN
FINDINGS: Cardiovascular: Adequate contrast bolus timing in the pulmonary
arterial tree. Intermittent respiratory motion. No pulmonary artery
filling defect identified. No cardiomegaly or pericardial effusion.
Negative visible aorta, 4 vessel arch configuration with the left
vertebral artery arising directly from the arch (normal variant).
Questionable left coronary artery calcified plaque on series 3,
image 168.

Mediastinum/Nodes: Asymmetry of the larynx and right laryngeal
ventricle, see neck CT reported separately. No visible laryngeal or
thoracic inlet mass identified on these images. No mediastinal mass
or lymphadenopathy identified. No axillary lymphadenopathy.

Lungs/Pleura: Subglottic trachea and major airways appear patent and
within normal limits. Mild respiratory motion. Centrilobular
emphysema suspected. 5 mm left upper lobe lung nodule on series 4,
image 35 is new since 8275. And patchy subpleural opacity in the
lateral segment of the right middle lobe is new on series 4, image
104 but sub solid. Minor atelectasis in the costophrenic angles is
stable. No pleural effusion or other abnormal pulmonary opacity.

Upper Abdomen: Negative visible mostly noncontrast liver,
gallbladder, spleen, pancreas, adrenal glands, kidneys, and bowel in
the upper abdomen.

Musculoskeletal: Chronic bilateral rib fractures, some on the left
were acute in 8275. No acute or suspicious osseous lesion
identified.

Review of the MIP images confirms the above findings.
IMPRESSION: 1. No acute pulmonary embolus identified.

2. Asymmetric larynx, see Neck CT today reported separately. No
thoracic inlet or mediastinal mass or abnormality identified.

3. Emphysema ([id number]). A 5 mm left upper lobe lung nodule is
new since 8275. As is a small area of patchy
inflammatory/postinflammatory appearing opacity in the right middle
lobe.
A non-contrast Chest CT at 12 months is optional. If performed and
the nodule is stable at 12 months, no further follow-up is
recommended.
These guidelines do not apply to immunocompromised patients and
patients with cancer. Follow up in patients with significant
comorbidities as clinically warranted. For lung cancer screening,
adhere to Lung-RADS guidelines. Reference: Radiology. 8380;

## 2023-12-13 IMAGING — CT CT NECK W/ CM
3 of 5 series · 11 of 33 positions shown, 13 images · IV contrast (agent unspecified)
Comparison: CTA chest today reported separately. Chest radiographs
0990 hours today.

CLINICAL DATA: 45-year-old male with shortness of breath and
hoarseness.

EXAM:
CT NECK WITH CONTRAST
TECHNIQUE: Multidetector CT imaging of the neck was performed using the
standard protocol following the bolus administration of intravenous
contrast.

[Series 7: orthogonal (person_name) · axial · 0.35mm/px · z∈[-339,-184]mm · 3 of 160 slices shown, 4 images]
[im 40/160  soft-tissue]
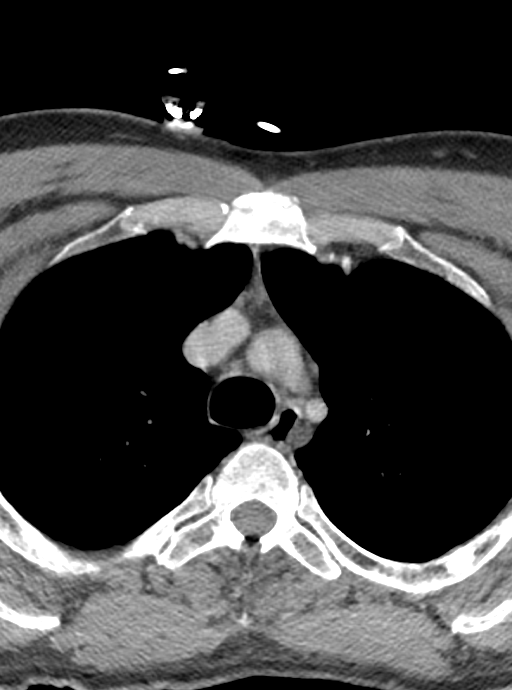
[im 40/160  bone]
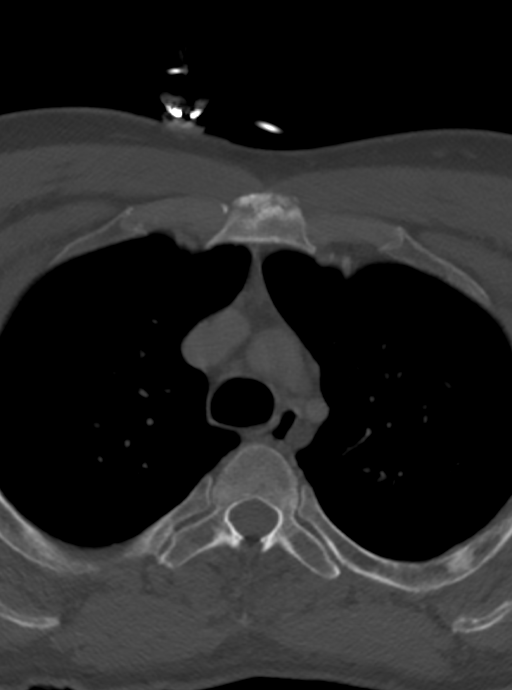
[im 80/160  bone]
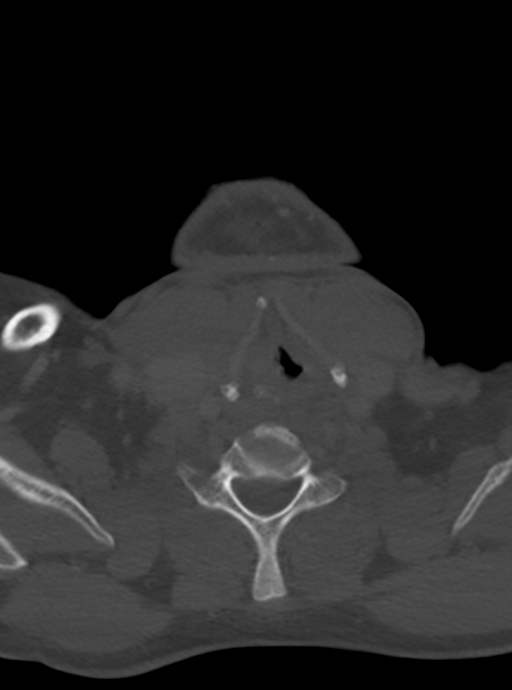
[im 120/160  bone]
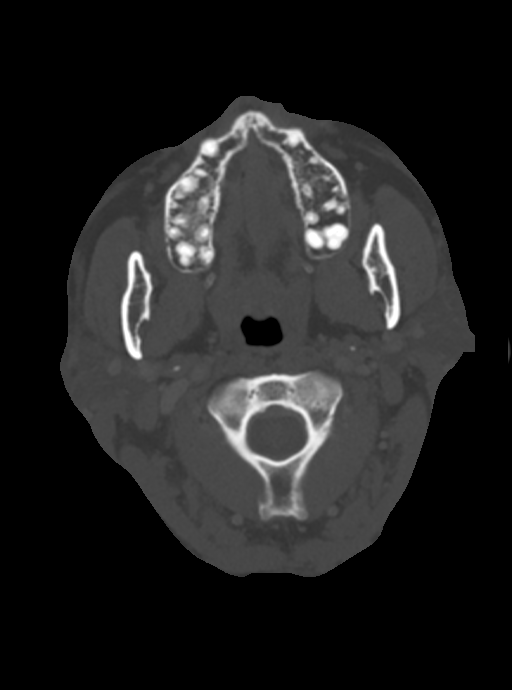

[Series 8: cor neck · coronal · 0.35mm/px · 3 of 123 slices shown]
[im 30/123  bone]
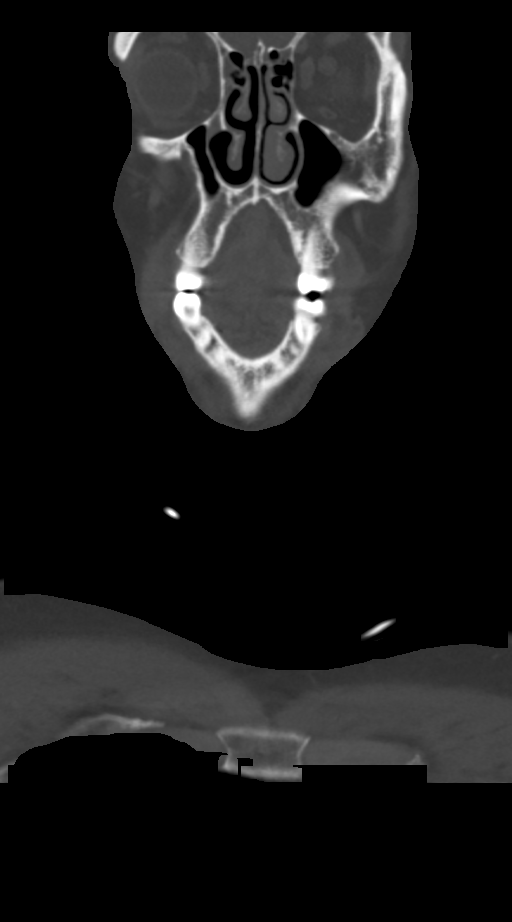
[im 51/123  bone]
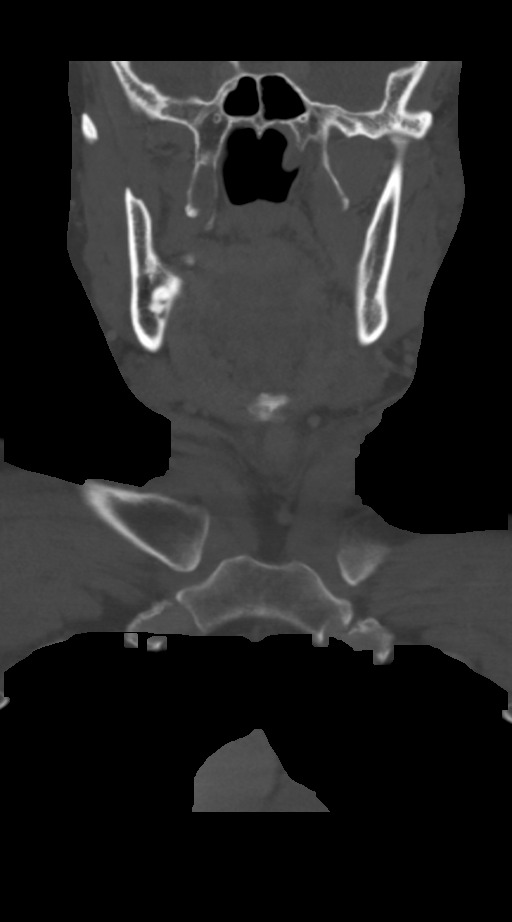
[im 72/123  bone]
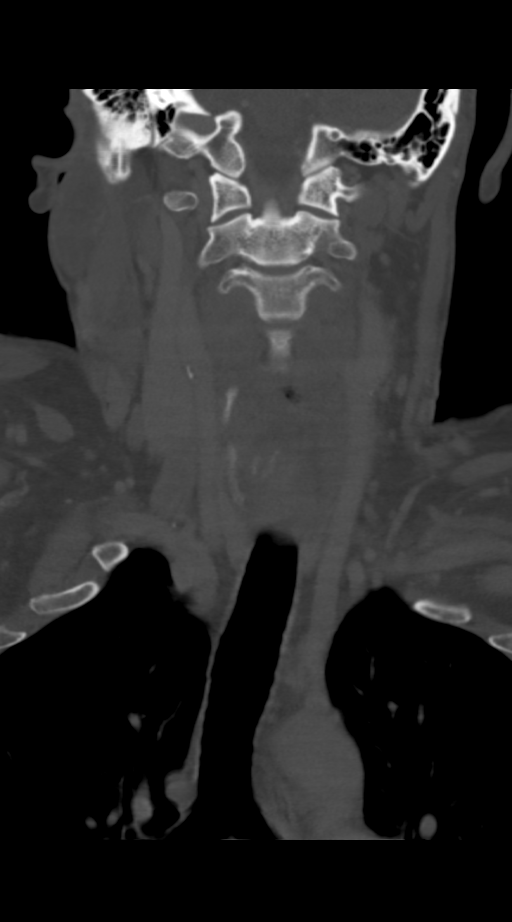

[Series 9: sag neck · sagittal · 0.48mm/px · 5 of 91 slices shown, 6 images]
[im 31/91  bone]
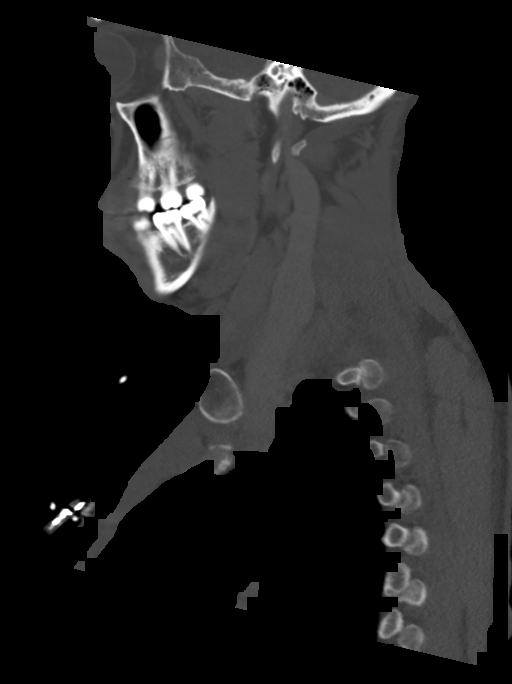
[im 38/91  bone]
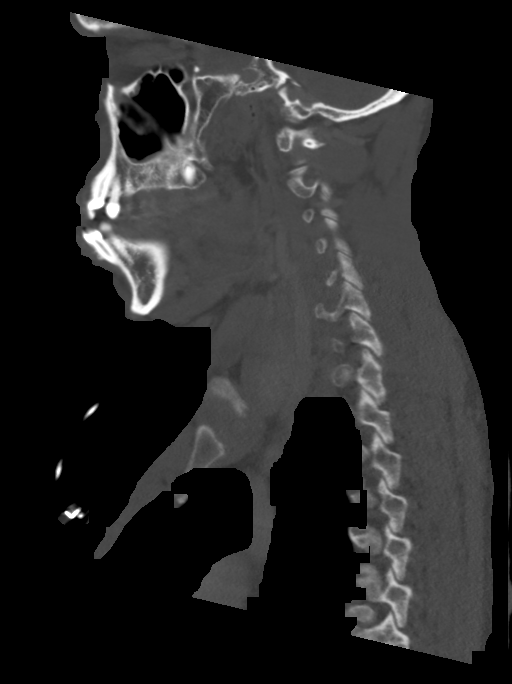
[im 46/91  soft-tissue]
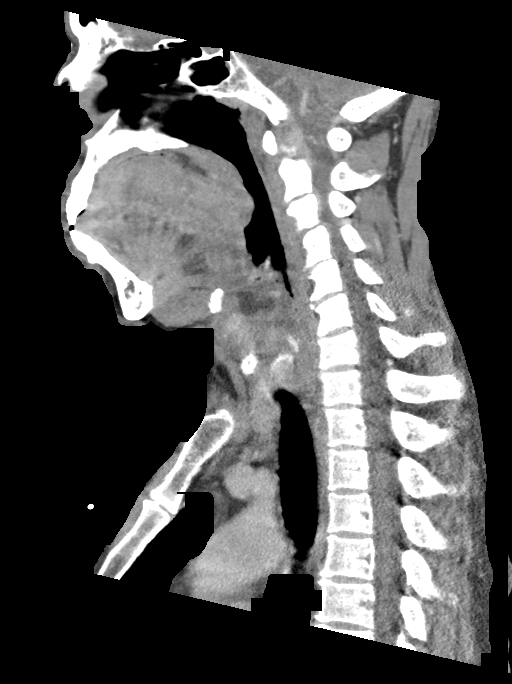
[im 46/91  bone]
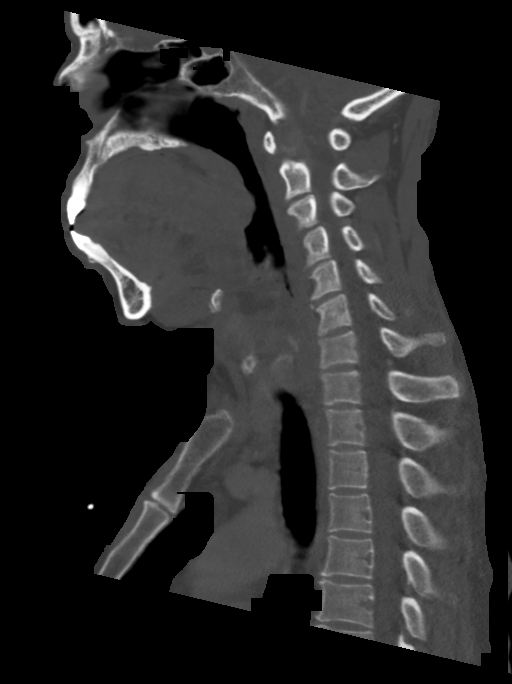
[im 53/91  bone]
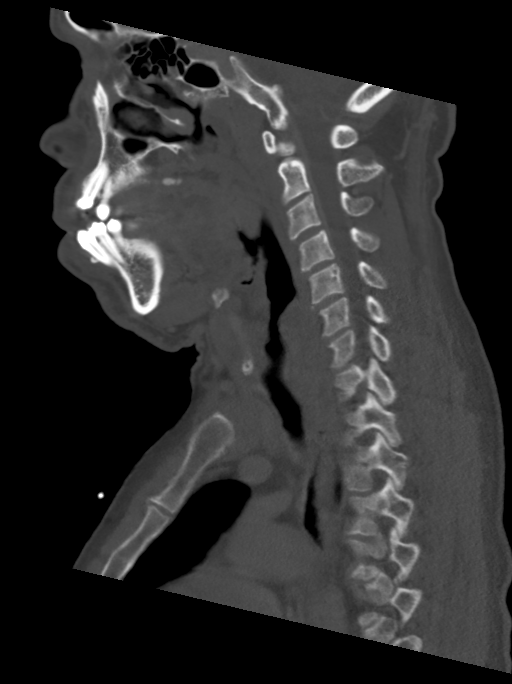
[im 61/91  bone]
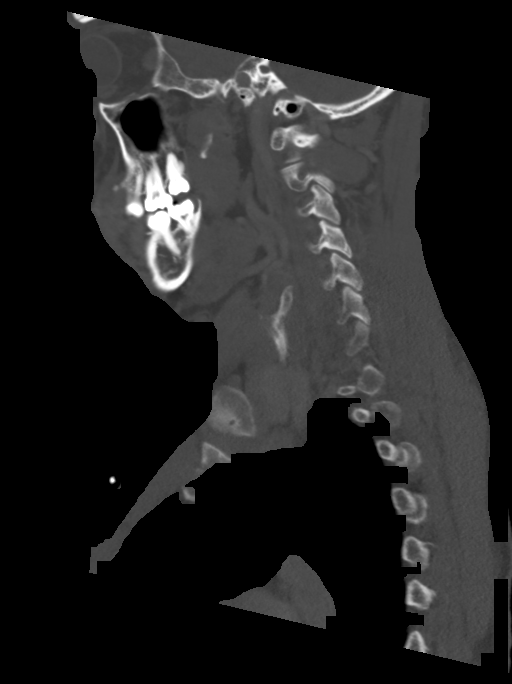

[11 of 33 positions shown; findings below may reference images not displayed]

RADIATION DOSE REDUCTION: This exam was performed according to the
departmental dose-optimization program which includes automated
exposure control, adjustment of the mA and/or kV according to
patient size and/or use of iterative reconstruction technique.

CONTRAST:  80mL OMNIPAQUE IOHEXOL 350 MG/ML SOLN
FINDINGS: Pharynx and larynx: Mild motion artifact at both the larynx and
hypopharynx. Asymmetric enlargement of the right laryngeal ventricle
on series 4, image 67. But the true cords appear to remain fairly
symmetric. There is no discrete laryngeal mass or hyperenhancement
identified. Motion artifact at the epiglottis which seems to remain
normal.

Oropharyngeal soft tissue contours appear symmetric and within
normal limits. Negative parapharyngeal and retropharyngeal spaces.

Salivary glands: Negative.  Negative sublingual space.

Thyroid: Negative.  No thoracic inlet mass identified.

Lymph nodes: Negative. Bilateral cervical lymph nodes are fairly
symmetric and remain within normal limits.

Vascular: 4 vessel arch configuration, the left vertebral artery
appears to arise directly from the arch. Major vascular structures
in the neck and at the skull base are enhancing and appear to be
patent. Carotid bifurcation atherosclerosis greater on the left. No
right carotid space mass identified.

Limited intracranial: Minimally included, negative.

Visualized orbits: Negative.

Mastoids and visualized paranasal sinuses: Scattered bilateral
ethmoid sinus mucosal thickening. Bubbly opacity in the left greater
than right sphenoid sinuses. Trace maxillary alveolar recess mucosal
thickening. Visible tympanic cavities and mastoids are clear.

Skeleton: No acute dental finding. Mild for age cervical spine
degeneration. No acute osseous abnormality identified.

Upper chest: Chest CTA today is reported separately.
IMPRESSION: 1. Mild motion artifact at the larynx with nonspecific asymmetry of
the right laryngeal ventricle. No discrete laryngeal/pharyngeal mass
or hyperenhancement.
Consider right vocal cord paralysis and recommend follow-up
Laryngoscopy.

2. Mild to moderate paranasal sinus inflammation with some bubbly
opacity suggesting acute sinusitis.

3. Otherwise negative CT appearance of the Neck. CTA chest today
reported separately.
